# Patient Record
Sex: Female | Born: 1980 | Race: Black or African American | Hispanic: No | Smoking: Never smoker
Health system: Southern US, Community
[De-identification: ages and names within clinical notes are randomized; demographics above are authoritative.]

## PROBLEM LIST (undated history)

## (undated) ENCOUNTER — Inpatient Hospital Stay (HOSPITAL_COMMUNITY): Payer: Self-pay

## (undated) DIAGNOSIS — Z6841 Body Mass Index (BMI) 40.0 and over, adult: Secondary | ICD-10-CM

## (undated) DIAGNOSIS — M545 Low back pain, unspecified: Secondary | ICD-10-CM

## (undated) DIAGNOSIS — M1990 Unspecified osteoarthritis, unspecified site: Secondary | ICD-10-CM

## (undated) DIAGNOSIS — Z3A12 12 weeks gestation of pregnancy: Secondary | ICD-10-CM

## (undated) DIAGNOSIS — R7303 Prediabetes: Secondary | ICD-10-CM

## (undated) DIAGNOSIS — G8929 Other chronic pain: Secondary | ICD-10-CM

## (undated) DIAGNOSIS — L03115 Cellulitis of right lower limb: Secondary | ICD-10-CM

## (undated) HISTORY — PX: DILATION AND CURETTAGE OF UTERUS: SHX78

## (undated) HISTORY — DX: Prediabetes: R73.03

## (undated) HISTORY — PX: TUBAL LIGATION: SHX77

## (undated) HISTORY — DX: 12 weeks gestation of pregnancy: Z3A.12

## (undated) HISTORY — DX: Cellulitis of right lower limb: L03.115

---
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
## 1999-05-18 ENCOUNTER — Emergency Department (HOSPITAL_COMMUNITY): Admission: EM | Admit: 1999-05-18 | Discharge: 1999-05-18 | Payer: Self-pay | Admitting: *Deleted

## 1999-05-18 ENCOUNTER — Encounter: Payer: Self-pay | Admitting: *Deleted

## 1999-07-22 ENCOUNTER — Emergency Department (HOSPITAL_COMMUNITY): Admission: EM | Admit: 1999-07-22 | Discharge: 1999-07-22 | Payer: Self-pay | Admitting: Emergency Medicine

## 1999-07-22 ENCOUNTER — Encounter: Payer: Self-pay | Admitting: Emergency Medicine

## 1999-09-23 ENCOUNTER — Emergency Department (HOSPITAL_COMMUNITY): Admission: EM | Admit: 1999-09-23 | Discharge: 1999-09-23 | Payer: Self-pay | Admitting: Emergency Medicine

## 2000-01-17 ENCOUNTER — Emergency Department (HOSPITAL_COMMUNITY): Admission: EM | Admit: 2000-01-17 | Discharge: 2000-01-18 | Payer: Self-pay | Admitting: Emergency Medicine

## 2000-06-18 ENCOUNTER — Emergency Department (HOSPITAL_COMMUNITY): Admission: EM | Admit: 2000-06-18 | Discharge: 2000-06-19 | Payer: Self-pay | Admitting: Emergency Medicine

## 2000-08-28 ENCOUNTER — Emergency Department (HOSPITAL_COMMUNITY): Admission: EM | Admit: 2000-08-28 | Discharge: 2000-08-29 | Payer: Self-pay | Admitting: Emergency Medicine

## 2002-02-24 ENCOUNTER — Emergency Department (HOSPITAL_COMMUNITY): Admission: EM | Admit: 2002-02-24 | Discharge: 2002-02-24 | Payer: Self-pay | Admitting: Emergency Medicine

## 2003-01-19 ENCOUNTER — Emergency Department (HOSPITAL_COMMUNITY): Admission: EM | Admit: 2003-01-19 | Discharge: 2003-01-20 | Payer: Self-pay | Admitting: Emergency Medicine

## 2006-07-20 ENCOUNTER — Emergency Department (HOSPITAL_COMMUNITY): Admission: EM | Admit: 2006-07-20 | Discharge: 2006-07-20 | Payer: Self-pay | Admitting: Emergency Medicine

## 2006-11-27 ENCOUNTER — Emergency Department (HOSPITAL_COMMUNITY): Admission: EM | Admit: 2006-11-27 | Discharge: 2006-11-27 | Payer: Self-pay | Admitting: Emergency Medicine

## 2007-11-03 ENCOUNTER — Emergency Department (HOSPITAL_COMMUNITY): Admission: EM | Admit: 2007-11-03 | Discharge: 2007-11-04 | Payer: Self-pay | Admitting: Emergency Medicine

## 2007-11-09 ENCOUNTER — Inpatient Hospital Stay (HOSPITAL_COMMUNITY): Admission: AD | Admit: 2007-11-09 | Discharge: 2007-11-09 | Payer: Self-pay | Admitting: Gynecology

## 2007-12-06 ENCOUNTER — Ambulatory Visit: Payer: Self-pay | Admitting: Obstetrics & Gynecology

## 2007-12-06 ENCOUNTER — Ambulatory Visit (HOSPITAL_COMMUNITY): Admission: AD | Admit: 2007-12-06 | Discharge: 2007-12-07 | Payer: Self-pay | Admitting: Obstetrics & Gynecology

## 2007-12-07 ENCOUNTER — Encounter: Payer: Self-pay | Admitting: Obstetrics & Gynecology

## 2007-12-10 ENCOUNTER — Encounter: Payer: Self-pay | Admitting: Obstetrics & Gynecology

## 2007-12-10 ENCOUNTER — Inpatient Hospital Stay (HOSPITAL_COMMUNITY): Admission: AD | Admit: 2007-12-10 | Discharge: 2007-12-10 | Payer: Self-pay | Admitting: Obstetrics & Gynecology

## 2008-06-19 ENCOUNTER — Inpatient Hospital Stay (HOSPITAL_COMMUNITY): Admission: AD | Admit: 2008-06-19 | Discharge: 2008-06-20 | Payer: Self-pay | Admitting: Obstetrics & Gynecology

## 2008-08-03 ENCOUNTER — Ambulatory Visit (HOSPITAL_COMMUNITY): Admission: RE | Admit: 2008-08-03 | Discharge: 2008-08-03 | Payer: Self-pay | Admitting: Obstetrics

## 2008-09-12 ENCOUNTER — Inpatient Hospital Stay (HOSPITAL_COMMUNITY): Admission: AD | Admit: 2008-09-12 | Discharge: 2008-09-12 | Payer: Self-pay | Admitting: Obstetrics

## 2008-10-14 ENCOUNTER — Ambulatory Visit (HOSPITAL_COMMUNITY): Admission: RE | Admit: 2008-10-14 | Discharge: 2008-10-14 | Payer: Self-pay | Admitting: Obstetrics

## 2008-11-04 ENCOUNTER — Ambulatory Visit (HOSPITAL_COMMUNITY): Admission: RE | Admit: 2008-11-04 | Discharge: 2008-11-04 | Payer: Self-pay | Admitting: Obstetrics

## 2008-11-18 ENCOUNTER — Ambulatory Visit: Payer: Self-pay | Admitting: Advanced Practice Midwife

## 2008-11-18 ENCOUNTER — Inpatient Hospital Stay (HOSPITAL_COMMUNITY): Admission: AD | Admit: 2008-11-18 | Discharge: 2008-11-18 | Payer: Self-pay | Admitting: Obstetrics

## 2008-12-15 ENCOUNTER — Ambulatory Visit (HOSPITAL_COMMUNITY): Admission: RE | Admit: 2008-12-15 | Discharge: 2008-12-15 | Payer: Self-pay | Admitting: Obstetrics

## 2008-12-24 ENCOUNTER — Encounter: Payer: Self-pay | Admitting: Obstetrics

## 2008-12-24 ENCOUNTER — Inpatient Hospital Stay (HOSPITAL_COMMUNITY): Admission: RE | Admit: 2008-12-24 | Discharge: 2008-12-27 | Payer: Self-pay | Admitting: Obstetrics

## 2009-01-01 ENCOUNTER — Inpatient Hospital Stay (HOSPITAL_COMMUNITY): Admission: AD | Admit: 2009-01-01 | Discharge: 2009-01-01 | Payer: Self-pay | Admitting: Obstetrics

## 2009-01-06 ENCOUNTER — Inpatient Hospital Stay (HOSPITAL_COMMUNITY): Admission: AD | Admit: 2009-01-06 | Discharge: 2009-01-14 | Payer: Self-pay | Admitting: Obstetrics

## 2009-06-11 ENCOUNTER — Other Ambulatory Visit: Payer: Self-pay | Admitting: Obstetrics & Gynecology

## 2009-06-11 ENCOUNTER — Emergency Department (HOSPITAL_COMMUNITY): Admission: EM | Admit: 2009-06-11 | Discharge: 2009-06-11 | Payer: Self-pay | Admitting: Emergency Medicine

## 2009-08-05 ENCOUNTER — Emergency Department (HOSPITAL_COMMUNITY): Admission: EM | Admit: 2009-08-05 | Discharge: 2009-08-05 | Payer: Self-pay | Admitting: Family Medicine

## 2009-11-13 ENCOUNTER — Emergency Department (HOSPITAL_COMMUNITY): Admission: EM | Admit: 2009-11-13 | Discharge: 2009-11-13 | Payer: Self-pay | Admitting: Emergency Medicine

## 2010-04-02 ENCOUNTER — Emergency Department (HOSPITAL_COMMUNITY)
Admission: EM | Admit: 2010-04-02 | Discharge: 2010-04-03 | Payer: Self-pay | Source: Home / Self Care | Admitting: Emergency Medicine

## 2010-06-16 LAB — COMPREHENSIVE METABOLIC PANEL
ALT: 13 U/L (ref 0–35)
AST: 19 U/L (ref 0–37)
Albumin: 3.7 g/dL (ref 3.5–5.2)
CO2: 27 mEq/L (ref 19–32)
Calcium: 9.2 mg/dL (ref 8.4–10.5)
Chloride: 109 mEq/L (ref 96–112)
GFR calc Af Amer: >60 mL/min (ref >60)
Glucose, Bld: 87 mg/dL (ref 70–99)
Sodium: 143 mEq/L (ref 135–145)
Total Protein: 7.4 g/dL (ref 6.0–8.3)

## 2010-06-16 LAB — URINALYSIS, ROUTINE W REFLEX MICROSCOPIC
Ketones, ur: NEGATIVE mg/dL
Leukocytes, UA: NEGATIVE
Nitrite: NEGATIVE
Protein, ur: 30 mg/dL — AB
Specific Gravity, Urine: 1.017 (ref 1.005–1.030)

## 2010-06-16 LAB — CBC
HCT: 41.6 % (ref 36.0–46.0)
Hemoglobin: 13.7 g/dL (ref 12.0–15.0)
MCH: 27.6 pg (ref 26.0–34.0)
MCHC: 32.9 g/dL (ref 30.0–36.0)
MCV: 83.9 fL (ref 78.0–100.0)
Platelets: 212 K/uL (ref 150–400)
RBC: 4.96 MIL/uL (ref 3.87–5.11)
RDW: 13.3 % (ref 11.5–15.5)
WBC: 6.4 10*3/uL (ref 4.0–10.5)

## 2010-06-16 LAB — COMPREHENSIVE METABOLIC PANEL WITH GFR
Alkaline Phosphatase: 47 U/L (ref 39–117)
BUN: 7 mg/dL (ref 6–23)
Creatinine, Ser: 1.19 mg/dL (ref 0.4–1.2)
GFR calc non Af Amer: 53 mL/min — ABNORMAL LOW (ref >60)
Potassium: 4.1 meq/L (ref 3.5–5.1)
Total Bilirubin: 0.6 mg/dL (ref 0.3–1.2)

## 2010-06-16 LAB — DIFFERENTIAL
Basophils Absolute: 0 K/uL (ref 0.0–0.1)
Basophils Relative: 0 % (ref 0–1)
Eosinophils Absolute: 0.1 K/uL (ref 0.0–0.7)
Eosinophils Relative: 1 % (ref 0–5)
Lymphocytes Relative: 29 % (ref 12–46)
Lymphs Abs: 1.8 K/uL (ref 0.7–4.0)
Monocytes Absolute: 0.4 10*3/uL (ref 0.1–1.0)
Monocytes Relative: 6 % (ref 3–12)
Neutro Abs: 4.1 K/uL (ref 1.7–7.7)
Neutrophils Relative %: 64 % (ref 43–77)

## 2010-06-16 LAB — LIPASE, BLOOD: Lipase: 25 U/L (ref 11–59)

## 2010-06-16 LAB — POCT PREGNANCY, URINE: Preg Test, Ur: NEGATIVE

## 2010-06-20 LAB — POCT URINALYSIS DIP (DEVICE)
Bilirubin Urine: NEGATIVE
Ketones, ur: NEGATIVE mg/dL
Protein, ur: 30 mg/dL — AB
Specific Gravity, Urine: 1.015 (ref 1.005–1.030)
pH: 7.5 (ref 5.0–8.0)

## 2010-06-20 LAB — POCT PREGNANCY, URINE: Preg Test, Ur: NEGATIVE

## 2010-06-23 LAB — CBC
HCT: 41.5 % (ref 36.0–46.0)
Hemoglobin: 13.1 g/dL (ref 12.0–15.0)
RBC: 4.88 MIL/uL (ref 3.87–5.11)
RDW: 13 % (ref 11.5–15.5)

## 2010-06-23 LAB — DIFFERENTIAL
Basophils Absolute: 0 10*3/uL (ref 0.0–0.1)
Eosinophils Relative: 1 % (ref 0–5)
Lymphocytes Relative: 14 % (ref 12–46)
Monocytes Absolute: 0.4 10*3/uL (ref 0.1–1.0)
Monocytes Relative: 4 % (ref 3–12)

## 2010-06-23 LAB — POCT I-STAT, CHEM 8
BUN: 7 mg/dL (ref 6–23)
Calcium, Ion: 1.16 mmol/L (ref 1.12–1.32)
Chloride: 107 mEq/L (ref 96–112)
Creatinine, Ser: 0.9 mg/dL (ref 0.4–1.2)
Glucose, Bld: 110 mg/dL — ABNORMAL HIGH (ref 70–99)
TCO2: 27 mmol/L (ref 0–100)

## 2010-06-25 LAB — POCT PREGNANCY, URINE: Preg Test, Ur: NEGATIVE

## 2010-07-06 LAB — CBC
HCT: 33.3 % — ABNORMAL LOW (ref 36.0–46.0)
HCT: 39.9 % (ref 36.0–46.0)
Hemoglobin: 11.1 g/dL — ABNORMAL LOW (ref 12.0–15.0)
Hemoglobin: 12.8 g/dL (ref 12.0–15.0)
MCHC: 32.9 g/dL (ref 30.0–36.0)
MCHC: 33.3 g/dL (ref 30.0–36.0)
MCV: 85.9 fL (ref 78.0–100.0)
Platelets: 245 10*3/uL (ref 150–400)
Platelets: 246 10*3/uL (ref 150–400)
Platelets: 297 10*3/uL (ref 150–400)
RDW: 13.6 % (ref 11.5–15.5)
RDW: 13.8 % (ref 11.5–15.5)
RDW: 13.8 % (ref 11.5–15.5)
WBC: 12.1 10*3/uL — ABNORMAL HIGH (ref 4.0–10.5)

## 2010-07-06 LAB — COMPREHENSIVE METABOLIC PANEL
ALT: 15 U/L (ref 0–35)
Albumin: 2.8 g/dL — ABNORMAL LOW (ref 3.5–5.2)
Albumin: 3.3 g/dL — ABNORMAL LOW (ref 3.5–5.2)
Alkaline Phosphatase: 67 U/L (ref 39–117)
Alkaline Phosphatase: 76 U/L (ref 39–117)
BUN: 5 mg/dL — ABNORMAL LOW (ref 6–23)
BUN: 8 mg/dL (ref 6–23)
Calcium: 8.4 mg/dL (ref 8.4–10.5)
Chloride: 109 mEq/L (ref 96–112)
Creatinine, Ser: 0.75 mg/dL (ref 0.4–1.2)
Glucose, Bld: 71 mg/dL (ref 70–99)
Glucose, Bld: 86 mg/dL (ref 70–99)
Potassium: 3.8 mEq/L (ref 3.5–5.1)
Sodium: 142 mEq/L (ref 135–145)
Total Bilirubin: 0.6 mg/dL (ref 0.3–1.2)
Total Protein: 5.6 g/dL — ABNORMAL LOW (ref 6.0–8.3)
Total Protein: 7.5 g/dL (ref 6.0–8.3)

## 2010-07-06 LAB — CULTURE, BLOOD (ROUTINE X 2): Culture: NO GROWTH

## 2010-07-06 LAB — BASIC METABOLIC PANEL
CO2: 26 mEq/L (ref 19–32)
Chloride: 109 mEq/L (ref 96–112)
GFR calc non Af Amer: >60 mL/min (ref >60)
Glucose, Bld: 87 mg/dL (ref 70–99)
Potassium: 4 mEq/L (ref 3.5–5.1)
Sodium: 141 mEq/L (ref 135–145)

## 2010-07-06 LAB — DIC (DISSEMINATED INTRAVASCULAR COAGULATION)PANEL
D-Dimer, Quant: 1.36 ug/mL-FEU — ABNORMAL HIGH (ref 0.00–0.48)
Prothrombin Time: 14.1 seconds (ref 11.6–15.2)
aPTT: 38 seconds — ABNORMAL HIGH (ref 24–37)

## 2010-07-06 LAB — URIC ACID: Uric Acid, Serum: 6.3 mg/dL (ref 2.4–7.0)

## 2010-07-06 LAB — LACTATE DEHYDROGENASE: LDH: 183 U/L (ref 94–250)

## 2010-07-07 LAB — CBC
HCT: 36.3 % (ref 36.0–46.0)
Hemoglobin: 12.2 g/dL (ref 12.0–15.0)
MCHC: 33.5 g/dL (ref 30.0–36.0)
MCHC: 33.6 g/dL (ref 30.0–36.0)
MCV: 84.9 fL (ref 78.0–100.0)
MCV: 85.6 fL (ref 78.0–100.0)
RBC: 3.89 MIL/uL (ref 3.87–5.11)
RDW: 13.8 % (ref 11.5–15.5)
RDW: 14 % (ref 11.5–15.5)

## 2010-07-08 LAB — CBC
MCV: 84.8 fL (ref 78.0–100.0)
Platelets: 180 10*3/uL (ref 150–400)
WBC: 9.4 10*3/uL (ref 4.0–10.5)

## 2010-07-08 LAB — COMPREHENSIVE METABOLIC PANEL
AST: 21 U/L (ref 0–37)
Albumin: 3.1 g/dL — ABNORMAL LOW (ref 3.5–5.2)
Calcium: 8.9 mg/dL (ref 8.4–10.5)
Chloride: 100 mEq/L (ref 96–112)
Creatinine, Ser: 0.6 mg/dL (ref 0.4–1.2)
GFR calc Af Amer: >60 mL/min (ref >60)

## 2010-07-10 LAB — WET PREP, GENITAL: Trich, Wet Prep: NONE SEEN

## 2010-07-10 LAB — URINALYSIS, ROUTINE W REFLEX MICROSCOPIC
Bilirubin Urine: NEGATIVE
Hgb urine dipstick: NEGATIVE
Specific Gravity, Urine: 1.025 (ref 1.005–1.030)
pH: 6 (ref 5.0–8.0)

## 2010-07-13 LAB — URINE CULTURE: Colony Count: NO GROWTH

## 2010-07-13 LAB — COMPREHENSIVE METABOLIC PANEL
AST: 15 U/L (ref 0–37)
Albumin: 3.2 g/dL — ABNORMAL LOW (ref 3.5–5.2)
Chloride: 107 mEq/L (ref 96–112)
Creatinine, Ser: 0.61 mg/dL (ref 0.4–1.2)
GFR calc Af Amer: >60 mL/min (ref >60)
Total Bilirubin: 0.4 mg/dL (ref 0.3–1.2)
Total Protein: 6.9 g/dL (ref 6.0–8.3)

## 2010-07-13 LAB — CBC
MCV: 83.9 fL (ref 78.0–100.0)
Platelets: 193 10*3/uL (ref 150–400)
RDW: 13.2 % (ref 11.5–15.5)
WBC: 8.8 10*3/uL (ref 4.0–10.5)

## 2010-07-13 LAB — URINALYSIS, ROUTINE W REFLEX MICROSCOPIC
Nitrite: NEGATIVE
Specific Gravity, Urine: 1.025 (ref 1.005–1.030)
Urobilinogen, UA: 1 mg/dL (ref 0.0–1.0)

## 2010-07-13 LAB — URINE MICROSCOPIC-ADD ON

## 2010-07-13 LAB — GC/CHLAMYDIA PROBE AMP, GENITAL: GC Probe Amp, Genital: NEGATIVE

## 2010-07-13 LAB — WET PREP, GENITAL: Clue Cells Wet Prep HPF POC: NONE SEEN

## 2010-08-15 NOTE — Op Note (Signed)
[name], [name] NO.:  [id number]   MEDICAL RECORD NO.:  [id number]          PATIENT TYPE:  AMB   LOCATION:  SDC                           FACILITY:  [facility]   PHYSICIAN:  Norton Blizzard, MD    DATE OF BIRTH:  1981/02/06   DATE OF PROCEDURE:  12/07/2007  DATE OF DISCHARGE:                               OPERATIVE REPORT   PREOPERATIVE DIAGNOSES:  1. Incomplete miscarriage on November 27, 2007.  2. Retained products of conception on ultrasound  3. Fever   POSTOPERATIVE DIAGNOSES:  1. Incomplete miscarriage on November 27, 2007.  2. Retained products of conception on ultrasound  3. Fever   PROCEDURE:  Dilation and evacuation.   SURGEON:  Norton Blizzard, MD   ANESTHESIA:  Spinal and paracervical block using 0.25% Marcaine with  epinephrine.   IV FLUIDS:  500 mL.   ESTIMATED BLOOD LOSS:  Minimal.   FINDINGS:  1. Ten-week size anteverted uterus.  2. Small amount of products of conception.   SPECIMENS:  Products of conception sent to pathology.   COMPLICATIONS:  None.   PROCEDURE DETAILS:  The patient is a 30 year old G29, P2-0-2-2 status  post recent miscarriage on November 27, 2007.  She was seen in [facility] today  for a chief complaint of vaginal bleeding and abdominal pain.  On  evaluation, the patient was noted to have temperature of 100.5, other  vital signs were stable and she had moderate abdominal pain. A pelvic  ultrasound showed 3.9 cm thickness of echogenic material within the  endometrium with anterior flow suspicious for retained products of  conception, no evidence of gestational sac or fetal pole.  Given the  patient's fever and ultrasound findings, she was counseled regarding  dilation and evacuation.  The risk of surgery including bleeding,  infection, injury to surrounding organs, need for additional procedures  and risk for retained products were explained to the patient and written  informed consent was obtained.   The patient was taken  to the operating room where spinal anesthesia was  administered and found to be adequate.  She was placed in dorsal  lithotomy position, and prepped and draped in sterile manner.  A vaginal  speculum was placed and her cervix was grasped with a tenaculum.  A  paracervical block was administered and her cervix was dilated to  accommodate an 8-mm curved curette.  The curette was gently advanced  into the uterine fundus and a suction device was activated.  The curette  was slowly rotated to clear the uterus of all contents.  A small amount  of POCs were noted.  Sharp curettage was done, which confirmed that no  further products could be obtained.  The 8-mm curette was reintroduced,  which yielded a small amount of clots and tissue.  Small amount of  bleeding was noted at the end of the case.  All instruments were then  removed from the patient's pelvis.  The patient tolerated the procedure  well.  Sponge and instrument counts were correct x 3.  She was taken to  the recovery room  in stable condition.  Given that only a scant amount  of products of conception were obtained, the patient will be sent home  with a prescription for Methergine 0.2 mg p.o. t.i.d. for the next 3  days to see this would help if there are any further retained products.  The patient will also be sent home with Doxycycline 100 b.i.d. for 7  days given her fever.  The patient will be given the number for the GYN  Clinic to make an appointment for followup in the next  two to three  weeks.      Norton Blizzard, MD  Electronically Signed     UAD/MEDQ  D:  12/07/2007  T:  12/07/2007  Job:  [id number]

## 2010-12-29 LAB — URINALYSIS, ROUTINE W REFLEX MICROSCOPIC
Bilirubin Urine: NEGATIVE
Glucose, UA: NEGATIVE
Hgb urine dipstick: NEGATIVE
Specific Gravity, Urine: 1.01
Urobilinogen, UA: 0.2
pH: 7

## 2010-12-29 LAB — POCT PREGNANCY, URINE: Preg Test, Ur: POSITIVE

## 2010-12-29 LAB — GC/CHLAMYDIA PROBE AMP, GENITAL
Chlamydia, DNA Probe: NEGATIVE
GC Probe Amp, Genital: NEGATIVE

## 2010-12-29 LAB — CBC
MCHC: 33
MCV: 83.8
Platelets: 206
RDW: 13.5

## 2010-12-29 LAB — HCG, QUANTITATIVE, PREGNANCY: hCG, Beta Chain, Quant, S: 57045 — ABNORMAL HIGH

## 2010-12-29 LAB — WET PREP, GENITAL
Clue Cells Wet Prep HPF POC: NONE SEEN
Trich, Wet Prep: NONE SEEN
Yeast Wet Prep HPF POC: NONE SEEN

## 2011-01-03 LAB — CBC
HCT: 37.2
Hemoglobin: 12.1
Hemoglobin: 13
MCHC: 32.6
MCV: 84.2
Platelets: 204
RBC: 4.42
RDW: 13.5
WBC: 7

## 2011-01-03 LAB — DIFFERENTIAL
Basophils Relative: 0
Eosinophils Relative: 1
Monocytes Absolute: 0.3
Monocytes Relative: 5
Neutro Abs: 6.2

## 2011-01-03 LAB — GC/CHLAMYDIA PROBE AMP, GENITAL: Chlamydia, DNA Probe: NEGATIVE

## 2011-01-03 LAB — WET PREP, GENITAL: Clue Cells Wet Prep HPF POC: NONE SEEN

## 2011-01-12 LAB — POCT RAPID STREP A: Streptococcus, Group A Screen (Direct): POSITIVE — AB

## 2011-03-11 ENCOUNTER — Encounter: Payer: Self-pay | Admitting: *Deleted

## 2011-03-11 ENCOUNTER — Emergency Department (HOSPITAL_COMMUNITY)
Admission: EM | Admit: 2011-03-11 | Discharge: 2011-03-11 | Disposition: A | Discharge status: Home / Self Care | Payer: Medicaid Other | Source: Home / Self Care

## 2011-03-11 DIAGNOSIS — J111 Influenza due to unidentified influenza virus with other respiratory manifestations: Secondary | ICD-10-CM

## 2011-03-11 MED ORDER — GUAIFENESIN-CODEINE 100-10 MG/5ML PO SYRP: 5.0000 mL | ORAL_SOLUTION | Freq: Three times a day (TID) | ORAL | Status: AC | PRN

## 2011-03-11 NOTE — ED Notes (Signed)
Pt with symptoms of cough/congestion/fever/aching all over/headache/chills onset Friday

## 2011-03-11 NOTE — ED Provider Notes (Signed)
History     CSN: [id number] Arrival date & time: 03/11/2011  4:00 PM   None     Chief Complaint  Patient presents with  . Cough  . Nasal Congestion  . Fever  . Chills  . Headache  . 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 Fever, sore throat, nasal congestion, cough, body aches.   Patient is a 30 y.o. female presenting with cough, fever, and headaches. The history is provided by the patient.  Cough The current episode started 2 days ago. The problem occurs every few minutes. The problem has not changed since onset.The cough is non-productive. The maximum temperature recorded prior to her arrival was 102 to 102.9 F. Associated symptoms include chills, headaches, rhinorrhea, sore throat and myalgias. Pertinent negatives include no ear pain, no shortness of breath and no wheezing. Treatments tried: otc cold medicines. The treatment provided no relief.  Fever Primary symptoms of the febrile illness include fever, headaches, cough and myalgias. Primary symptoms do not include wheezing or shortness of breath.  Headache The primary symptoms include headaches and fever.    History reviewed. No pertinent past medical history.  History reviewed. No pertinent past surgical history.  History reviewed. No pertinent family history.  History  Substance Use Topics  . Smoking status: Never Smoker   . Smokeless tobacco: Not on file  . Alcohol Use: No    OB History    Grav Para Term Preterm Abortions TAB SAB Ect Mult Living                  Review of Systems  Constitutional: Positive for fever and chills.  HENT: Positive for congestion, sore throat, rhinorrhea and postnasal drip. Negative for ear pain.   Respiratory: Positive for cough. Negative for shortness of breath and wheezing.   Musculoskeletal:  Positive for myalgias.  Neurological: Positive for headaches.    Allergies  Penicillins  Home Medications   Current Outpatient Rx  Name Route Sig Dispense Refill  . GUAIFENESIN ER 600 MG PO TB12 Oral Take 1,200 mg by mouth 2 (two) times daily.      . IBUPROFEN 200 MG PO TABS Oral Take 200 mg by mouth every 6 (six) hours as needed.      Juanita Laster COLD & COUGH PO Oral Take by mouth.      . GUAIFENESIN-CODEINE 100-10 MG/5ML PO SYRP Oral Take 5 mLs by mouth 3 (three) times daily as needed for cough. 120 mL 0    BP 124/80  Pulse 91  Temp(Src) 99 F (37.2 C) (Oral)  Resp 20  SpO2 98%  Physical Exam  Constitutional: She appears well-developed and well-nourished. She appears ill.       Morbidly obese  HENT:  Right Ear: Tympanic membrane, external ear and ear canal normal.  Left Ear: Tympanic membrane, external ear and ear canal normal.  Nose: Mucosal edema and rhinorrhea present.  Mouth/Throat: Posterior oropharyngeal erythema present. No oropharyngeal exudate or posterior oropharyngeal edema.       Mild erythema pharynx  Cardiovascular: Normal rate and regular rhythm.   Pulmonary/Chest: Effort normal and breath sounds normal.  Lymphadenopathy:       Head (right side): No submandibular adenopathy present.       Head (left side): No submandibular adenopathy present.    She has no cervical adenopathy.  Skin: Skin is warm and  dry.    ED Course  Procedures (including critical care time)  Labs Reviewed - No data to display No results found.   1. Influenza     Pt sx c/w flu.   MDM          Cathlyn Parsons, NP 03/11/11 1714

## 2011-03-11 NOTE — ED Provider Notes (Signed)
Medical screening examination/treatment/procedure(s) were performed by non-physician practitioner and as supervising physician I was immediately available for consultation/collaboration.  Lavern Maslow   Teaghan Formica, MD 03/11/11 1845 

## 2012-02-06 ENCOUNTER — Other Ambulatory Visit: Payer: Self-pay | Admitting: Orthopedic Surgery

## 2012-02-06 DIAGNOSIS — M549 Dorsalgia, unspecified: Secondary | ICD-10-CM

## 2012-02-11 ENCOUNTER — Other Ambulatory Visit: Payer: Self-pay

## 2012-02-12 ENCOUNTER — Ambulatory Visit
Admission: RE | Admit: 2012-02-12 | Discharge: 2012-02-12 | Disposition: A | Discharge status: Home / Self Care | Payer: Medicaid Other | Source: Ambulatory Visit | Attending: Orthopedic Surgery | Admitting: Orthopedic Surgery

## 2012-02-12 DIAGNOSIS — M549 Dorsalgia, unspecified: Secondary | ICD-10-CM

## 2012-02-13 ENCOUNTER — Ambulatory Visit: Payer: Medicaid Other | Attending: Orthopedic Surgery | Admitting: Rehabilitative and Restorative Service Providers"

## 2012-05-06 ENCOUNTER — Other Ambulatory Visit: Payer: Self-pay | Admitting: Obstetrics

## 2012-05-16 ENCOUNTER — Other Ambulatory Visit: Payer: Medicaid Other

## 2012-05-19 ENCOUNTER — Ambulatory Visit
Admission: RE | Admit: 2012-05-19 | Discharge: 2012-05-19 | Disposition: A | Discharge status: Home / Self Care | Payer: Medicaid Other | Source: Ambulatory Visit | Attending: Obstetrics | Admitting: Obstetrics

## 2012-08-18 ENCOUNTER — Encounter (HOSPITAL_COMMUNITY): Payer: Self-pay | Admitting: Emergency Medicine

## 2012-08-18 ENCOUNTER — Emergency Department (HOSPITAL_COMMUNITY)
Admission: EM | Admit: 2012-08-18 | Discharge: 2012-08-18 | Disposition: A | Discharge status: Home / Self Care | Payer: Medicaid Other | Source: Home / Self Care | Attending: Emergency Medicine | Admitting: Emergency Medicine

## 2012-08-18 DIAGNOSIS — L509 Urticaria, unspecified: Secondary | ICD-10-CM

## 2012-08-18 MED ORDER — FEXOFENADINE HCL 180 MG PO TABS: 180.0000 mg | ORAL_TABLET | Freq: Every day | ORAL | Status: DC

## 2012-08-18 MED ORDER — METHYLPREDNISOLONE ACETATE 80 MG/ML IJ SUSP
INTRAMUSCULAR | Status: AC
  Filled 2012-08-18: qty 1

## 2012-08-18 MED ORDER — DIPHENHYDRAMINE HCL 50 MG/ML IJ SOLN
25.0000 mg | Freq: Once | INTRAMUSCULAR | Status: AC
  Administered 2012-08-18: 25 mg via INTRAMUSCULAR

## 2012-08-18 MED ORDER — DIPHENHYDRAMINE HCL 50 MG/ML IJ SOLN
INTRAMUSCULAR | Status: AC
  Filled 2012-08-18: qty 1

## 2012-08-18 MED ORDER — METHYLPREDNISOLONE ACETATE 80 MG/ML IJ SUSP
80.0000 mg | Freq: Once | INTRAMUSCULAR | Status: AC
  Administered 2012-08-18: 80 mg via INTRAMUSCULAR

## 2012-08-18 MED ORDER — PREDNISONE 10 MG PO TABS: ORAL_TABLET | ORAL | Status: DC

## 2012-08-18 MED ORDER — CIMETIDINE 400 MG PO TABS: 400.0000 mg | ORAL_TABLET | Freq: Two times a day (BID) | ORAL | Status: DC

## 2012-08-18 NOTE — ED Notes (Signed)
Pt c/o rash onset this am; did not have any rash last night and when she awoke, noticed the rash Rash on arms, legs, upper back, face; rash spreading Denies cold sx, f/v/n/d  She is alert and oriented w/no signs of acute distress.

## 2012-08-18 NOTE — ED Provider Notes (Signed)
Chief Complaint:   Chief Complaint  Patient presents with  . Rash    History of Present Illness:   [name] is a 32 year old female who has had a history since this morning of a very itchy rash at first broke out on her arms then spread to her neck, her face, her back, and her posterior thighs. She cannot think of any allergens that she's come in contact with such as changes in soaps, detergents, washing powders, dryer sheets, or fabric softeners. No exposure to chemicals at work, chemicals at home, cosmetics, plants, or animals. No one else around her has had a similar rash. She denies any new foods or new medications. She's felt well in every way and denies any fever, chills, headache, stiff neck, URI symptoms, or GI symptoms. She's had no difficulty breathing, wheezing, chest tightness, shortness of breath, or swelling her lips, tongue, or throat. No insect bites or stings.  Review of Systems:  Other than noted above, the patient denies any of the following symptoms: Systemic:  No fever, chills, sweats, weight loss, or fatigue. ENT:  No nasal congestion, rhinorrhea, sore throat, swelling of lips, tongue or throat. Resp:  No cough, wheezing, or shortness of breath. Skin:  No rash, itching, nodules, or suspicious lesions.  PMFSH:  Past medical history, family history, social history, meds, and allergies were reviewed. She is allergic to penicillin. She has an Implanad.  Physical Exam:   Vital signs:  BP 163/73  Pulse 78  Temp(Src) 98.9 F (37.2 C) (Oral)  Resp 22  SpO2 100% Gen:  Alert, oriented, in no distress. ENT:  Pharynx clear, no intraoral lesions, moist mucous membranes. Lungs:  Clear to auscultation. Skin:  She has scattered, raised, erythematous papules on her arms, but not on the palms of the hands or soles of the feet, her face, neck, upper back, and posterior thighs. These range in size from a couple of millimeters in size to several centimeters, especially on the left  posterior thigh. None of these were vesicular.  Course in Urgent Care Center:   Given Benadryl 25 mg IM and Depo-Medrol 80 mg IM.  Assessment:  The encounter diagnosis was Urticaria.  The causative allergen remains unknown.  Plan:   1.  The following meds were prescribed:   Discharge Medication List as of 08/18/2012  6:39 PM    START taking these medications   Details  cimetidine (TAGAMET) 400 MG tablet Take 1 tablet (400 mg total) by mouth 2 (two) times daily., Starting 08/18/2012, Until Discontinued, Normal    fexofenadine (ALLEGRA) 180 MG tablet Take 1 tablet (180 mg total) by mouth daily., Starting 08/18/2012, Until Discontinued, Normal    predniSONE (DELTASONE) 10 MG tablet Take 4 tabs daily for 4 days, 3 tabs daily for 4 days, 2 tabs daily for 4 days, then 1 tab daily for 4 days., Normal       2.  The patient was instructed in symptomatic care and handouts were given. 3.  The patient was told to return if becoming worse in any way, if no better in 3 or 4 days, and given some red flag symptoms such as difficulty breathing or swelling of her lips, tongue, or throat that would indicate earlier return. 4.  Follow up with Dr. Para Skeans if no better in one week.     Reuben Likes, MD 08/18/12 [phone]

## 2012-08-28 ENCOUNTER — Encounter: Payer: Self-pay | Admitting: Obstetrics

## 2012-08-28 ENCOUNTER — Ambulatory Visit (INDEPENDENT_AMBULATORY_CARE_PROVIDER_SITE_OTHER): Payer: Medicaid Other | Admitting: Obstetrics

## 2012-08-28 VITALS — BP 129/84 | HR 85 | Temp 98.7°F | Ht 63.0 in | Wt 347.0 lb

## 2012-08-28 DIAGNOSIS — B373 Candidiasis of vulva and vagina: Secondary | ICD-10-CM

## 2012-08-28 DIAGNOSIS — K219 Gastro-esophageal reflux disease without esophagitis: Secondary | ICD-10-CM | POA: Insufficient documentation

## 2012-08-28 MED ORDER — BUTOCONAZOLE NITRATE (1 DOSE) 2 % VA CREA: 1.0000 | TOPICAL_CREAM | Freq: Every evening | VAGINAL | Status: DC

## 2012-08-28 MED ORDER — ESOMEPRAZOLE MAGNESIUM 10 MG PO PACK: 40.0000 mg | PACK | Freq: Every day | ORAL | Status: DC

## 2012-08-28 NOTE — Progress Notes (Signed)
Subjective:     [name] is a 32 y.o. female here for a routine exam.  Current complaints: vaginal discharge. Pt states she has been having vaginal discharge with itching and burning for about 4 days. Pt denies any odor.   Personal health questionnaire reviewed: yes.   Gynecologic History No LMP recorded. Patient has had an implant. Contraception: Nexplanon Last Pap: 2013. Results were: normal Last mammogram: 2014. Results were: Pt states she has to go back for a 6 month follow up because of possible fibrositic cyst.  Obstetric History OB History   Grav Para Term Preterm Abortions TAB SAB Ect Mult Living                   The following portions of the patient's history were reviewed and updated as appropriate: allergies, current medications, past family history, past medical history, past social history, past surgical history and problem list.  Review of Systems Pertinent items are noted in HPI.    Objective:    General appearance: alert and no distress Abdomen: normal findings: soft, non-tender Pelvic: cervix normal in appearance, external genitalia normal, no adnexal masses or tenderness, no cervical motion tenderness, uterus normal size, shape, and consistency and vagina with whitish discharge.    Assessment:    Healthy female exam.   Yeast vaginitis.   Plan:    Education reviewed: self breast exams and yeast vaginitis.Krystal Clark 1 Rx.    Annual exam next visit.

## 2012-08-29 LAB — WET PREP BY MOLECULAR PROBE
Gardnerella vaginalis: NEGATIVE
Trichomonas vaginosis: NEGATIVE

## 2012-10-10 ENCOUNTER — Other Ambulatory Visit: Payer: Self-pay | Admitting: Obstetrics

## 2012-10-14 ENCOUNTER — Other Ambulatory Visit: Payer: Self-pay | Admitting: Obstetrics

## 2012-10-14 DIAGNOSIS — N6320 Unspecified lump in the left breast, unspecified quadrant: Secondary | ICD-10-CM

## 2012-10-17 ENCOUNTER — Other Ambulatory Visit: Payer: Self-pay | Admitting: Obstetrics

## 2012-10-23 ENCOUNTER — Ambulatory Visit
Admission: RE | Admit: 2012-10-23 | Discharge: 2012-10-23 | Disposition: A | Discharge status: Home / Self Care | Payer: Medicaid Other | Source: Ambulatory Visit | Attending: Obstetrics | Admitting: Obstetrics

## 2012-10-23 ENCOUNTER — Other Ambulatory Visit: Payer: Self-pay | Admitting: Obstetrics

## 2012-10-23 DIAGNOSIS — N6320 Unspecified lump in the left breast, unspecified quadrant: Secondary | ICD-10-CM

## 2012-11-04 ENCOUNTER — Other Ambulatory Visit (HOSPITAL_COMMUNITY): Payer: Self-pay | Admitting: Obstetrics

## 2013-02-24 ENCOUNTER — Other Ambulatory Visit: Payer: Self-pay | Admitting: Obstetrics

## 2013-06-22 ENCOUNTER — Other Ambulatory Visit: Payer: Self-pay | Admitting: Obstetrics

## 2013-06-22 DIAGNOSIS — N6310 Unspecified lump in the right breast, unspecified quadrant: Secondary | ICD-10-CM

## 2013-06-26 ENCOUNTER — Ambulatory Visit
Admission: RE | Admit: 2013-06-26 | Discharge: 2013-06-26 | Disposition: A | Discharge status: Home / Self Care | Payer: Medicaid Other | Source: Ambulatory Visit | Attending: Obstetrics | Admitting: Obstetrics

## 2013-06-26 ENCOUNTER — Other Ambulatory Visit: Payer: Self-pay | Admitting: Obstetrics

## 2013-06-26 DIAGNOSIS — N6310 Unspecified lump in the right breast, unspecified quadrant: Secondary | ICD-10-CM

## 2013-06-26 DIAGNOSIS — N6320 Unspecified lump in the left breast, unspecified quadrant: Secondary | ICD-10-CM

## 2013-07-02 ENCOUNTER — Other Ambulatory Visit: Payer: Self-pay

## 2013-07-02 ENCOUNTER — Other Ambulatory Visit: Payer: Self-pay | Admitting: Obstetrics

## 2013-07-02 DIAGNOSIS — N6320 Unspecified lump in the left breast, unspecified quadrant: Secondary | ICD-10-CM

## 2013-07-14 ENCOUNTER — Ambulatory Visit: Payer: Medicaid Other | Admitting: Obstetrics

## 2013-07-20 ENCOUNTER — Other Ambulatory Visit: Payer: Medicaid Other

## 2014-07-02 DIAGNOSIS — L03115 Cellulitis of right lower limb: Secondary | ICD-10-CM

## 2014-07-02 HISTORY — DX: Cellulitis of right lower limb: L03.115

## 2014-07-20 ENCOUNTER — Emergency Department (HOSPITAL_COMMUNITY)
Admission: EM | Admit: 2014-07-20 | Discharge: 2014-07-21 | Disposition: A | Discharge status: Home / Self Care | Payer: Medicaid Other | Attending: Emergency Medicine | Admitting: Emergency Medicine

## 2014-07-20 ENCOUNTER — Encounter (HOSPITAL_COMMUNITY): Payer: Self-pay

## 2014-07-20 DIAGNOSIS — Z79899 Other long term (current) drug therapy: Secondary | ICD-10-CM | POA: Insufficient documentation

## 2014-07-20 DIAGNOSIS — E669 Obesity, unspecified: Secondary | ICD-10-CM | POA: Insufficient documentation

## 2014-07-20 DIAGNOSIS — B349 Viral infection, unspecified: Secondary | ICD-10-CM

## 2014-07-20 DIAGNOSIS — Z880 Allergy status to penicillin: Secondary | ICD-10-CM | POA: Diagnosis not present

## 2014-07-20 DIAGNOSIS — R112 Nausea with vomiting, unspecified: Secondary | ICD-10-CM | POA: Insufficient documentation

## 2014-07-20 DIAGNOSIS — Z7952 Long term (current) use of systemic steroids: Secondary | ICD-10-CM | POA: Insufficient documentation

## 2014-07-20 LAB — COMPREHENSIVE METABOLIC PANEL
ALK PHOS: 43 U/L (ref 39–117)
ALT: 15 U/L (ref 0–35)
ANION GAP: 13 (ref 5–15)
AST: 17 U/L (ref 0–37)
Albumin: 3.8 g/dL (ref 3.5–5.2)
BILIRUBIN TOTAL: 0.9 mg/dL (ref 0.3–1.2)
BUN: 9 mg/dL (ref 6–23)
CO2: 19 mmol/L (ref 19–32)
CREATININE: 0.94 mg/dL (ref 0.50–1.10)
Calcium: 8.8 mg/dL (ref 8.4–10.5)
Chloride: 104 mmol/L (ref 96–112)
GFR calc non Af Amer: 78 mL/min — ABNORMAL LOW (ref >90)
GLUCOSE: 119 mg/dL — AB (ref 70–99)
Potassium: 3.9 mmol/L (ref 3.5–5.1)
Sodium: 136 mmol/L (ref 135–145)
Total Protein: 7.4 g/dL (ref 6.0–8.3)

## 2014-07-20 LAB — CBC WITH DIFFERENTIAL/PLATELET
Basophils Absolute: 0 10*3/uL (ref 0.0–0.1)
Basophils Relative: 0 % (ref 0–1)
EOS ABS: 0 10*3/uL (ref 0.0–0.7)
EOS PCT: 0 % (ref 0–5)
HEMATOCRIT: 43.5 % (ref 36.0–46.0)
HEMOGLOBIN: 14.1 g/dL (ref 12.0–15.0)
LYMPHS ABS: 0.8 10*3/uL (ref 0.7–4.0)
Lymphocytes Relative: 5 % — ABNORMAL LOW (ref 12–46)
MCH: 27 pg (ref 26.0–34.0)
MCHC: 32.4 g/dL (ref 30.0–36.0)
MCV: 83.2 fL (ref 78.0–100.0)
MONOS PCT: 3 % (ref 3–12)
Monocytes Absolute: 0.5 10*3/uL (ref 0.1–1.0)
NEUTROS PCT: 92 % — AB (ref 43–77)
Neutro Abs: 13.6 10*3/uL — ABNORMAL HIGH (ref 1.7–7.7)
Platelets: 173 10*3/uL (ref 150–400)
RBC: 5.23 MIL/uL — ABNORMAL HIGH (ref 3.87–5.11)
RDW: 13.5 % (ref 11.5–15.5)
WBC: 14.9 10*3/uL — ABNORMAL HIGH (ref 4.0–10.5)

## 2014-07-20 LAB — URINALYSIS, ROUTINE W REFLEX MICROSCOPIC
Bilirubin Urine: NEGATIVE
GLUCOSE, UA: NEGATIVE mg/dL
Hgb urine dipstick: NEGATIVE
Ketones, ur: NEGATIVE mg/dL
LEUKOCYTES UA: NEGATIVE
NITRITE: NEGATIVE
PH: 7 (ref 5.0–8.0)
PROTEIN: NEGATIVE mg/dL
Specific Gravity, Urine: 1.02 (ref 1.005–1.030)
Urobilinogen, UA: 2 mg/dL — ABNORMAL HIGH (ref 0.0–1.0)

## 2014-07-20 LAB — I-STAT CG4 LACTIC ACID, ED: LACTIC ACID, VENOUS: 1.12 mmol/L (ref 0.5–2.0)

## 2014-07-20 MED ORDER — ACETAMINOPHEN 325 MG PO TABS
650.0000 mg | ORAL_TABLET | Freq: Once | ORAL | Status: AC
  Administered 2014-07-20: 650 mg via ORAL

## 2014-07-20 MED ORDER — IBUPROFEN 400 MG PO TABS
400.0000 mg | ORAL_TABLET | Freq: Once | ORAL | Status: AC
  Administered 2014-07-20: 400 mg via ORAL
  Filled 2014-07-20: qty 1

## 2014-07-20 MED ORDER — ONDANSETRON HCL 4 MG PO TABS: 4.0000 mg | ORAL_TABLET | Freq: Three times a day (TID) | ORAL | Status: DC | PRN

## 2014-07-20 MED ORDER — ACETAMINOPHEN 325 MG PO TABS
ORAL_TABLET | ORAL | Status: AC
  Filled 2014-07-20: qty 1

## 2014-07-20 MED ORDER — HYDROCODONE-ACETAMINOPHEN 5-325 MG PO TABS
1.0000 | ORAL_TABLET | Freq: Once | ORAL | Status: AC
Start: 2014-07-20 — End: 2014-07-20
  Administered 2014-07-20: 1 via ORAL
  Filled 2014-07-20: qty 1

## 2014-07-20 MED ORDER — ONDANSETRON HCL 4 MG/2ML IJ SOLN
4.0000 mg | Freq: Once | INTRAMUSCULAR | Status: AC
Start: 2014-07-20 — End: 2014-07-20
  Administered 2014-07-20: 4 mg via INTRAVENOUS
  Filled 2014-07-20: qty 2

## 2014-07-20 NOTE — ED Provider Notes (Signed)
CSN: [id number]     Arrival date & time 07/20/14  2022 History   First MD Initiated Contact with Patient 07/20/14 2208     Chief Complaint  Patient presents with  . Emesis  . Generalized Body Aches  . Fatigue   (Consider location/radiation/quality/duration/timing/severity/associated sxs/prior Treatment) Patient is a 34 y.o. female presenting with vomiting. The history is provided by the patient. No language interpreter was used.  Emesis Severity:  Mild Timing:  Constant Number of daily episodes:  5 Quality:  Stomach contents Able to tolerate:  Liquids Progression:  Unchanged Chronicity:  New Recent urination:  Normal Relieved by:  Nothing Worsened by:  Nothing tried Ineffective treatments:  None tried Associated symptoms: abdominal pain, arthralgias, chills, diarrhea, fever, headaches and myalgias   Associated symptoms: no cough   Risk factors: no diabetes, no sick contacts, no suspect food intake and no travel to endemic areas     History reviewed. No pertinent past medical history. History reviewed. No pertinent past surgical history. History reviewed. No pertinent family history. History  Substance Use Topics  . Smoking status: Never Smoker   . Smokeless tobacco: Not on file  . Alcohol Use: No   OB History    No data available     Review of Systems  Constitutional: Positive for fever, chills and fatigue. Negative for diaphoresis.  Respiratory: Negative for cough, chest tightness, shortness of breath and wheezing.   Cardiovascular: Negative for chest pain.  Gastrointestinal: Positive for vomiting, abdominal pain and diarrhea. Negative for nausea and constipation.  Musculoskeletal: Positive for myalgias and arthralgias.  Neurological: Positive for headaches. Negative for syncope, weakness and light-headedness.  Psychiatric/Behavioral: Negative for confusion. The patient is not nervous/anxious.   All other systems reviewed and are negative.     Allergies   Penicillins  Home Medications   Prior to Admission medications   Medication Sig Start Date End Date Taking? Authorizing Provider  ibuprofen (ADVIL,MOTRIN) 200 MG tablet Take 200 mg by mouth every 6 (six) hours as needed.     Yes Historical Provider, MD  Butoconazole Nitrate, 1 Dose, 2 % CREA Place 1 applicator vaginally Nightly. Patient not taking: Reported on 07/20/2014 08/28/12   Brock Bad, MD  cimetidine (TAGAMET) 400 MG tablet Take 1 tablet (400 mg total) by mouth 2 (two) times daily. Patient not taking: Reported on 07/20/2014 08/18/12   Reuben Likes, MD  esomeprazole (NEXIUM) 10 MG packet Take 40 mg by mouth daily before breakfast. Patient not taking: Reported on 07/20/2014 08/28/12   Brock Bad, MD  fexofenadine (ALLEGRA) 180 MG tablet Take 1 tablet (180 mg total) by mouth daily. Patient not taking: Reported on 07/20/2014 08/18/12   Reuben Likes, MD  ibuprofen (ADVIL,MOTRIN) 800 MG tablet TAKE 1 TABLET EVERY 8 HOURS AS NEEDED. Patient not taking: Reported on 07/20/2014 02/24/13   Brock Bad, MD  ondansetron (ZOFRAN) 4 MG tablet Take 1 tablet (4 mg total) by mouth every 8 (eight) hours as needed for nausea or vomiting. 07/20/14   Lenell Antu, MD  predniSONE (DELTASONE) 10 MG tablet Take 4 tabs daily for 4 days, 3 tabs daily for 4 days, 2 tabs daily for 4 days, then 1 tab daily for 4 days. Patient not taking: Reported on 07/20/2014 08/18/12   Reuben Likes, MD   BP 106/51 mmHg  Pulse 96  Temp(Src) 99.8 F (37.7 C) (Oral)  Resp 20  Ht  (1.6 m)  Wt 320 lb (145.151 kg)  BMI  56.70 kg/m2  SpO2 98%   Physical Exam  Constitutional: She is oriented to person, place, and time. She appears well-developed and well-nourished. She is active. She does not appear ill. No distress.  HENT:  Head: Normocephalic and atraumatic.  Nose: Nose normal.  Mouth/Throat: Oropharynx is clear and moist. No oropharyngeal exudate.  Eyes: EOM are normal. Pupils are equal, round, and  reactive to light.  Neck: Normal range of motion. Neck supple.  Cardiovascular: Normal rate, regular rhythm, normal heart sounds and intact distal pulses.   No murmur heard. Pulmonary/Chest: Effort normal and breath sounds normal. No respiratory distress. She has no wheezes. She exhibits no tenderness.  Abdominal: Soft. There is no tenderness. There is no rebound and no guarding.  Obese. Soft.  Mildly diffuse tenderness  Musculoskeletal: Normal range of motion. She exhibits tenderness.  Mildly diffuse tenderness on lateral back palpation of muscles.  No pinpoint tenderness  Lymphadenopathy:    She has no cervical adenopathy.  Neurological: She is alert and oriented to person, place, and time. No cranial nerve deficit. Coordination normal.  Skin: Skin is warm and dry. She is not diaphoretic.  Psychiatric: She has a normal mood and affect. Her behavior is normal. Judgment and thought content normal.  Nursing note and vitals reviewed.   ED Course  Procedures (including critical care time) Labs Review Labs Reviewed  CBC WITH DIFFERENTIAL/PLATELET - Abnormal; Notable for the following:    WBC 14.9 (*)    RBC 5.23 (*)    Neutrophils Relative % 92 (*)    Neutro Abs 13.6 (*)    Lymphocytes Relative 5 (*)    All other components within normal limits  COMPREHENSIVE METABOLIC PANEL - Abnormal; Notable for the following:    Glucose, Bld 119 (*)    GFR calc non Af Amer 78 (*)    All other components within normal limits  URINALYSIS, ROUTINE W REFLEX MICROSCOPIC - Abnormal; Notable for the following:    Color, Urine AMBER (*)    APPearance CLOUDY (*)    Urobilinogen, UA 2.0 (*)    All other components within normal limits  CULTURE, BLOOD (SINGLE)  I-STAT CG4 LACTIC ACID, ED    Imaging Review No results found.   EKG Interpretation None      MDM   Final diagnoses:  Non-intractable vomiting with nausea, vomiting of unspecified type  Viral illness   Pt is a 34 yo F with hx of  obesity who presents with viral illness x 1 day.  Complains of fever, chills, N/V, abd pain, diffuse back pain, headache, and leg cramping today.  Able to tolerate PO intake this AM but nothing since.  She took some motrin this AM but since has not tried anything.  She denies known sick contacts.  No recent medication changes or travel.   Patient was found to be febrile to 101 on arrival so was given tylenol and later motrin for continued myalgias.  She had soft BP initially, but improved with BP cuff manipulation and BP readings were 105-120 systolic after.   She was given a NS bolus, zofran, and norco for back pain/fever.   Patients labs returned with a leukocytosis (14.9) but otherwise benign.  Normal lactate.  Good kidney function.  No urine infection.   She improved after the above symptomatic treatment.  Likely viral illness.  She was given instructions on treatment for viruses including tylenol, motrin, and good fluid intake.  Will be discharged home with Rx for zofran.  Advised follow up with PCP as needed.    Patient was seen with ED Attending, Dr. Sharl MaGlick  Nehemie Casserly, MD    Lenell AntuJamie Clarisse Rodriges, MD 07/21/14 [id number]  Dione Boozeavid Glick, MD 07/25/14 [phone]

## 2014-07-20 NOTE — ED Notes (Signed)
MD at bedside. 

## 2014-07-20 NOTE — ED Notes (Signed)
Pt here for generalized weakness, onset this am after waking at approx 11 am, pt sts was ok initially when waking up, pt ate toast and at 11 am onset of chills, pain, weak, vomiting, back and head pain progressing through the day.

## 2014-07-22 ENCOUNTER — Inpatient Hospital Stay (HOSPITAL_COMMUNITY): Payer: Medicaid Other

## 2014-07-22 ENCOUNTER — Inpatient Hospital Stay (HOSPITAL_COMMUNITY)
Admission: EM | Admit: 2014-07-22 | Discharge: 2014-07-25 | DRG: 603 | Disposition: A | Discharge status: Home / Self Care | Payer: Medicaid Other | Attending: Oncology | Admitting: Oncology

## 2014-07-22 ENCOUNTER — Encounter (HOSPITAL_COMMUNITY): Payer: Self-pay | Admitting: Cardiology

## 2014-07-22 ENCOUNTER — Telehealth (HOSPITAL_BASED_OUTPATIENT_CLINIC_OR_DEPARTMENT_OTHER): Payer: Self-pay | Admitting: Emergency Medicine

## 2014-07-22 ENCOUNTER — Telehealth: Payer: Self-pay | Admitting: *Deleted

## 2014-07-22 ENCOUNTER — Other Ambulatory Visit (HOSPITAL_COMMUNITY): Payer: Self-pay

## 2014-07-22 DIAGNOSIS — M79604 Pain in right leg: Secondary | ICD-10-CM | POA: Diagnosis not present

## 2014-07-22 DIAGNOSIS — N809 Endometriosis, unspecified: Secondary | ICD-10-CM | POA: Diagnosis present

## 2014-07-22 DIAGNOSIS — K5900 Constipation, unspecified: Secondary | ICD-10-CM | POA: Diagnosis present

## 2014-07-22 DIAGNOSIS — M7989 Other specified soft tissue disorders: Secondary | ICD-10-CM

## 2014-07-22 DIAGNOSIS — G8929 Other chronic pain: Secondary | ICD-10-CM | POA: Diagnosis present

## 2014-07-22 DIAGNOSIS — R7309 Other abnormal glucose: Secondary | ICD-10-CM | POA: Diagnosis present

## 2014-07-22 DIAGNOSIS — Z6841 Body Mass Index (BMI) 40.0 and over, adult: Secondary | ICD-10-CM

## 2014-07-22 DIAGNOSIS — D649 Anemia, unspecified: Secondary | ICD-10-CM | POA: Diagnosis present

## 2014-07-22 DIAGNOSIS — R7881 Bacteremia: Secondary | ICD-10-CM | POA: Diagnosis present

## 2014-07-22 DIAGNOSIS — R509 Fever, unspecified: Secondary | ICD-10-CM

## 2014-07-22 DIAGNOSIS — D696 Thrombocytopenia, unspecified: Secondary | ICD-10-CM | POA: Diagnosis present

## 2014-07-22 DIAGNOSIS — D6959 Other secondary thrombocytopenia: Secondary | ICD-10-CM | POA: Diagnosis present

## 2014-07-22 DIAGNOSIS — R7989 Other specified abnormal findings of blood chemistry: Secondary | ICD-10-CM

## 2014-07-22 DIAGNOSIS — M79609 Pain in unspecified limb: Secondary | ICD-10-CM | POA: Diagnosis not present

## 2014-07-22 DIAGNOSIS — Z880 Allergy status to penicillin: Secondary | ICD-10-CM

## 2014-07-22 DIAGNOSIS — R234 Changes in skin texture: Secondary | ICD-10-CM

## 2014-07-22 DIAGNOSIS — R7303 Prediabetes: Secondary | ICD-10-CM | POA: Diagnosis present

## 2014-07-22 DIAGNOSIS — L03115 Cellulitis of right lower limb: Secondary | ICD-10-CM | POA: Diagnosis present

## 2014-07-22 DIAGNOSIS — B9689 Other specified bacterial agents as the cause of diseases classified elsewhere: Secondary | ICD-10-CM

## 2014-07-22 DIAGNOSIS — B349 Viral infection, unspecified: Secondary | ICD-10-CM

## 2014-07-22 HISTORY — DX: Low back pain, unspecified: M54.50

## 2014-07-22 HISTORY — DX: Unspecified osteoarthritis, unspecified site: M19.90

## 2014-07-22 HISTORY — DX: Low back pain: M54.5

## 2014-07-22 HISTORY — DX: Other chronic pain: G89.29

## 2014-07-22 LAB — CBC WITH DIFFERENTIAL/PLATELET
Basophils Absolute: 0 10*3/uL (ref 0.0–0.1)
Basophils Relative: 0 % (ref 0–1)
EOS PCT: 0 % (ref 0–5)
Eosinophils Absolute: 0 10*3/uL (ref 0.0–0.7)
HCT: 37.5 % (ref 36.0–46.0)
Hemoglobin: 11.9 g/dL — ABNORMAL LOW (ref 12.0–15.0)
LYMPHS ABS: 1.2 10*3/uL (ref 0.7–4.0)
LYMPHS PCT: 24 % (ref 12–46)
MCH: 26.7 pg (ref 26.0–34.0)
MCHC: 31.7 g/dL (ref 30.0–36.0)
MCV: 84.3 fL (ref 78.0–100.0)
MONO ABS: 0.3 10*3/uL (ref 0.1–1.0)
Monocytes Relative: 5 % (ref 3–12)
Neutro Abs: 3.6 10*3/uL (ref 1.7–7.7)
Neutrophils Relative %: 71 % (ref 43–77)
Platelets: 128 10*3/uL — ABNORMAL LOW (ref 150–400)
RBC: 4.45 MIL/uL (ref 3.87–5.11)
RDW: 13.8 % (ref 11.5–15.5)
WBC: 5.1 10*3/uL (ref 4.0–10.5)

## 2014-07-22 LAB — COMPREHENSIVE METABOLIC PANEL
ALT: 17 U/L (ref 0–35)
AST: 22 U/L (ref 0–37)
Albumin: 3.1 g/dL — ABNORMAL LOW (ref 3.5–5.2)
Alkaline Phosphatase: 40 U/L (ref 39–117)
Anion gap: 7 (ref 5–15)
BILIRUBIN TOTAL: 0.5 mg/dL (ref 0.3–1.2)
BUN: 8 mg/dL (ref 6–23)
CALCIUM: 8.2 mg/dL — AB (ref 8.4–10.5)
CO2: 23 mmol/L (ref 19–32)
CREATININE: 0.82 mg/dL (ref 0.50–1.10)
Chloride: 107 mmol/L (ref 96–112)
GFR calc Af Amer: >90 mL/min (ref >90)
GFR calc non Af Amer: >90 mL/min (ref >90)
Glucose, Bld: 109 mg/dL — ABNORMAL HIGH (ref 70–99)
Potassium: 3.7 mmol/L (ref 3.5–5.1)
SODIUM: 137 mmol/L (ref 135–145)
Total Protein: 6.3 g/dL (ref 6.0–8.3)

## 2014-07-22 LAB — MAGNESIUM: MAGNESIUM: 1.9 mg/dL (ref 1.5–2.5)

## 2014-07-22 LAB — LIPID PANEL
Cholesterol: 132 mg/dL (ref 0–200)
HDL: 33 mg/dL — AB (ref >39)
LDL Cholesterol: 82 mg/dL (ref 0–99)
TRIGLYCERIDES: 84 mg/dL (ref <150)
Total CHOL/HDL Ratio: 4 RATIO
VLDL: 17 mg/dL (ref 0–40)

## 2014-07-22 LAB — PHOSPHORUS: Phosphorus: 2.9 mg/dL (ref 2.3–4.6)

## 2014-07-22 LAB — I-STAT CG4 LACTIC ACID, ED: LACTIC ACID, VENOUS: 0.59 mmol/L (ref 0.5–2.0)

## 2014-07-22 LAB — MRSA PCR SCREENING: MRSA BY PCR: NEGATIVE

## 2014-07-22 LAB — TECHNOLOGIST SMEAR REVIEW

## 2014-07-22 MED ORDER — IBUPROFEN 200 MG PO TABS
600.0000 mg | ORAL_TABLET | Freq: Four times a day (QID) | ORAL | Status: DC | PRN
  Administered 2014-07-24: 600 mg via ORAL
  Filled 2014-07-22: qty 3

## 2014-07-22 MED ORDER — ENOXAPARIN SODIUM 40 MG/0.4ML ~~LOC~~ SOLN
40.0000 mg | SUBCUTANEOUS | Status: DC
  Administered 2014-07-22 – 2014-07-24 (×3): 40 mg via SUBCUTANEOUS
  Filled 2014-07-22 (×4): qty 0.4

## 2014-07-22 MED ORDER — SODIUM CHLORIDE 0.9 % IV BOLUS (SEPSIS)
30.0000 mL/kg | Freq: Once | INTRAVENOUS | Status: AC
  Administered 2014-07-22: 1000 mL via INTRAVENOUS

## 2014-07-22 MED ORDER — HYDROMORPHONE HCL 1 MG/ML IJ SOLN: 1.0000 mg | INTRAMUSCULAR | Status: DC | PRN

## 2014-07-22 MED ORDER — NIVEA EX CREA
TOPICAL_CREAM | CUTANEOUS | Status: DC | PRN
  Filled 2014-07-22: qty 120

## 2014-07-22 MED ORDER — ACETAMINOPHEN 325 MG PO TABS: 650.0000 mg | ORAL_TABLET | ORAL | Status: DC | PRN

## 2014-07-22 MED ORDER — ONDANSETRON HCL 4 MG/2ML IJ SOLN
4.0000 mg | Freq: Once | INTRAMUSCULAR | Status: AC
  Administered 2014-07-22: 4 mg via INTRAVENOUS
  Filled 2014-07-22: qty 2

## 2014-07-22 MED ORDER — HYDROCODONE-ACETAMINOPHEN 5-325 MG PO TABS
1.0000 | ORAL_TABLET | Freq: Four times a day (QID) | ORAL | Status: DC | PRN
  Administered 2014-07-22: 1 via ORAL
  Administered 2014-07-23 (×3): 2 via ORAL
  Filled 2014-07-22 (×4): qty 2

## 2014-07-22 MED ORDER — VANCOMYCIN HCL 10 G IV SOLR
1500.0000 mg | Freq: Three times a day (TID) | INTRAVENOUS | Status: DC
  Administered 2014-07-23: 1500 mg via INTRAVENOUS
  Filled 2014-07-22 (×2): qty 1500

## 2014-07-22 MED ORDER — SODIUM CHLORIDE 0.9 % IV SOLN
INTRAVENOUS | Status: AC
  Administered 2014-07-22: 21:00:00 via INTRAVENOUS

## 2014-07-22 MED ORDER — VANCOMYCIN HCL IN DEXTROSE 1-5 GM/200ML-% IV SOLN
1000.0000 mg | Freq: Once | INTRAVENOUS | Status: AC
  Administered 2014-07-22: 1000 mg via INTRAVENOUS
  Filled 2014-07-22: qty 200

## 2014-07-22 MED ORDER — HYDROCODONE-ACETAMINOPHEN 5-325 MG PO TABS: 1.0000 | ORAL_TABLET | ORAL | Status: DC | PRN

## 2014-07-22 MED ORDER — HYDROMORPHONE HCL 1 MG/ML IJ SOLN
1.0000 mg | Freq: Once | INTRAMUSCULAR | Status: AC
  Administered 2014-07-22: 1 mg via INTRAVENOUS
  Filled 2014-07-22: qty 1

## 2014-07-22 MED ORDER — DEXTROSE 5 % IV SOLN
1.0000 g | INTRAVENOUS | Status: DC
  Administered 2014-07-22 – 2014-07-23 (×2): 1 g via INTRAVENOUS
  Filled 2014-07-22 (×3): qty 10

## 2014-07-22 MED ORDER — HYDROCERIN EX CREA
TOPICAL_CREAM | Freq: Two times a day (BID) | CUTANEOUS | Status: DC
  Administered 2014-07-22 – 2014-07-24 (×5): via TOPICAL
  Filled 2014-07-22: qty 113

## 2014-07-22 MED ORDER — ONDANSETRON HCL 4 MG/2ML IJ SOLN
4.0000 mg | Freq: Four times a day (QID) | INTRAMUSCULAR | Status: DC | PRN
  Administered 2014-07-23 – 2014-07-24 (×3): 4 mg via INTRAVENOUS
  Filled 2014-07-22 (×3): qty 2

## 2014-07-22 MED ORDER — CEFTRIAXONE SODIUM 1 G IJ SOLR: 1.0000 g | INTRAMUSCULAR | Status: DC

## 2014-07-22 NOTE — ED Notes (Signed)
Pt reports that she was seen here a couple of days ago and dx with a viral illness. States that she was called and told to come back because of a positive blood culture. Reports pain all over but also pain to the right groin.

## 2014-07-22 NOTE — Progress Notes (Signed)
Right lower extremity venous duplex completed:  No obvious evidence of DVT, superficial thrombosis, or Baker's cyst.  Technically difficult study due to the patient's body habitus and guarding secondary to the pain.

## 2014-07-22 NOTE — ED Notes (Signed)
(+)   [facility]

## 2014-07-22 NOTE — ED Notes (Signed)
Pt ambulated to bathroom with assistance.  Pt tolerated well with no complaints.

## 2014-07-22 NOTE — H&P (Signed)
Date: 07/22/2014               Patient Name:  [name] MRN: [id number]  DOB: [birth year] Age / Sex: 34 y.o., female   PCP: No Pcp Per Patient         Medical Service: Internal Medicine Teaching Service         Attending Physician: Dr. Levert Feinstein, MD    First Contact: Dr. Tasia Catchings Pager: [phone]  Second Contact: Dr. Johna Roles Pager: [phone]       After Hours (After 5p/  First Contact Pager: [phone]  weekends / holidays): Second Contact Pager: [phone]   Chief Complaint: right leg pain, 1 positive BCX, cellulitis,   History of Present Illness:   34 yo female with no past medical hx. Here with right leg pain and 1 positive BCX GPCocci in clusters.   Recently came in with fever/chills/n/v/ 07/20/2014. Thought to be 2/2 to viral at that time.Had leukocytosis 14.9. Had fever 101.6 and some ?hypotension at 68/28 at that time. It recovered on manual BP check, and also got some IVF in the ED. Was told to take zofran, tylenol, motrin, and PO fluid. Bcx was checked only 1x. That culture is growing . She was told to come back due to the positive blood culture (only one bcx was drawn) growing gram positive cocci in clusters.   Patient continues to have body ache, generalized. Denies any cough, fever, chills, n/v/diarrhea, dysuria. Denies any sick contacts. No flu shot. No travels.  Has chronic lower back pain but now also having pain on right leg, with redness and warmth. Pain in 5/10 after receiving some dilaudid. Negative for DVT. No injuries or bug bites, scratching. Does have cracked feet bilaterally. No hx of diabetes but has not been tested/does not have a PCP. Denies any IV drug use.  Had 2010 1 out of 2 bcx + for staph aureus, thought to be contaminant. Was here at that time with endometriosis.    Meds: Current Facility-Administered Medications  Medication Dose Route Frequency Provider Last Rate Last Dose  . acetaminophen (TYLENOL) tablet 650 mg  650 mg Oral Q4H PRN Marjan  Rabbani, MD      . cefTRIAXone (ROCEPHIN) 1 g in dextrose 5 % 50 mL IVPB  1 g Intravenous Q24H Levert Feinstein, MD 100 mL/hr at 07/22/14 1824 1 g at 07/22/14 1824  . enoxaparin (LOVENOX) injection 40 mg  40 mg Subcutaneous Q24H Marjan Rabbani, MD      . HYDROcodone-acetaminophen (NORCO/VICODIN) 5-325 MG per tablet 1-2 tablet  1-2 tablet Oral Q6H PRN Otis Brace, MD      . ibuprofen (ADVIL,MOTRIN) tablet 600 mg  600 mg Oral Q6H PRN Marjan Rabbani, MD      . ondansetron (ZOFRAN) injection 4 mg  4 mg Intravenous Q6H PRN Otis Brace, MD      . Melene Muller ON 07/23/2014] vancomycin (VANCOCIN) 1,500 mg in sodium chloride 0.9 % 500 mL IVPB  1,500 mg Intravenous Q8H Titus Mould, RPH      . vancomycin (VANCOCIN) IVPB 1000 mg/200 mL premix  1,000 mg Intravenous Once Titus Mould, [facility]       Current Outpatient Prescriptions  Medication Sig Dispense Refill  . acetaminophen (TYLENOL) 500 MG tablet Take 1,000 mg by mouth every 6 (six) hours as needed for mild pain or moderate pain.    Marland Kitchen ibuprofen (ADVIL,MOTRIN) 200 MG tablet Take 200 mg by mouth every 6 (six) hours as needed.      Marland Kitchen  ondansetron (ZOFRAN) 4 MG tablet Take 1 tablet (4 mg total) by mouth every 8 (eight) hours as needed for nausea or vomiting. 12 tablet 0  . Butoconazole Nitrate, 1 Dose, 2 % CREA Place 1 applicator vaginally Nightly. (Patient not taking: Reported on 07/20/2014) 5.8 g 2  . cimetidine (TAGAMET) 400 MG tablet Take 1 tablet (400 mg total) by mouth 2 (two) times daily. (Patient not taking: Reported on 07/20/2014) 30 tablet 0  . esomeprazole (NEXIUM) 10 MG packet Take 40 mg by mouth daily before breakfast. (Patient not taking: Reported on 07/20/2014) 30 each 12  . fexofenadine (ALLEGRA) 180 MG tablet Take 1 tablet (180 mg total) by mouth daily. (Patient not taking: Reported on 07/20/2014) 15 tablet 0  . ibuprofen (ADVIL,MOTRIN) 800 MG tablet TAKE 1 TABLET EVERY 8 HOURS AS NEEDED. (Patient not taking: Reported on 07/20/2014) 30 tablet 7     Allergies: Allergies as of 07/22/2014 - Review Complete 07/22/2014  Allergen Reaction Noted  . Penicillins  03/11/2011   History reviewed. No pertinent past medical history. History reviewed. No pertinent past surgical history. History reviewed. No pertinent family history. History   Social History  . Marital Status: Married    Spouse Name: N/A  . Number of Children: N/A  . Years of Education: N/A   Occupational History  . Not on file.   Social History Main Topics  . Smoking status: Never Smoker   . Smokeless tobacco: Not on file  . Alcohol Use: No  . Drug Use: No  . Sexual Activity: Yes    Birth Control/ Protection: Implant   Other Topics Concern  . Not on file   Social History Narrative    Review of Systems: Review of Systems  Constitutional: Negative for fever, chills and weight loss.  HENT: Negative for congestion, ear discharge and sore throat.   Eyes: Negative for blurred vision and double vision.  Respiratory: Negative for cough, sputum production, shortness of breath and wheezing.   Cardiovascular: Negative for chest pain, palpitations, orthopnea and claudication.  Gastrointestinal: Positive for nausea. Negative for heartburn, vomiting, abdominal pain, diarrhea and blood in stool.  Genitourinary: Negative for dysuria, urgency, hematuria and flank pain.  Musculoskeletal: Positive for myalgias and back pain. Negative for joint pain and falls.  Skin: Negative for itching and rash.  Neurological: Negative for dizziness, seizures, loss of consciousness, weakness and headaches.  Endo/Heme/Allergies: Negative.   Psychiatric/Behavioral: Negative.      Physical Exam: Blood pressure 131/91, pulse 79, temperature 98.7 F (37.1 C), temperature source Oral, resp. rate 18, weight 320 lb (145.151 kg), last menstrual period 07/08/2014, SpO2 100 %. Physical Exam  Constitutional: She is oriented to person, place, and time. She appears well-developed and  well-nourished. No distress.  Obese female. NAD. Lying in bed. Cooperative, pleasant.  HENT:  Head: Normocephalic and atraumatic.  Right Ear: External ear normal.  Left Ear: External ear normal.  Mouth/Throat: Oropharynx is clear and moist. No oropharyngeal exudate.  Eyes: Conjunctivae are normal. Pupils are equal, round, and reactive to light. Right eye exhibits no discharge. Left eye exhibits no discharge.  Neck: Normal range of motion. No JVD present.  Cardiovascular: Normal rate and regular rhythm.  Exam reveals no gallop and no friction rub.   No murmur heard. Respiratory: Effort normal and breath sounds normal. No respiratory distress. She has no wheezes. She has no rales. She exhibits no tenderness.  GI: Soft. Bowel sounds are normal. She exhibits no distension and no mass. There is no  tenderness. There is no rebound and no guarding.  Musculoskeletal: Normal range of motion.  Right shin is erythematous, warmth to touch, tender to palpation. No purulence. No open lesion in this area.  Does have fissure of both feet.  No pitting edema.  Lymphadenopathy:    She has no cervical adenopathy.  Neurological: She is alert and oriented to person, place, and time.  Skin: She is not diaphoretic.     Lab results: Basic Metabolic Panel:  Recent Labs  62/13/08 2045 07/22/14 1303  NA 136 137  K 3.9 3.7  CL 104 107  CO2 19 23  GLUCOSE 119* 109*  BUN 9 8  CREATININE 0.94 0.82  CALCIUM 8.8 8.2*   Liver Function Tests:  Recent Labs  07/20/14 2045 07/22/14 1303  AST 17 22  ALT 15 17  ALKPHOS 43 40  BILITOT 0.9 0.5  PROT 7.4 6.3  ALBUMIN 3.8 3.1*     Recent Labs  07/20/14 2045 07/22/14 1303  WBC 14.9* 5.1  NEUTROABS 13.6* 3.6  HGB 14.1 11.9*  HCT 43.5 37.5  MCV 83.2 84.3  PLT 173 128*  Urinalysis:  Recent Labs  07/20/14 2300  COLORURINE AMBER*  LABSPEC 1.020  PHURINE 7.0  GLUCOSEU NEGATIVE  HGBUR NEGATIVE  BILIRUBINUR NEGATIVE  KETONESUR NEGATIVE   PROTEINUR NEGATIVE  UROBILINOGEN 2.0*  NITRITE NEGATIVE  LEUKOCYTESUR NEGATIVE   Misc. Labs:  Imaging results:  No results found.  Other results:  Assessment & Plan by Problem: Principal Problem:   Bacteremia Active Problems:   Morbid obesity   Cellulitis of right lower extremity   Elevated glucose   Fissure in skin of foot   Thrombocytopenia  34 yo female with no pmh here with right leg cellulitis and possible bacteremia.   Right leg cellulitis - mild. Unclear source but has cracked/fissure of both feet.  - afebrile currently, no white count, right leg is erythematous, warm to touch, no purulence, no abscess that can be drained. Negative for DVT. - appears euvolemic.  ED gave fluid, will stop. Will check orthostats.  - Will treat with vanc since bcx from 07/20/14 growing GPC in clusters.  - also will do ceftriaxone for strep coverage for cellulitis.  - ibuprofen + tylenol +norco for pain and fever.  - will check for diabetes given her obesity. Check hgba1c.  - check HIV.  - treat dry fissure of feet with eucerin.   Bacteremia? - bcx only 1 was drawn by ED provider on 07/20/14. Growing GPC in clusters - this could be contaminant but since only one cx was drawn, it's hard to tell. But since patient continues to have subjective fever, and also having R leg cellulitis, will treat with vanc.  - await speciation from previous culture. F/up repeat BCx. - may have to treat for 2 weeks with vanc.  - obtain CXR for baseline and to rule out pneumonia as the source. No sings of resp problems on exam or history.   Viral infection - with body ache.  - will check for influenza. If +, will treat with tamiflu. Does not appear to be sick enough to treat preemptively.  - supportive care.   Mild hemoglobin and platelet drop - likely 2/2 to hemo dilution. Normal MCV.  - repeat CBC.  May workup anemia and thrombocytopenia if persistent.   Dispo: Disposition is deferred at this time,  awaiting improvement of current medical problems. Anticipated discharge in approximately 1-2 day(s).   The patient does have a current PCP (  No Pcp Per Patient) and does need an [facility] hospital follow-up appointment after discharge.  The patient does have transportation limitations that hinder transportation to clinic appointments.  Signed: Hyacinth Meeker, MD 07/22/2014, 6:44 PM

## 2014-07-22 NOTE — ED Provider Notes (Signed)
CSN: [id number]     Arrival date & time 07/22/14  1142 History   First MD Initiated Contact with Patient 07/22/14 1209     Chief Complaint  Patient presents with  . Fatigue  . Leg Pain     (Consider location/radiation/quality/duration/timing/severity/associated sxs/prior Treatment) HPI   [name] is a 34 y.o. female who is here for evaluation of leg pain with redness, and a positive blood culture. She was evaluated in the ED 2 days ago for myalgia, chills, fever, and vomiting. A single blood culture was sent, which returned showing organism consistent with Staphylococcus. Patient continues to have fever and chills at home. She has not taken her temperature but is consistently taking Tylenol and Motrin for fever. She has nausea without vomiting. She denies injury to her right leg. There are no other known modifying factors.   History reviewed. No pertinent past medical history. History reviewed. No pertinent past surgical history. History reviewed. No pertinent family history. History  Substance Use Topics  . Smoking status: Never Smoker   . Smokeless tobacco: Not on file  . Alcohol Use: No   OB History    No data available     Review of Systems  All other systems reviewed and are negative.     Allergies  Penicillins  Home Medications   Prior to Admission medications   Medication Sig Start Date End Date Taking? Authorizing Provider  acetaminophen (TYLENOL) 500 MG tablet Take 1,000 mg by mouth every 6 (six) hours as needed for mild pain or moderate pain.   Yes Historical Provider, MD  ibuprofen (ADVIL,MOTRIN) 200 MG tablet Take 200 mg by mouth every 6 (six) hours as needed.     Yes Historical Provider, MD  ondansetron (ZOFRAN) 4 MG tablet Take 1 tablet (4 mg total) by mouth every 8 (eight) hours as needed for nausea or vomiting. 07/20/14  Yes Lenell Antu, MD  Butoconazole Nitrate, 1 Dose, 2 % CREA Place 1 applicator vaginally Nightly. Patient not taking: Reported on  07/20/2014 08/28/12   Brock Bad, MD  cimetidine (TAGAMET) 400 MG tablet Take 1 tablet (400 mg total) by mouth 2 (two) times daily. Patient not taking: Reported on 07/20/2014 08/18/12   Reuben Likes, MD  esomeprazole (NEXIUM) 10 MG packet Take 40 mg by mouth daily before breakfast. Patient not taking: Reported on 07/20/2014 08/28/12   Brock Bad, MD  fexofenadine (ALLEGRA) 180 MG tablet Take 1 tablet (180 mg total) by mouth daily. Patient not taking: Reported on 07/20/2014 08/18/12   Reuben Likes, MD  ibuprofen (ADVIL,MOTRIN) 800 MG tablet TAKE 1 TABLET EVERY 8 HOURS AS NEEDED. Patient not taking: Reported on 07/20/2014 02/24/13   Brock Bad, MD  predniSONE (DELTASONE) 10 MG tablet Take 4 tabs daily for 4 days, 3 tabs daily for 4 days, 2 tabs daily for 4 days, then 1 tab daily for 4 days. Patient not taking: Reported on 07/20/2014 08/18/12   Reuben Likes, MD   BP 118/87 mmHg  Pulse 81  Temp(Src) 98.7 F (37.1 C) (Oral)  Resp 18  Wt 320 lb (145.151 kg)  SpO2 99% Physical Exam  Constitutional: She is oriented to person, place, and time. She appears well-developed.  Morbidly obese  HENT:  Head: Normocephalic and atraumatic.  Right Ear: External ear normal.  Left Ear: External ear normal.  Eyes: Conjunctivae and EOM are normal. Pupils are equal, round, and reactive to light.  Neck: Normal range of motion and phonation  normal. Neck supple.  Cardiovascular: Normal rate, regular rhythm and normal heart sounds.   Pulmonary/Chest: Effort normal and breath sounds normal. She exhibits no bony tenderness.  Abdominal: Soft. There is no tenderness.  Musculoskeletal: Normal range of motion.  Mild swelling. Right lower leg with erythema, from the knee to the proximal foot. No associated induration, abscess or skin break to cause soft tissue infection. Toes of the right foot appear normal. Normal peripheral circulation right leg.  Neurological: She is alert and oriented to person, place,  and time. No cranial nerve deficit or sensory deficit. She exhibits normal muscle tone. Coordination normal.  Skin: Skin is warm, dry and intact.  Psychiatric: She has a normal mood and affect. Her behavior is normal. Judgment and thought content normal.  Nursing note and vitals reviewed.   ED Course  Procedures (including critical care time)  Medications  sodium chloride 0.9 % bolus 4,356 mL (1,000 mLs Intravenous New Bag/Given 07/22/14 1313)  vancomycin (VANCOCIN) IVPB 1000 mg/200 mL premix (0 mg Intravenous Stopped 07/22/14 1455)  HYDROmorphone (DILAUDID) injection 1 mg (1 mg Intravenous Given 07/22/14 1612)  ondansetron (ZOFRAN) injection 4 mg (4 mg Intravenous Given 07/22/14 1612)    Patient Vitals for the past 24 hrs:  BP Temp Temp src Pulse Resp SpO2 Weight  07/22/14 1630 118/87 mmHg - - 81 18 99 % -  07/22/14 1600 111/67 mmHg - - 78 18 100 % -  07/22/14 1512 124/71 mmHg 98.7 F (37.1 C) Oral 76 17 100 % -  07/22/14 1430 127/65 mmHg - - 82 - 100 % -  07/22/14 1415 113/70 mmHg - - 80 - 99 % -  07/22/14 1345 (!) 118/54 mmHg - - 78 - 99 % -  07/22/14 1315 120/57 mmHg - - 79 - 98 % -  07/22/14 1230 (!) 120/47 mmHg - - 83 - 99 % -  07/22/14 1215 106/59 mmHg - - 85 - 97 % -  07/22/14 1210 - - - - - 98 % -  07/22/14 1149 128/65 mmHg 98.6 F (37 C) - 98 18 98 % (!) 320 lb (145.151 kg)   Venous Doppler, right leg. Negative for DVT.  5:27 PM Reevaluation with update and discussion. After initial assessment and treatment, an updated evaluation reveals no additional complaints. She continues to have right leg pain which has required narcotic analgesia. Aide Wojnar L    Labs Review Labs Reviewed  COMPREHENSIVE METABOLIC PANEL - Abnormal; Notable for the following:    Glucose, Bld 109 (*)    Calcium 8.2 (*)    Albumin 3.1 (*)    All other components within normal limits  CBC WITH DIFFERENTIAL/PLATELET - Abnormal; Notable for the following:    Hemoglobin 11.9 (*)    Platelets 128  (*)    All other components within normal limits  CULTURE, BLOOD (ROUTINE X 2)  CULTURE, BLOOD (ROUTINE X 2)  I-STAT CG4 LACTIC ACID, ED    Imaging Review No results found.   EKG Interpretation None      MDM   Final diagnoses:  Bacteremia  Cellulitis of right leg    Right leg pain and swelling with evident cellulitis, and recent bacteremia, diagnosed as an outpatient. Lactate today is normal. Blood cultures have been repeated. There is no clear source for cellulitis of the right leg. Patient will require nursing for monitoring and stabilization.   Nursing Notes Reviewed/ Care Coordinated, and agree without changes. Applicable Imaging Reviewed.  Interpretation of Laboratory Data incorporated into  ED treatment  Plan: Admit   Mancel Bale, MD 07/26/14 [phone]

## 2014-07-22 NOTE — Progress Notes (Signed)
ANTIBIOTIC CONSULT NOTE - INITIAL  Pharmacy Consult for vancomycin Indication: bacteremia/cellulitus  Allergies  Allergen Reactions  . Penicillins     hives    Patient Measurements: Weight: (!) 320 lb (145.151 kg)  Vital Signs: Temp: 98.7 F (37.1 C) (04/21 1512) Temp Source: Oral (04/21 1512) BP: 118/87 mmHg (04/21 1630) Pulse Rate: 81 (04/21 1630) Intake/Output from previous day:   Intake/Output from this shift:    Labs:  Recent Labs  07/20/14 2045 07/22/14 1303  WBC 14.9* 5.1  HGB 14.1 11.9*  PLT 173 128*  CREATININE 0.94 0.82   Estimated Creatinine Clearance: 135.3 mL/min (by C-G formula based on Cr of 0.82). No results for input(s): VANCOTROUGH, VANCOPEAK, VANCORANDOM, GENTTROUGH, GENTPEAK, GENTRANDOM, TOBRATROUGH, TOBRAPEAK, TOBRARND, AMIKACINPEAK, AMIKACINTROU, AMIKACIN in the last 72 hours.   Microbiology: Recent Results (from the past 720 hour(s))  Culture, blood (single)     Status: None (Preliminary result)   Collection Time: 07/20/14  8:45 PM  Result Value Ref Range Status   Specimen Description BLOOD LEFT HAND  Final   Special Requests BOTTLES DRAWN AEROBIC ONLY 5CC  Final   Culture   Final    GRAM POSITIVE COCCI IN CLUSTERS Note: Gram Stain Report Called to,Read Back By and Verified With:  TO KIM ROBERTSON @ 1258AM ON 07/22/14 ROHAL Performed at [facility]    Report Status PENDING  Incomplete    Medical History: History reviewed. No pertinent past medical history.  Medications:  Anti-infectives    Start     Dose/Rate Route Frequency Ordered Stop   07/22/14 1830  cefTRIAXone (ROCEPHIN) 1 g in dextrose 5 % 50 mL IVPB     1 g 100 mL/hr over 30 Minutes Intravenous Every 24 hours 07/22/14 1807     07/22/14 1815  vancomycin (VANCOCIN) IVPB 1000 mg/200 mL premix     1,000 mg 200 mL/hr over 60 Minutes Intravenous  Once 07/22/14 1808     07/22/14 1800  cefTRIAXone (ROCEPHIN) injection 1 g  Status:  Discontinued     1 g Intramuscular  Every 24 hours 07/22/14 1755 07/22/14 1807   07/22/14 1300  vancomycin (VANCOCIN) IVPB 1000 mg/200 mL premix     1,000 mg 200 mL/hr over 60 Minutes Intravenous  Once 07/22/14 1254 07/22/14 1455     Assessment: 34 yoF presents to the ED with blood culture positive for GPC in clusters and complaining of leg pain and redness. Patient was evaluated in the ED 2 days prior for presumed viral illness. Pharmacy consulted to dose vancomycin for bacteremia/cellulitus. Afebrile, WBC 5.1, CrCl >120 ml/min.  4/19 BCx1 >> GPC-clusters 4/21 BCx2 >>  CTX 4/21 >> Vanc 4/21 >>  Goal of Therapy:  Vancomycin trough level 15-20 mcg/ml  Plan:  Vancomycin 1 g IV load given in ED Additional vancomycin 1 g IV load, then 1500 mg IV q8h Ceftriaxone 1 g IV q24h VTss if clinically indicated Monitor C&S, clinical progress  Derwood KaplanShah, Ritter Helsley D 07/22/2014,6:07 PM

## 2014-07-22 NOTE — ED Notes (Signed)
Pt c/o right leg pain/swelling/redness. Dr. Effie ShyWentz notified

## 2014-07-23 DIAGNOSIS — R7309 Other abnormal glucose: Secondary | ICD-10-CM

## 2014-07-23 DIAGNOSIS — B958 Unspecified staphylococcus as the cause of diseases classified elsewhere: Secondary | ICD-10-CM

## 2014-07-23 DIAGNOSIS — D696 Thrombocytopenia, unspecified: Secondary | ICD-10-CM

## 2014-07-23 LAB — CBC
HEMATOCRIT: 36.6 % (ref 36.0–46.0)
Hemoglobin: 11.7 g/dL — ABNORMAL LOW (ref 12.0–15.0)
MCH: 26.7 pg (ref 26.0–34.0)
MCHC: 32 g/dL (ref 30.0–36.0)
MCV: 83.4 fL (ref 78.0–100.0)
Platelets: 144 10*3/uL — ABNORMAL LOW (ref 150–400)
RBC: 4.39 MIL/uL (ref 3.87–5.11)
RDW: 13.8 % (ref 11.5–15.5)
WBC: 5.5 10*3/uL (ref 4.0–10.5)

## 2014-07-23 LAB — BASIC METABOLIC PANEL
ANION GAP: 9 (ref 5–15)
BUN: 6 mg/dL (ref 6–23)
CALCIUM: 8.2 mg/dL — AB (ref 8.4–10.5)
CO2: 21 mmol/L (ref 19–32)
CREATININE: 0.7 mg/dL (ref 0.50–1.10)
Chloride: 108 mmol/L (ref 96–112)
Glucose, Bld: 92 mg/dL (ref 70–99)
Potassium: 3.9 mmol/L (ref 3.5–5.1)
Sodium: 138 mmol/L (ref 135–145)

## 2014-07-23 LAB — RAPID URINE DRUG SCREEN, HOSP PERFORMED
AMPHETAMINES: NOT DETECTED
Barbiturates: NOT DETECTED
Benzodiazepines: NOT DETECTED
Cocaine: NOT DETECTED
Opiates: POSITIVE — AB
Tetrahydrocannabinol: NOT DETECTED

## 2014-07-23 LAB — HEMOGLOBIN A1C
Hgb A1c MFr Bld: 5.9 % — ABNORMAL HIGH (ref 4.8–5.6)
Mean Plasma Glucose: 123 mg/dL

## 2014-07-23 LAB — IRON AND TIBC
Iron: 16 ug/dL — ABNORMAL LOW (ref 42–145)
Saturation Ratios: 7 % — ABNORMAL LOW (ref 20–55)
TIBC: 224 ug/dL — ABNORMAL LOW (ref 250–470)
UIBC: 208 ug/dL (ref 125–400)

## 2014-07-23 LAB — HIV ANTIBODY (ROUTINE TESTING W REFLEX): HIV SCREEN 4TH GENERATION: NONREACTIVE

## 2014-07-23 LAB — INFLUENZA PANEL BY PCR (TYPE A & B)
H1N1 flu by pcr: NOT DETECTED
Influenza A By PCR: NEGATIVE
Influenza B By PCR: NEGATIVE

## 2014-07-23 LAB — RETICULOCYTES
RBC.: 4.73 MIL/uL (ref 3.87–5.11)
Retic Count, Absolute: 56.8 10*3/uL (ref 19.0–186.0)
Retic Ct Pct: 1.2 % (ref 0.4–3.1)

## 2014-07-23 LAB — CULTURE, BLOOD (SINGLE)

## 2014-07-23 MED ORDER — HYDROCERIN EX CREA
TOPICAL_CREAM | Freq: Two times a day (BID) | CUTANEOUS | Status: DC
  Filled 2014-07-23: qty 113

## 2014-07-23 NOTE — Care Management Note (Unsigned)
    Page 1 of 1   07/23/2014     4:48:13 PM CARE MANAGEMENT NOTE 07/23/2014  Patient:  [name],[name]   Account Number:  [id number]  Date Initiated:  07/23/2014  Documentation initiated by:  Lawerance SabalSWIST,DEBBIE  Subjective/Objective Assessment:   pt with SOB and breathing complaints     Action/Plan:   will follow for discharge needs   Anticipated DC Date:  07/26/2014   Anticipated DC Plan:  HOME/SELF CARE      DC Planning Services  CM consult  [facility]      Choice offered to / List presented to:             Status of service:  In process, will continue to follow Medicare Important Message given?   (If response is "NO", the following Medicare IM given date fields will be blank) Date Medicare IM given:   Medicare IM given by:   Date Additional Medicare IM given:   Additional Medicare IM given by:    Discharge Disposition:    Per UR Regulation:    If discussed at Long Length of Stay Meetings, dates discussed:    Comments:  07-23-14 Gave pt information on [facility]. Pt to review information and decide if she wants to follow up there. Pt knows if discharged over the weekend how to call and make follow up appointment. Otherwise if pt here Monday, CM can can call to schedule appointment. Lawerance Sabalebbie Swist RN BSN CM

## 2014-07-23 NOTE — Progress Notes (Addendum)
Subjective:  Remains afebrile overnight. Still continues to feel malaise, body ache and leg pain. Pain is on the right leg, with some radiation to her groin. No fever/chills/cough/sob/cp/n/v/diarrhea.  Objective: Vital signs in last 24 hours: Filed Vitals:   07/22/14 2033 07/23/14 0526 07/23/14 0527 07/23/14 0648  BP: 122/50  125/68   Pulse: 88  95   Temp: 98.7 F (37.1 C)  98.9 F (37.2 C)   TempSrc: Oral  Oral   Resp: 19  20   Height:   (1.6 m)    Weight:    325 lb 2.9 oz (147.5 kg)  SpO2: 100%  100%    Weight change:   Intake/Output Summary (Last 24 hours) at 07/23/14 0454 Last data filed at 07/22/14 2043  Gross per 24 hour  Intake    360 ml  Output      0 ml  Net    360 ml   Vitals reviewed. General: resting in bed, NAD, obese female. HEENT: PERRL, EOMI, no scleral icterus Cardiac: RRR, no rubs, murmurs or gallops Pulm: clear to auscultation bilaterally, no wheezes, rales, or rhonchi Abd: soft, nontender, nondistended, BS present Ext: right sheen has large area of erythema, warmth, and tenderness. No purulence. Has skin fissure on both feet on the heel area.  Neuro: alert and oriented X3, cranial nerves II-XII grossly intact, strength and sensation to light touch equal in bilateral upper and lower extremities  Lab Results: Basic Metabolic Panel:  Recent Labs Lab 07/20/14 2045 07/22/14 1303 07/22/14 1806  NA 136 137  --   K 3.9 3.7  --   CL 104 107  --   CO2 19 23  --   GLUCOSE 119* 109*  --   BUN 9 8  --   CREATININE 0.94 0.82  --   CALCIUM 8.8 8.2*  --   MG  --   --  1.9  PHOS  --   --  2.9   Liver Function Tests:  Recent Labs Lab 07/20/14 2045 07/22/14 1303  AST 17 22  ALT 15 17  ALKPHOS 43 40  BILITOT 0.9 0.5  PROT 7.4 6.3  ALBUMIN 3.8 3.1*   No results for input(s): LIPASE, AMYLASE in the last 168 hours. No results for input(s): AMMONIA in the last 168 hours. CBC:  Recent Labs Lab 07/20/14 2045 07/22/14 1303  WBC 14.9* 5.1    NEUTROABS 13.6* 3.6  HGB 14.1 11.9*  HCT 43.5 37.5  MCV 83.2 84.3  PLT 173 128*   Cardiac Enzymes: No results for input(s): CKTOTAL, CKMB, CKMBINDEX, TROPONINI in the last 168 hours. BNP: No results for input(s): PROBNP in the last 168 hours. D-Dimer: No results for input(s): DDIMER in the last 168 hours. CBG: No results for input(s): GLUCAP in the last 168 hours. Hemoglobin A1C:  Recent Labs Lab 07/22/14 1810  HGBA1C 5.9*   Fasting Lipid Panel:  Recent Labs Lab 07/22/14 1827  CHOL 132  HDL 33*  LDLCALC 82  TRIG 84  CHOLHDL 4.0   No results for input(s): ETH in the last 168 hours. Urinalysis:  Recent Labs Lab 07/20/14 2300  COLORURINE AMBER*  LABSPEC 1.020  PHURINE 7.0  GLUCOSEU NEGATIVE  HGBUR NEGATIVE  BILIRUBINUR NEGATIVE  KETONESUR NEGATIVE  PROTEINUR NEGATIVE  UROBILINOGEN 2.0*  NITRITE NEGATIVE  LEUKOCYTESUR NEGATIVE    Micro Results: Recent Results (from the past 240 hour(s))  Culture, blood (single)     Status: None (Preliminary result)   Collection Time: 07/20/14  8:45 PM  Result Value Ref Range Status   Specimen Description BLOOD LEFT HAND  Final   Special Requests BOTTLES DRAWN AEROBIC ONLY 5CC  Final   Culture   Final    GRAM POSITIVE COCCI IN CLUSTERS Note: Gram Stain Report Called to,Read Back By and Verified With:  TO KIM ROBERTSON @ 1258AM ON 07/22/14 ROHAL Performed at [facility]    Report Status PENDING  Incomplete  MRSA PCR Screening     Status: None   Collection Time: 07/22/14  8:15 PM  Result Value Ref Range Status   MRSA by PCR NEGATIVE NEGATIVE Final    Comment:        The GeneXpert MRSA Assay (FDA approved for NASAL specimens only), is one component of a comprehensive MRSA colonization surveillance program. It is not intended to diagnose MRSA infection nor to guide or monitor treatment for MRSA infections.    Studies/Results: Dg Chest 2 View  07/22/2014   CLINICAL DATA:  Fever 3 days.  Redness of  right tibia.  EXAM: CHEST  2 VIEW  COMPARISON:  02/12/2009  FINDINGS: The heart size and mediastinal contours are within normal limits. Both lungs are clear. The visualized skeletal structures are unremarkable.  IMPRESSION: No active cardiopulmonary disease.   Electronically Signed   By: Elberta Fortis M.D.   On: 07/22/2014 18:50   Medications: I have reviewed the patient's current medications. Scheduled Meds: . sodium chloride   Intravenous STAT  . cefTRIAXone (ROCEPHIN)  IV  1 g Intravenous Q24H  . enoxaparin (LOVENOX) injection  40 mg Subcutaneous Q24H  . hydrocerin   Topical BID  . vancomycin  1,500 mg Intravenous Q8H   Continuous Infusions:  PRN Meds:.acetaminophen, HYDROcodone-acetaminophen, ibuprofen, nivea, ondansetron (ZOFRAN) IV Assessment/Plan: Principal Problem:   Bacteremia Active Problems:   Morbid obesity   Cellulitis of right lower extremity   Elevated glucose   Fissure in skin of foot   Thrombocytopenia  34 hyo female with no pmh here with right leg cellulitis.  Right leg cellulitis - with possible sepsis - had WBC and fever few days ago. Has generalized malaise, body ache, was hypotension in the past. Currently afebrile, no whtie count, BP normal. Has right less is significantly erythematous with warmth. No purulence. No abscess. Negative for DVT. - intiially was on vanc. Will d/c vanc since bcx from 07/20/14 is growing coag neg staph - continue ceftriaxone for right leg cellulitis. - ibuprofen+tylenol+norco for pain/fever - eucerin for dry fissure of feet - f/up bcx from 07/22/14.  Thrombocytopenia - likely 2/2 to sepsis or could be just normal variation from lab draw - will repeat lab.   Mild hemoglobin drop - likely 2/2 to hemo dilution? - repeat CBC pending  False positive bcx x1 from 07/20/14 - called micro lab and confirmed it's coag neg staph - likely contaminant. - d/ced vanc.  Pre-diabetes - hgba1c 5.9.  -rec life style modification. Recheck another  later outpatient when she is not sick.   Code: full Diet: heart healthy  Dispo: Disposition is deferred at this time, awaiting improvement of current medical problems.  Anticipated discharge in approximately 1-2 day(s).   The patient does have a current PCP (No Pcp Per Patient) and does need an [facility] hospital follow-up appointment after discharge.  The patient does have transportation limitations that hinder transportation to clinic appointments.  .Services Needed at time of discharge: Y = Yes, Blank = No PT:   OT:   RN:   Equipment:  Other:     LOS: 1 day   Hyacinth Meekerasrif Axten Pascucci, MD 07/23/2014, 6:52 AM

## 2014-07-23 NOTE — Progress Notes (Signed)
Utilization review completed.  

## 2014-07-24 DIAGNOSIS — D649 Anemia, unspecified: Secondary | ICD-10-CM

## 2014-07-24 LAB — CBC
HCT: 37.5 % (ref 36.0–46.0)
HEMOGLOBIN: 11.9 g/dL — AB (ref 12.0–15.0)
MCH: 26.7 pg (ref 26.0–34.0)
MCHC: 31.7 g/dL (ref 30.0–36.0)
MCV: 84.1 fL (ref 78.0–100.0)
Platelets: 165 10*3/uL (ref 150–400)
RBC: 4.46 MIL/uL (ref 3.87–5.11)
RDW: 13.9 % (ref 11.5–15.5)
WBC: 6.1 10*3/uL (ref 4.0–10.5)

## 2014-07-24 LAB — VITAMIN B12: VITAMIN B 12: 646 pg/mL (ref 211–911)

## 2014-07-24 LAB — FERRITIN: Ferritin: 161 ng/mL (ref 10–291)

## 2014-07-24 LAB — FOLATE: FOLATE: 8.8 ng/mL

## 2014-07-24 MED ORDER — CEPHALEXIN 500 MG PO CAPS
500.0000 mg | ORAL_CAPSULE | Freq: Four times a day (QID) | ORAL | Status: DC
  Filled 2014-07-24 (×2): qty 1

## 2014-07-24 MED ORDER — CEPHALEXIN 500 MG PO CAPS
500.0000 mg | ORAL_CAPSULE | Freq: Four times a day (QID) | ORAL | Status: DC
  Administered 2014-07-24 – 2014-07-25 (×2): 500 mg via ORAL
  Filled 2014-07-24 (×6): qty 1

## 2014-07-24 MED ORDER — CEPHALEXIN 500 MG PO CAPS
500.0000 mg | ORAL_CAPSULE | Freq: Four times a day (QID) | ORAL | Status: DC
  Filled 2014-07-24 (×4): qty 1

## 2014-07-24 MED ORDER — CEFTRIAXONE SODIUM IN DEXTROSE 20 MG/ML IV SOLN
1.0000 g | Freq: Once | INTRAVENOUS | Status: AC
  Administered 2014-07-24: 1 g via INTRAVENOUS
  Filled 2014-07-24: qty 50

## 2014-07-24 MED ORDER — CEFTRIAXONE SODIUM IN DEXTROSE 20 MG/ML IV SOLN
1.0000 g | Freq: Once | INTRAVENOUS | Status: DC
  Filled 2014-07-24: qty 50

## 2014-07-24 NOTE — Progress Notes (Signed)
Subjective:   Pt seen and examined in AM. No acute events overnight. She reports improved right LE pain, erythema, swelling, and warmth. She denies fever, chills, fatigue, or body aches. She has mild headache and constipation but denies abdominal pain, nausea, vomiting, or urinary symptoms. She is able to ambulate without difficulty.    Objective: Vital signs in last 24 hours: Filed Vitals:   07/23/14 0527 07/23/14 0648 07/23/14 1340 07/23/14 2139  BP: 125/68  124/59 128/65  Pulse: 95     Temp: 98.9 F (37.2 C)  98 F (36.7 C) 98.5 F (36.9 C)  TempSrc: Oral  Oral Oral  Resp: Height:      Weight:  325 lb 2.9 oz (147.5 kg)    SpO2: 100%  100% 100%   Weight change:   Intake/Output Summary (Last 24 hours) at 07/24/14 2130 Last data filed at 07/23/14 2229  Gross per 24 hour  Intake    480 ml  Output      0 ml  Net    480 ml   PHYSICAL EXAMINATION: General: NAD, morbidly obese Heart: Distant heart sounds. Normal rate and rhythm.   Lungs:Clear to auscultation bilaterally with no wheezing, ronchi, or rales Abdomen: Soft, non-tender, non-distended, with normal BS Extremities: Right LE erythema, warmth, swelling, and tenderness to palpation with no purulent discharge Neuro: A & O x3   Lab Results: Basic Metabolic Panel:  Recent Labs Lab 07/22/14 1303 07/22/14 1806 07/23/14 0735  NA 137  --  138  K 3.7  --  3.9  CL 107  --  108  CO2 23  --  21  GLUCOSE 109*  --  92  BUN 8  --  6  CREATININE 0.82  --  0.70  CALCIUM 8.2*  --  8.2*  MG  --  1.9  --   PHOS  --  2.9  --    Liver Function Tests:  Recent Labs Lab 07/20/14 2045 07/22/14 1303  AST 17 22  ALT 15 17  ALKPHOS 43 40  BILITOT 0.9 0.5  PROT 7.4 6.3  ALBUMIN 3.8 3.1*   CBC:  Recent Labs Lab 07/20/14 2045 07/22/14 1303 07/23/14 0735 07/24/14 0528  WBC 14.9* 5.1 5.5 6.1  NEUTROABS 13.6* 3.6  --   --   HGB 14.1 11.9* 11.7* 11.9*  HCT 43.5 37.5 36.6 37.5  MCV 83.2 84.3 83.4 84.1    PLT 173 128* 144* 165   Hemoglobin A1C:  Recent Labs Lab 07/22/14 1810  HGBA1C 5.9*   Fasting Lipid Panel:  Recent Labs Lab 07/22/14 1827  CHOL 132  HDL 33*  LDLCALC 82  TRIG 84  CHOLHDL 4.0   Anemia Panel:  Recent Labs Lab 07/23/14 1050  VITAMINB12 646  FOLATE 8.8  FERRITIN 161  TIBC 224*  IRON 16*  RETICCTPCT 1.2   Urine Drug Screen: Drugs of Abuse     Component Value Date/Time   LABOPIA POSITIVE* 07/23/2014 1030   COCAINSCRNUR NONE DETECTED 07/23/2014 1030   LABBENZ NONE DETECTED 07/23/2014 1030   AMPHETMU NONE DETECTED 07/23/2014 1030   THCU NONE DETECTED 07/23/2014 1030   LABBARB NONE DETECTED 07/23/2014 1030    Urinalysis:  Recent Labs Lab 07/20/14 2300  COLORURINE AMBER*  LABSPEC 1.020  PHURINE 7.0  GLUCOSEU NEGATIVE  HGBUR NEGATIVE  BILIRUBINUR NEGATIVE  KETONESUR NEGATIVE  PROTEINUR NEGATIVE  UROBILINOGEN 2.0*  NITRITE NEGATIVE  LEUKOCYTESUR NEGATIVE     Micro Results: Recent Results (  from the past 240 hour(s))  Culture, blood (single)     Status: None   Collection Time: 07/20/14  8:45 PM  Result Value Ref Range Status   Specimen Description BLOOD LEFT HAND  Final   Special Requests BOTTLES DRAWN AEROBIC ONLY 5CC  Final   Culture   Final    STAPHYLOCOCCUS SPECIES (COAGULASE NEGATIVE) Note: THE SIGNIFICANCE OF ISOLATING THIS ORGANISM FROM A SINGLE SET OF BLOOD CULTURES WHEN MULTIPLE SETS ARE DRAWN IS UNCERTAIN. PLEASE NOTIFY THE MICROBIOLOGY DEPARTMENT WITHIN ONE WEEK IF SPECIATION AND SENSITIVITIES ARE REQUIRED. Note: Gram Stain Report Called to,Read Back By and Verified With:  TO KIM ROBERTSON @ 1258AM ON 07/22/14 ROHAL Performed at [facility]    Report Status 07/23/2014 FINAL  Final  Culture, blood (routine x 2)     Status: None (Preliminary result)   Collection Time: 07/22/14  1:03 PM  Result Value Ref Range Status   Specimen Description BLOOD RIGHT ANTECUBITAL  Final   Special Requests BOTTLES DRAWN AEROBIC AND  ANAEROBIC 5MLS  Final   Culture   Final           BLOOD CULTURE RECEIVED NO GROWTH TO DATE CULTURE WILL BE HELD FOR 5 DAYS BEFORE ISSUING A FINAL NEGATIVE REPORT Performed at [facility]    Report Status PENDING  Incomplete  Culture, blood (routine x 2)     Status: None (Preliminary result)   Collection Time: 07/22/14  1:10 PM  Result Value Ref Range Status   Specimen Description BLOOD LEFT FOREARM  Final   Special Requests BOTTLES DRAWN AEROBIC AND ANAEROBIC 5CC  Final   Culture   Final           BLOOD CULTURE RECEIVED NO GROWTH TO DATE CULTURE WILL BE HELD FOR 5 DAYS BEFORE ISSUING A FINAL NEGATIVE REPORT Performed at [facility]    Report Status PENDING  Incomplete  MRSA PCR Screening     Status: None   Collection Time: 07/22/14  8:15 PM  Result Value Ref Range Status   MRSA by PCR NEGATIVE NEGATIVE Final    Comment:        The GeneXpert MRSA Assay (FDA approved for NASAL specimens only), is one component of a comprehensive MRSA colonization surveillance program. It is not intended to diagnose MRSA infection nor to guide or monitor treatment for MRSA infections.    Studies/Results: Dg Chest 2 View  07/22/2014   CLINICAL DATA:  Fever 3 days.  Redness of right tibia.  EXAM: CHEST  2 VIEW  COMPARISON:  02/12/2009  FINDINGS: The heart size and mediastinal contours are within normal limits. Both lungs are clear. The visualized skeletal structures are unremarkable.  IMPRESSION: No active cardiopulmonary disease.   Electronically Signed   By: Elberta Fortisaniel  Boyle M.D.   On: 07/22/2014 18:50   Medications: I have reviewed the patient's current medications. Scheduled Meds: . cefTRIAXone (ROCEPHIN)  IV  1 g Intravenous Q24H  . enoxaparin (LOVENOX) injection  40 mg Subcutaneous Q24H  . hydrocerin   Topical BID  . hydrocerin   Topical BID   Continuous Infusions:  PRN Meds:.acetaminophen, HYDROcodone-acetaminophen, ibuprofen, nivea, ondansetron (ZOFRAN)  IV Assessment/Plan:  Non-purulent Right LE Cellulitis - Improving with IV ceftriaxone. Pt is afebrile with no leukocytosis. Source thought to be from right foot fissure in setting of dry skin and morbid obesity.   -Continue Day 3 IV ceftriaxone 1 g daily and transition tonight to PO keflex 500 mg Q 6 hr for  4 days -Follow-up blood cultures x2 from 4/21 -Continue eucerin or nivea cream on feet for dry skin -Continue tylenol 650 mg Q 4 hr PRN pain -Continue ibuprofen 600 mg Q 6 hr PRN pain -Continue norco/vicodin 5-325 mg Q 6 hr PRN pain  Mild Normocytic Anemia - Hg mildly low at 11.9 with no prior history of anemia. Pt with no active bleeding or hemodynamic instability. Anemia panel on 07/23/14 with normal ferritin (161), low TIBC, low iron, and low Tstat.  -Continue to monitor CBC -Monitor for bleeding  Pre-diabetes -A1c 5.9 -Consult dietician  -Encourage lifestyle changes  Morbid Obesity - Pt is 325 lb. -Encourage lifestyle changes    Diet: Regular DVT Ppx: Lovenox Code: Full    Dispo: Disposition is deferred at this time, awaiting improvement of current medical problems.  Anticipated discharge in approximately 1 day(s).   The patient does not have a current PCP (No Pcp Per Patient) and does need an [facility] hospital follow-up appointment after discharge.  The patient does not have transportation limitations that hinder transportation to clinic appointments.  .Services Needed at time of discharge: Y = Yes, Blank = No PT:   OT:   RN:   Equipment:   Other:     LOS: 2 days   Otis Brace, MD 07/24/2014, 7:14 AM

## 2014-07-24 NOTE — Plan of Care (Signed)
Problem: Food- and Nutrition-Related Knowledge Deficit (NB-1.1) Goal: Nutrition education Formal process to instruct or train a patient/client in a skill or to impart knowledge to help patients/clients voluntarily manage or modify food choices and eating behavior to maintain or improve health. Outcome: Completed/Met Date Met:  07/24/14  RD consulted for nutrition education regarding diabetes.     Lab Results  Component Value Date    HGBA1C 5.9* 07/22/2014    RD provided "Plate Method Menu Idea" handout from the Academy of [employer].   Went through brief dietary recall. Pt typically skips breakfast or has tea and honey w/ biscuit. For Lunch she will usually have a sandwich and for dinner she will have a starch/protein/vegetable plate. She drinks water, honey in tea, "a lot of juice" and lemonade.  Discussed importance of controlled and consistent carbohydrate intake throughout the day. Told her to make sure that she does not skip meals d/t likelihood of overeating at next meal. Encouraged her to limit the amount of juice she drinks as this is undoubtedly an obstacle for better glucose control. Told her to eat a piece of fruit instead of a class of juice. Pt has a lot of misconceptions regarding juice, such as it is healthy. She also thought honey was good for her health.   Also recommend trying 0 calorie beverages and switching to whole grains.Provided examples of ways to balance meals/snacks: Have protein and a carb for breakdast instead of only a biscuit or a surgary cereal. Encouraged intake of high-fiber, whole grain whole grains and vegetables.   Expect poor-fair compliance.-Pt said she could not do sugar free drinks because artifical sweeteners give her headaches. Said she doesn't like whole grains. She said that her MD told her it was OK to put honey in coffee and she wanted to keep doing it.   Body mass index is 57.62 kg/(m^2). Pt meets criteria for Morbidly Obese based on  current BMI.  Current diet order is Regular, patient is consuming approximately 100% of meals at this time. Labs and medications reviewed. No further nutrition interventions warranted at this time. RD contact information provided. If additional nutrition issues arise, please re-consult RD.  Burtis Junes RD, LDN Nutrition Pager: [phone] 07/24/2014 1:38 PM

## 2014-07-25 MED ORDER — CEPHALEXIN 500 MG PO CAPS: 500.0000 mg | ORAL_CAPSULE | Freq: Four times a day (QID) | ORAL | Status: DC

## 2014-07-25 MED ORDER — HYDROCERIN EX CREA: 1.0000 "application " | TOPICAL_CREAM | Freq: Two times a day (BID) | CUTANEOUS | Status: DC | PRN

## 2014-07-25 NOTE — Progress Notes (Signed)
Subjective:   Pt seen and examined in AM. No acute events overnight. She reports significantly improved right LE pain, erythema, swelling, and warmth. She denies fever, chills, fatigue, or body aches. She has mild headache, nausea, and constipation but denies abdominal pain, vomiting, or urinary symptoms. She is able to ambulate without difficulty. She is ready to go home.    Objective: Vital signs in last 24 hours: Filed Vitals:   07/23/14 2139 07/24/14 1406 07/24/14 2100 07/25/14 0542  BP: 128/65 125/64 122/70 142/70  Pulse:  79 81 78  Temp: 98.5 F (36.9 C) 97.4 F (36.3 C) 98.4 F (36.9 C) 98.4 F (36.9 C)  TempSrc: Oral Oral  Oral  Resp: 17 20 17 16   Height:      Weight:      SpO2: 100% 100% 100% 100%   Weight change:   Intake/Output Summary (Last 24 hours) at 07/25/14 [id number] Last data filed at 07/24/14 1853  Gross per 24 hour  Intake    960 ml  Output      0 ml  Net    960 ml   PHYSICAL EXAMINATION: General: NAD, morbidly obese Heart: Distant heart sounds. Normal rate and rhythm.   Lungs:Clear to auscultation bilaterally with no wheezing, ronchi, or rales Abdomen: Soft, non-tender, non-distended, with normal BS Extremities: Right LE erythema, warmth, swelling, and tenderness to palpation with no purulent discharge Neuro: A & O x3      Lab Results: Basic Metabolic Panel:  Recent Labs Lab 07/22/14 1303 07/22/14 1806 07/23/14 0735  NA 137  --  138  K 3.7  --  3.9  CL 107  --  108  CO2 23  --  21  GLUCOSE 109*  --  92  BUN 8  --  6  CREATININE 0.82  --  0.70  CALCIUM 8.2*  --  8.2*  MG  --  1.9  --   PHOS  --  2.9  --    Liver Function Tests:  Recent Labs Lab 07/20/14 2045 07/22/14 1303  AST 17 22  ALT 15 17  ALKPHOS 43 40  BILITOT 0.9 0.5  PROT 7.4 6.3  ALBUMIN 3.8 3.1*   CBC:  Recent Labs Lab 07/20/14 2045 07/22/14 1303 07/23/14 0735 07/24/14 0528  WBC 14.9* 5.1 5.5 6.1  NEUTROABS 13.6* 3.6  --   --   HGB 14.1 11.9* 11.7*  11.9*  HCT 43.5 37.5 36.6 37.5  MCV 83.2 84.3 83.4 84.1  PLT 173 128* 144* 165   Hemoglobin A1C:  Recent Labs Lab 07/22/14 1810  HGBA1C 5.9*   Fasting Lipid Panel:  Recent Labs Lab 07/22/14 1827  CHOL 132  HDL 33*  LDLCALC 82  TRIG 84  CHOLHDL 4.0   Anemia Panel:  Recent Labs Lab 07/23/14 1050  VITAMINB12 646  FOLATE 8.8  FERRITIN 161  TIBC 224*  IRON 16*  RETICCTPCT 1.2   Urine Drug Screen: Drugs of Abuse     Component Value Date/Time   LABOPIA POSITIVE* 07/23/2014 1030   COCAINSCRNUR NONE DETECTED 07/23/2014 1030   LABBENZ NONE DETECTED 07/23/2014 1030   AMPHETMU NONE DETECTED 07/23/2014 1030   THCU NONE DETECTED 07/23/2014 1030   LABBARB NONE DETECTED 07/23/2014 1030    Urinalysis:  Recent Labs Lab 07/20/14 2300  COLORURINE AMBER*  LABSPEC 1.020  PHURINE 7.0  GLUCOSEU NEGATIVE  HGBUR NEGATIVE  BILIRUBINUR NEGATIVE  KETONESUR NEGATIVE  PROTEINUR NEGATIVE  UROBILINOGEN 2.0*  NITRITE NEGATIVE  LEUKOCYTESUR NEGATIVE  Micro Results: Recent Results (from the past 240 hour(s))  Culture, blood (single)     Status: None   Collection Time: 07/20/14  8:45 PM  Result Value Ref Range Status   Specimen Description BLOOD LEFT HAND  Final   Special Requests BOTTLES DRAWN AEROBIC ONLY 5CC  Final   Culture   Final    STAPHYLOCOCCUS SPECIES (COAGULASE NEGATIVE) Note: THE SIGNIFICANCE OF ISOLATING THIS ORGANISM FROM A SINGLE SET OF BLOOD CULTURES WHEN MULTIPLE SETS ARE DRAWN IS UNCERTAIN. PLEASE NOTIFY THE MICROBIOLOGY DEPARTMENT WITHIN ONE WEEK IF SPECIATION AND SENSITIVITIES ARE REQUIRED. Note: Gram Stain Report Called to,Read Back By and Verified With:  TO KIM ROBERTSON @ 1258AM ON 07/22/14 ROHAL Performed at [facility]    Report Status 07/23/2014 FINAL  Final  Culture, blood (routine x 2)     Status: None (Preliminary result)   Collection Time: 07/22/14  1:03 PM  Result Value Ref Range Status   Specimen Description BLOOD RIGHT  ANTECUBITAL  Final   Special Requests BOTTLES DRAWN AEROBIC AND ANAEROBIC  Final   Culture   Final           BLOOD CULTURE RECEIVED NO GROWTH TO DATE CULTURE WILL BE HELD FOR 5 DAYS BEFORE ISSUING A FINAL NEGATIVE REPORT Performed at [facility]    Report Status PENDING  Incomplete  Culture, blood (routine x 2)     Status: None (Preliminary result)   Collection Time: 07/22/14  1:10 PM  Result Value Ref Range Status   Specimen Description BLOOD LEFT FOREARM  Final   Special Requests BOTTLES DRAWN AEROBIC AND ANAEROBIC 5CC  Final   Culture   Final           BLOOD CULTURE RECEIVED NO GROWTH TO DATE CULTURE WILL BE HELD FOR 5 DAYS BEFORE ISSUING A FINAL NEGATIVE REPORT Performed at [facility]    Report Status PENDING  Incomplete  MRSA PCR Screening     Status: None   Collection Time: 07/22/14  8:15 PM  Result Value Ref Range Status   MRSA by PCR NEGATIVE NEGATIVE Final    Comment:        The GeneXpert MRSA Assay (FDA approved for NASAL specimens only), is one component of a comprehensive MRSA colonization surveillance program. It is not intended to diagnose MRSA infection nor to guide or monitor treatment for MRSA infections.    Studies/Results: No results found. Medications: I have reviewed the patient's current medications. Scheduled Meds: . cephALEXin  500 mg Oral 4 times per day  . enoxaparin (LOVENOX) injection  40 mg Subcutaneous Q24H  . hydrocerin   Topical BID   Continuous Infusions:  PRN Meds:.acetaminophen, HYDROcodone-acetaminophen, ibuprofen, nivea, ondansetron (ZOFRAN) IV Assessment/Plan:  Non-purulent Acute Right LE Cellulitis - Resolving. Significantly improved from day of admission. Source of bacterial entry  thought to be from right foot fissure in setting of dry skin and morbid obesity.   -Pt is s/p 3 days of IV ceftriaxone 1 g, currently on Day 4 PO keflex 500 mg Q 6 hr to continue for 3 more days (14 doses) -Follow-up blood  cultures x2 from 4/21 with NGTD -Continue eucerin or nivea cream on feet for dry skin -Continue tylenol 650 mg Q 4 hr PRN pain and ibuprofen 600 mg Q 6 hr PRN pain -Pt to follow-up with [facility]   Mild Normocytic Anemia - Hg on 4/23 mildly low at 11.9 with no prior history of anemia.  Pt with no active bleeding or hemodynamic instability. Anemia panel on 07/23/14 with normal ferritin (161), low TIBC, low iron, and low Tstat.  -Continue to monitor CBC -Monitor for bleeding  Pre-diabetes -A1c 5.9 -Appreciate dietician recommendations   -Encourage lifestyle changes  Morbid Obesity - Pt is 325 lb. -Encourage lifestyle changes    Diet: Regular DVT Ppx: Lovenox Code: Full    Dispo: Today    The patient does not have a current PCP (No Pcp Per Patient) and does need an [facility] hospital follow-up appointment after discharge.  The patient does not have transportation limitations that hinder transportation to clinic appointments.  .Services Needed at time of discharge: Y = Yes, Blank = No PT:  No  OT:  No  RN:  No  Equipment:  No  Other:  None    LOS: 3 days   Otis Brace, MD 07/25/2014, 9:37 AM

## 2014-07-25 NOTE — Discharge Instructions (Signed)
-  Start taking keflex every 6 hr starting today at noon and continue for the next 3 days until the pills are gone for your cellulitis (skin infection). [facility] -Take over the counter ibuprofen or tylenol as needed for pain -Apply eucerin cream to your feet twice a day as needed for dry skin -Please follow-up with [facility] in 1 week  -Glad you are doing better--very nice meeting you!

## 2014-07-25 NOTE — Discharge Summary (Signed)
Name: [name] MRN: [id number] DOB: 06-25-1980 34 y.o. PCP: No Pcp Per Patient  Date of Admission: 07/22/2014 11:51 AM Date of Discharge: 07/25/2014 Attending Physician: Genene Churn. Granfortuna MD  Discharge Diagnosis:  Non-purulent Acute Right LE Cellulitis Mild Normocytic Anemia  Pre-diabetes Morbid Obesity  HIV Screening DVT Prophylaxis     Discharge Medications:   Medication List    STOP taking these medications        Butoconazole Nitrate (1 Dose) 2 % Crea     cimetidine 400 MG tablet  Commonly known as:  TAGAMET     esomeprazole 10 MG packet  Commonly known as:  NEXIUM     fexofenadine 180 MG tablet  Commonly known as:  ALLEGRA     ondansetron 4 MG tablet  Commonly known as:  ZOFRAN      TAKE these medications        acetaminophen 500 MG tablet  Commonly known as:  TYLENOL  Take 1,000 mg by mouth every 6 (six) hours as needed for mild pain or moderate pain.     cephALEXin 500 MG capsule  Commonly known as:  KEFLEX  Take 1 capsule (500 mg total) by mouth every 6 (six) hours.     hydrocerin Crea  Apply 1 application topically 2 (two) times daily as needed.     ibuprofen 200 MG tablet  Commonly known as:  ADVIL,MOTRIN  Take 200 mg by mouth every 6 (six) hours as needed.        Disposition and follow-up:   Ms.[name] was discharged from [facility] in Good condition.  At the hospital follow up visit please address:  1.  Right LE Cellulitis - ensure compliance with keflex (completion date 07/28/14) and resolution. Fissures on feet thought to be source of entry, ensure applying skin moisturizer       Follow-up blood cultures - pending at time of discharge      Mild normocytic anemia - ensure resolution        Morbid Obesity and prediabetes (5.9) - encourage lifestyle changes         2.  Labs / imaging needed at time of follow-up: CBC (Hg)  3.  Pending labs/ test needing follow-up: Blood cultures x 2   Follow-up  Appointments:     Follow-up Information    Schedule an appointment as soon as possible for a visit with [facility]    .   Why:  Please schedule follow-up in 1 week   Contact information:   [address] [place] [address] [phone]      Discharge Instructions:   -Start taking keflex every 6 hr starting today at noon and continue for the next 3 days until the pills are gone for your cellulitis (skin infection). It should cost $10 at [facility]. -Take over the counter ibuprofen or tylenol as needed for pain -Apply eucerin cream to your feet twice a day as needed for dry skin -Please follow-up with [facility] in 1 week  -Glad you are doing better--very nice meeting you!   Consultations: None  Procedures Performed:  Dg Chest 2 View  07/22/2014   CLINICAL DATA:  Fever 3 days.  Redness of right tibia.  EXAM: CHEST  2 VIEW  COMPARISON:  02/12/2009  FINDINGS: The heart size and mediastinal contours are within normal limits. Both lungs are clear. The visualized skeletal structures are unremarkable.  IMPRESSION: No active  cardiopulmonary disease.   Electronically Signed   By: Elberta Fortis M.D.   On: 07/22/2014 18:50     Admission HPI: Original Author Tasrif Ahmed MD  34 yo female with no past medical hx. Here with right leg pain and 1 positive BCX GPCocci in clusters.   Recently came in with fever/chills/n/v/ 07/20/2014. Thought to be 2/2 to viral at that time.Had leukocytosis 14.9. Had fever 101.6 and some ?hypotension at 68/28 at that time. It recovered on manual BP check, and also got some IVF in the ED. Was told to take zofran, tylenol, motrin, and PO fluid. Bcx was checked only 1x. That culture is growing . She was told to come back due to the positive blood culture (only one bcx was drawn) growing gram positive cocci in clusters.   Patient continues to have body ache, generalized. Denies any cough, fever, chills,  n/v/diarrhea, dysuria. Denies any sick contacts. No flu shot. No travels.  Has chronic lower back pain but now also having pain on right leg, with redness and warmth. Pain in 5/10 after receiving some dilaudid. Negative for DVT. No injuries or bug bites, scratching. Does have cracked feet bilaterally. No hx of diabetes but has not been tested/does not have a PCP. Denies any IV drug use.  Had 2010 1 out of 2 bcx + for staph aureus, thought to be contaminant. Was here at that time with endometriosis.    Hospital Course by problem list:   Non-purulent Acute Right LE Cellulitis - Pt presented with 1-day history of right LE pain, erythema, swelling, and warmth found to have non-purulent acute right LE cellulitis. She was previously seen in ED on 07/20/14 for fevers and generalized body aches for which 1 blood culture was taken that subsequently resulted during current hospitalization on 07/23/14 as coagulase negative staphylococcus species thought to be due to skin contaminant. Doppler US of right LE revealed no evidence of DVT. She received IV vancomycin initially that was then transitioned to IV ceftriaxone 1 g daily which she received for 3 days for non-purulent cellulitis. Her symptoms significant improved and was transitioned to oral keflex 500 mg Q 6 hr for 3 additional days for total 7 days of antibiotics. On day of discharge her cellulitis was significantly improved from admission, picture from day of discharge is posted below. Etiology of cellulitis thought to be due to bacterial entryfrom right foot fissure in setting of dry skin and morbid obesity. She received skin moisturizer during hospitalization and instructed to continue skin hydration on discharge. Blood cultures x2 from 4/21 were pending at time of discharge (NGTD). Her pain was well-controlled during hospitalization and was instructed to take OTC tylenol or ibuprofen as needed for pain. She was able to ambulate without difficulty on day of  discharge. She is to follow-up with [facility] to assess resolution of cellulitis in 1 week.       Mild Normocytic Anemia - Pt with Hg range of 11.7 -14.1 with baseline 14 during hospitalization with no prior history of anemia. Pt with no active bleeding or hemodynamic instability during hospitalization. Anemia panel on 07/23/14 with normal ferritin (161), low TIBC (224), low iron (16), and low Tstat (7%).Folate and B12 levels were normal. Pt to have outpatient follow-up to recheck CBC to ensure resolution of mild anemia.    Pre-diabetes - Pt with A1c of 5.9 consistent with pre-diabetes. Pt with serum glucose range of 92-119 during hospitalization. Pt with normal lipid panel on 07/22/14 with  LDL 82 at goal <100. Pt was seen by registered dietician during hospitalization and encouraged on lifestyle changes.   Morbid Obesity - Pt with BMI of 57.62 consistent with morbid obesity. Pt was counseled on lifestyle changes during hospitalization.    HIV Screening - Pt tested negative for HIV during hospitalization.     DVT Prophylaxis - Doppler US of right LE with no evidence of DVT. Pt received lovenox during hospitalization with no evidence of DVT during hospitalization.    Discharge Vitals:   BP 142/70 mmHg  Pulse 78  Temp(Src) 98.4 F (36.9 C) (Oral)  Resp 16  Ht 5\' 3"  (1.6 m)  Wt 325 lb 2.9 oz (147.5 kg)  BMI 57.62 kg/m2  SpO2 100%  LMP 07/08/2014  Discharge Labs:  No results found for this or any previous visit (from the past 24 hour(s)).  Signed: Otis BraceMarjan Mancil Pfenning, MD 07/25/2014, 10:29 AM    Services Ordered on Discharge: None Equipment Ordered on Discharge: None

## 2014-07-28 LAB — CULTURE, BLOOD (ROUTINE X 2)
CULTURE: NO GROWTH
Culture: NO GROWTH

## 2014-07-30 ENCOUNTER — Encounter: Payer: Self-pay | Admitting: Family Medicine

## 2014-07-30 ENCOUNTER — Ambulatory Visit: Payer: Medicaid Other | Attending: Family Medicine | Admitting: Family Medicine

## 2014-07-30 VITALS — BP 123/81 | HR 86 | Temp 98.6°F | Resp 18 | Ht 63.0 in | Wt 339.0 lb

## 2014-07-30 DIAGNOSIS — K5909 Other constipation: Secondary | ICD-10-CM

## 2014-07-30 DIAGNOSIS — L03115 Cellulitis of right lower limb: Secondary | ICD-10-CM | POA: Insufficient documentation

## 2014-07-30 DIAGNOSIS — K5900 Constipation, unspecified: Secondary | ICD-10-CM | POA: Diagnosis not present

## 2014-07-30 MED ORDER — LACTULOSE 10 GM/15ML PO SOLN: 10.0000 g | Freq: Three times a day (TID) | ORAL | Status: DC

## 2014-07-30 NOTE — Progress Notes (Signed)
Subjective:    Patient ID: [name], female    DOB: 02-28-1981, 34 y.o.   MRN: [id number]  HPI  Admit date:07/22/14 Discharge date:07/25/14  [name] is a 34 year old female with no past medical history who [facility].  On initial presentation she had a fever of 101.6 and leukocytosis of 14.9 and this was treated as a viral infection and symptomatic therapy advised but blood culture which had been drawn at the time came back positive for Staph and she was called to present for admission.  On second presentation she was then noticed to have pain in the right leg with warmth and redness. Lower extremity Doppler was negative for DVT. She was placed on IV vancomycin which was changed to IV ceftriaxone and she was transitioned to Keflex when her symptoms improved. White blood cells was down to 6.1 at the time of discharge and subsequent cultures done after that were negative 2 and initial positive culture was thought to be secondary to contaminant.  Of note she was negative for MRSA.   Interval History: She reports the swelling in her right lower extremity gotten a lot better and the redness has reduced significantly. She does complain of being constipated and hasn't moved her bowels in 2 weeks; this is her usual pattern  Past Medical History  Diagnosis Date  . Complication of anesthesia     "makes me shake"  . Arthritis     "back; right foot; left ankle" (07/22/2014)  . Chronic lower back pain   . Cellulitis of leg, right 07/2014    Past Surgical History  Procedure Laterality Date  . Cesarean section  1994; 2010  . Dilation and curettage of uterus  ~ 2008    History   Social History  . Marital Status: Single    Spouse Name: N/A  . Number of Children: N/A  . Years of Education: N/A   Occupational History  . Not on file.   Social History Main Topics  . Smoking status: Never Smoker   . Smokeless tobacco: Never Used  . Alcohol  Use: No  . Drug Use: No  . Sexual Activity: No   Other Topics Concern  . Not on file   Social History Narrative    Allergies  Allergen Reactions  . Penicillins     hives    Current Outpatient Prescriptions on File Prior to Visit  Medication Sig Dispense Refill  . acetaminophen (TYLENOL) 500 MG tablet Take 1,000 mg by mouth every 6 (six) hours as needed for mild pain or moderate pain.    . hydrocerin (EUCERIN) CREA Apply 1 application topically 2 (two) times daily as needed.  0  . ibuprofen (ADVIL,MOTRIN) 200 MG tablet Take 200 mg by mouth every 6 (six) hours as needed.      . cephALEXin (KEFLEX) 500 MG capsule Take 1 capsule (500 mg total) by mouth every 6 (six) hours. (Patient not taking: Reported on 07/30/2014) 14 capsule 0   No current facility-administered medications on file prior to visit.     Review of Systems  Constitutional: Negative for activity change, appetite change and fatigue.  HENT: Negative for congestion, sinus pressure and sore throat.   Eyes: Negative for visual disturbance.  Respiratory: Negative for cough, chest tightness, shortness of breath and wheezing.   Cardiovascular: Negative for chest pain and palpitations.  Gastrointestinal: Positive for constipation. Negative for abdominal pain and abdominal distention.  Endocrine: Negative  for polydipsia.  Genitourinary: Negative for dysuria and frequency.  Musculoskeletal: Negative for back pain and arthralgias.  Skin: Negative for rash.  Neurological: Negative for tremors, light-headedness and numbness.  Hematological: Does not bruise/bleed easily.  Psychiatric/Behavioral: Negative for behavioral problems and agitation.         Objective: Filed Vitals:   07/30/14 1423  BP: 123/81  Pulse: 86  Temp: 98.6 F (37 C)  Resp: 18      Physical Exam  Constitutional: She is oriented to person, place, and time. She appears well-developed and well-nourished. No distress.  Obese  HENT:  Head:  Normocephalic.  Right Ear: External ear normal.  Left Ear: External ear normal.  Nose: Nose normal.  Mouth/Throat: Oropharynx is clear and moist.  Eyes: Conjunctivae and EOM are normal. Pupils are equal, round, and reactive to light.  Neck: Normal range of motion. No JVD present.  Cardiovascular: Normal rate, regular rhythm, normal heart sounds and intact distal pulses.  Exam reveals no gallop.   No murmur heard. Pulmonary/Chest: Effort normal and breath sounds normal. No respiratory distress. She has no wheezes. She has no rales. She exhibits no tenderness.  Abdominal: Soft. Bowel sounds are normal. She exhibits no distension and no mass. There is no tenderness.  Musculoskeletal: Normal range of motion. She exhibits no edema or tenderness.  Neurological: She is alert and oriented to person, place, and time. She has normal reflexes.  Skin: Skin is warm and dry. She is not diaphoretic.  Psychiatric: She has a normal mood and affect.            Assessment & Plan:   34 year old female patient with right lower extremity cellulitis who has completed a course of Keflex now reports improvement in symptoms.  Right lower extremity cellulitis: Resolved.  Constipation: Dietary modification advised to increase fiber intake, fluids, cut back on starches.  Morbid obesity: Advised to cut back on portion sizes and increase physical activity. Based on lactulose; can also use prune juice.

## 2014-07-30 NOTE — Progress Notes (Signed)
Patient in ED on 4/19. Patient had fainted, had fever 104, diagnosed with bacterial infection in right leg. Completed antibiotics yesterday. Patient denies pain at this time. Patient has not had a bowel movement in over 2 weeks.

## 2014-07-30 NOTE — Patient Instructions (Addendum)
Obesity Obesity is defined as having too much total body fat and a body mass index (BMI) of 30 or more. BMI is an estimate of body fat and is calculated from your height and weight. Obesity happens when you consume more calories than you can burn by exercising or performing daily physical tasks. Prolonged obesity can cause major illnesses or emergencies, such as:   Stroke.  Heart disease.  Diabetes.  Cancer.  Arthritis.  High blood pressure (hypertension).  High cholesterol.  Sleep apnea.  Erectile dysfunction.  Infertility problems. CAUSES   Regularly eating unhealthy foods.  Physical inactivity.  Certain disorders, such as an underactive thyroid (hypothyroidism), Cushing's syndrome, and polycystic ovarian syndrome.  Certain medicines, such as steroids, some depression medicines, and antipsychotics.  Genetics.  Lack of sleep. DIAGNOSIS  A health care provider can diagnose obesity after calculating your BMI. Obesity will be diagnosed if your BMI is 30 or higher.  There are other methods of measuring obesity levels. Some other methods include measuring your skinfold thickness, your waist circumference, and comparing your hip circumference to your waist circumference. TREATMENT  A healthy treatment program includes some or all of the following:  Long-term dietary changes.  Exercise and physical activity.  Behavioral and lifestyle changes.  Medicine only under the supervision of your health care provider. Medicines may help, but only if they are used with diet and exercise programs. An unhealthy treatment program includes:  Fasting.  Fad diets.  Supplements and drugs. These choices do not succeed in long-term weight control.  HOME CARE INSTRUCTIONS   Exercise and perform physical activity as directed by your health care provider. To increase physical activity, try the following:  Use stairs instead of elevators.  Park farther away from store  entrances.  Garden, bike, or walk instead of watching television or using the computer.  Eat healthy, low-calorie foods and drinks on a regular basis. Eat more fruits and vegetables. Use low-calorie cookbooks or take healthy cooking classes.  Limit fast food, sweets, and processed snack foods.  Eat smaller portions.  Keep a daily journal of everything you eat. There are many free websites to help you with this. It may be helpful to measure your foods so you can determine if you are eating the correct portion sizes.  Avoid drinking alcohol. Drink more water and drinks without calories.  Take vitamins and supplements only as recommended by your health care provider.  Weight-loss support groups, Government social research officerregistered dietitians, counselors, and stress reduction education can also be very helpful. SEEK IMMEDIATE MEDICAL CARE IF:  You have chest pain or tightness.  You have trouble breathing or feel short of breath.  You have weakness or leg numbness.  You feel confused or have trouble talking.  You have sudden changes in your vision. MAKE SURE YOU:  Understand these instructions.  Will watch your condition.  Will get help right away if you are not doing well or get worse. Document Released: 04/26/2004 Document Revised: 08/03/2013 Document Reviewed: 04/25/2011 [facility] Patient Information 2015 [place]. This information is not intended to replace advice given to you by your health care provider. Make sure you discuss any questions you have with your health care provider. Constipation Constipation is when a person has fewer than three bowel movements a week, has difficulty having a bowel movement, or has stools that are dry, hard, or larger than normal. As people grow older, constipation is more common. If you try to fix constipation with medicines that make you have a bowel  movement (laxatives), the problem may get worse. Long-term laxative use may cause the muscles of the colon to  become weak. A low-fiber diet, not taking in enough fluids, and taking certain medicines may make constipation worse.  CAUSES   Certain medicines, such as antidepressants, pain medicine, iron supplements, antacids, and water pills.   Certain diseases, such as diabetes, irritable bowel syndrome (IBS), thyroid disease, or depression.   Not drinking enough water.   Not eating enough fiber-rich foods.   Stress or travel.   Lack of physical activity or exercise.   Ignoring the urge to have a bowel movement.   Using laxatives too much.  SIGNS AND SYMPTOMS   Having fewer than three bowel movements a week.   Straining to have a bowel movement.   Having stools that are hard, dry, or larger than normal.   Feeling full or bloated.   Pain in the lower abdomen.   Not feeling relief after having a bowel movement.  DIAGNOSIS  Your health care provider will take a medical history and perform a physical exam. Further testing may be done for severe constipation. Some tests may include:  A barium enema X-ray to examine your rectum, colon, and, sometimes, your small intestine.   A sigmoidoscopy to examine your lower colon.   A colonoscopy to examine your entire colon. TREATMENT  Treatment will depend on the severity of your constipation and what is causing it. Some dietary treatments include drinking more fluids and eating more fiber-rich foods. Lifestyle treatments may include regular exercise. If these diet and lifestyle recommendations do not help, your health care provider may recommend taking over-the-counter laxative medicines to help you have bowel movements. Prescription medicines may be prescribed if over-the-counter medicines do not work.  HOME CARE INSTRUCTIONS   Eat foods that have a lot of fiber, such as fruits, vegetables, whole grains, and beans.  Limit foods high in fat and processed sugars, such as french fries, hamburgers, cookies, candies, and soda.   A  fiber supplement may be added to your diet if you cannot get enough fiber from foods.   Drink enough fluids to keep your urine clear or pale yellow.   Exercise regularly or as directed by your health care provider.   Go to the restroom when you have the urge to go. Do not hold it.   Only take over-the-counter or prescription medicines as directed by your health care provider. Do not take other medicines for constipation without talking to your health care provider first.  SEEK IMMEDIATE MEDICAL CARE IF:   You have bright red blood in your stool.   Your constipation lasts for more than 4 days or gets worse.   You have abdominal or rectal pain.   You have thin, pencil-like stools.   You have unexplained weight loss. MAKE SURE YOU:   Understand these instructions.  Will watch your condition.  Will get help right away if you are not doing well or get worse. Document Released: 12/16/2003 Document Revised: 03/24/2013 Document Reviewed: 12/29/2012 [facility] Patient Information 2015 [place], [place]. This information is not intended to replace advice given to you by your health care provider. Make sure you discuss any questions you have with your health care provider.

## 2014-08-23 ENCOUNTER — Emergency Department (INDEPENDENT_AMBULATORY_CARE_PROVIDER_SITE_OTHER)
Admission: EM | Admit: 2014-08-23 | Discharge: 2014-08-23 | Disposition: A | Discharge status: Home / Self Care | Payer: Self-pay | Source: Home / Self Care | Attending: Family Medicine | Admitting: Family Medicine

## 2014-08-23 ENCOUNTER — Encounter (HOSPITAL_COMMUNITY): Payer: Self-pay | Admitting: Emergency Medicine

## 2014-08-23 DIAGNOSIS — S61511A Laceration without foreign body of right wrist, initial encounter: Secondary | ICD-10-CM

## 2014-08-23 MED ORDER — BACITRACIN ZINC 500 UNIT/GM EX OINT
TOPICAL_OINTMENT | CUTANEOUS | Status: AC
  Filled 2014-08-23: qty 0.9

## 2014-08-23 MED ORDER — TETANUS-DIPHTH-ACELL PERTUSSIS 5-2.5-18.5 LF-MCG/0.5 IM SUSP
INTRAMUSCULAR | Status: AC
  Filled 2014-08-23: qty 0.5

## 2014-08-23 MED ORDER — TETANUS-DIPHTH-ACELL PERTUSSIS 5-2.5-18.5 LF-MCG/0.5 IM SUSP
0.5000 mL | Freq: Once | INTRAMUSCULAR | Status: AC
  Administered 2014-08-23: 0.5 mL via INTRAMUSCULAR

## 2014-08-23 NOTE — Discharge Instructions (Signed)
Thank you for coming in today. ° °Laceration Care, Adult °A laceration is a cut or lesion that goes through all layers of the skin and into the tissue just beneath the skin. °TREATMENT  °Some lacerations may not require closure. Some lacerations may not be able to be closed due to an increased risk of infection. It is important to see your caregiver as soon as possible after an injury to minimize the risk of infection and maximize the opportunity for successful closure. °If closure is appropriate, pain medicines may be given, if needed. The wound will be cleaned to help prevent infection. Your caregiver will use stitches (sutures), staples, wound glue (adhesive), or skin adhesive strips to repair the laceration. These tools bring the skin edges together to allow for faster healing and a better cosmetic outcome. However, all wounds will heal with a scar. Once the wound has healed, scarring can be minimized by covering the wound with sunscreen during the day for 1 full year. °HOME CARE INSTRUCTIONS  °For sutures or staples: °· Keep the wound clean and dry. °· If you were given a bandage (dressing), you should change it at least once a day. Also, change the dressing if it becomes wet or dirty, or as directed by your caregiver. °· Wash the wound with soap and water 2 times a day. Rinse the wound off with water to remove all soap. Pat the wound dry with a clean towel. °· After cleaning, apply a thin layer of the antibiotic ointment as recommended by your caregiver. This will help prevent infection and keep the dressing from sticking. °· You may shower as usual after the first 24 hours. Do not soak the wound in water until the sutures are removed. °· Only take over-the-counter or prescription medicines for pain, discomfort, or fever as directed by your caregiver. °· Get your sutures or staples removed as directed by your caregiver. °For skin adhesive strips: °· Keep the wound clean and dry. °· Do not get the skin adhesive  strips wet. You may bathe carefully, using caution to keep the wound dry. °· If the wound gets wet, pat it dry with a clean towel. °· Skin adhesive strips will fall off on their own. You may trim the strips as the wound heals. Do not remove skin adhesive strips that are still stuck to the wound. They will fall off in time. °For wound adhesive: °· You may briefly wet your wound in the shower or bath. Do not soak or scrub the wound. Do not swim. Avoid periods of heavy perspiration until the skin adhesive has fallen off on its own. After showering or bathing, gently pat the wound dry with a clean towel. °· Do not apply liquid medicine, cream medicine, or ointment medicine to your wound while the skin adhesive is in place. This may loosen the film before your wound is healed. °· If a dressing is placed over the wound, be careful not to apply tape directly over the skin adhesive. This may cause the adhesive to be pulled off before the wound is healed. °· Avoid prolonged exposure to sunlight or tanning lamps while the skin adhesive is in place. Exposure to ultraviolet light in the first year will darken the scar. °· The skin adhesive will usually remain in place for 5 to 10 days, then naturally fall off the skin. Do not pick at the adhesive film. °You may need a tetanus shot if: °· You cannot remember when you had your last tetanus shot. °·   You have never had a tetanus shot. °If you get a tetanus shot, your arm may swell, get red, and feel warm to the touch. This is common and not a problem. If you need a tetanus shot and you choose not to have one, there is a rare chance of getting tetanus. Sickness from tetanus can be serious. °SEEK MEDICAL CARE IF:  °· You have redness, swelling, or increasing pain in the wound. °· You see a red line that goes away from the wound. °· You have yellowish-white fluid (pus) coming from the wound. °· You have a fever. °· You notice a bad smell coming from the wound or dressing. °· Your  wound breaks open before or after sutures have been removed. °· You notice something coming out of the wound such as wood or glass. °· Your wound is on your hand or foot and you cannot move a finger or toe. °SEEK IMMEDIATE MEDICAL CARE IF:  °· Your pain is not controlled with prescribed medicine. °· You have severe swelling around the wound causing pain and numbness or a change in color in your arm, hand, leg, or foot. °· Your wound splits open and starts bleeding. °· You have worsening numbness, weakness, or loss of function of any joint around or beyond the wound. °· You develop painful lumps near the wound or on the skin anywhere on your body. °MAKE SURE YOU:  °· Understand these instructions. °· Will watch your condition. °· Will get help right away if you are not doing well or get worse. °Document Released: 03/19/2005 Document Revised: 06/11/2011 Document Reviewed: 09/12/2010 °[employer]® Patient Information ©2015 [employer]. This information is not intended to replace advice given to you by your health care provider. Make sure you discuss any questions you have with your health care provider. ° °

## 2014-08-23 NOTE — ED Provider Notes (Signed)
[name] is a 34 y.o. female who presents to Urgent Care today for laceration. Patient suffered a laceration to the right wrist this afternoon. She is tempting to carry piece of glass which fell and cut her wrist. She denies any significant bleeding radiating pain weakness or numbness. She thinks her last tetanus shot was over 5 years ago. No fevers or chills nausea vomiting or diarrhea.   Past Medical History  Diagnosis Date  . Complication of anesthesia     "makes me shake"  . Arthritis     "back; right foot; left ankle" (07/22/2014)  . Chronic lower back pain   . Cellulitis of leg, right 07/2014   Past Surgical History  Procedure Laterality Date  . Cesarean section  1994; 2010  . Dilation and curettage of uterus  ~ 2008   History  Substance Use Topics  . Smoking status: Never Smoker   . Smokeless tobacco: Never Used  . Alcohol Use: No   ROS as above Medications: Current Facility-Administered Medications  Medication Dose Route Frequency Provider Last Rate Last Dose  . Tdap (BOOSTRIX) injection 0.5 mL  0.5 mL Intramuscular Once Rodolph BongEvan S Chanta Bauers, MD       Current Outpatient Prescriptions  Medication Sig Dispense Refill  . acetaminophen (TYLENOL) 500 MG tablet Take 1,000 mg by mouth every 6 (six) hours as needed for mild pain or moderate pain.    . cephALEXin (KEFLEX) 500 MG capsule Take 1 capsule (500 mg total) by mouth every 6 (six) hours. (Patient not taking: Reported on 07/30/2014) 14 capsule 0  . hydrocerin (EUCERIN) CREA Apply 1 application topically 2 (two) times daily as needed.  0  . ibuprofen (ADVIL,MOTRIN) 200 MG tablet Take 200 mg by mouth every 6 (six) hours as needed.      . lactulose (CHRONULAC) 10 GM/15ML solution Take 15 mLs (10 g total) by mouth 3 (three) times daily. 946 mL 1   Allergies  Allergen Reactions  . Penicillins     hives     Exam:  BP 134/88 mmHg  Pulse 76  Temp(Src) 97.9 F (36.6 C) (Oral)  Resp 20  SpO2 100%  LMP 08/07/2014 (Exact  Date) Gen: Well NAD Skin: 2 cm shallow laceration of the right wrist. Laceration extends into the dermis but does not involve any deep structures. Hand motion capillary refill pulses and sensation are intact distally  Laceration repair to the right wrist. Consent obtained and timeout performed. 4 mL of lidocaine with epinephrine injected achieving good anesthesia. Skin cleaned with Betadine and draped in the usual sterile fashion. 3 simple and her good sutures using 3-0 Prolene were used to close the wound. Betadine removed. Dressing applied. Patient tolerated the procedure well.  No results found for this or any previous visit (from the past 24 hour(s)). No results found.  Assessment and Plan: 34 y.o. female with right wrist laceration. No self-harm suspected. Repaired with Sutures. Return to clinic in one week for suture removal. Tetanus vaccine provided prior to discharge.  Discussed warning signs or symptoms. Please see discharge instructions. Patient expresses understanding.     Rodolph BongEvan S Sarajean Dessert, MD 08/23/14 2004

## 2014-08-23 NOTE — ED Notes (Signed)
Pt sliced her right wrist with a broken piece of glass when trying to move a glass top desk.  The laceration is clean and dry.

## 2014-09-02 ENCOUNTER — Emergency Department (INDEPENDENT_AMBULATORY_CARE_PROVIDER_SITE_OTHER)
Admission: EM | Admit: 2014-09-02 | Discharge: 2014-09-02 | Disposition: A | Discharge status: Home / Self Care | Payer: Self-pay | Source: Home / Self Care | Attending: Family Medicine | Admitting: Family Medicine

## 2014-09-02 ENCOUNTER — Encounter (HOSPITAL_COMMUNITY): Payer: Self-pay | Admitting: Emergency Medicine

## 2014-09-02 DIAGNOSIS — Z4802 Encounter for removal of sutures: Secondary | ICD-10-CM

## 2014-09-02 DIAGNOSIS — R600 Localized edema: Secondary | ICD-10-CM

## 2014-09-02 NOTE — ED Provider Notes (Signed)
CSN: [id number]     Arrival date & time 09/02/14  1007 History   First MD Initiated Contact with Patient 09/02/14 1057     Chief Complaint  Patient presents with  . Suture / Staple Removal  . Leg Swelling    bilateral   (Consider location/radiation/quality/duration/timing/severity/associated sxs/prior Treatment) HPI  Sutures placed 9 days ago after sustaining a right wrist injury. Applied anabolic ointment intermittently. No tenderness or purulence from the wound. Denies fevers, nausea, vomiting, chest pain, shortness breath. No decreased function or sensation of the right wrist.  Lower extremity edema. Bilateral. Right worse than left. This is been present ever since patient sustained significant cellulitis infection requiring hospitalization and IV antibiotics. Patient is very obese the patient stands on her feet for long periods of time daily..  Past Medical History  Diagnosis Date  . Complication of anesthesia     "makes me shake"  . Arthritis     "back; right foot; left ankle" (07/22/2014)  . Chronic lower back pain   . Cellulitis of leg, right 07/2014   Past Surgical History  Procedure Laterality Date  . Cesarean section  1994; 2010  . Dilation and curettage of uterus  ~ 2008   Family History  Problem Relation Age of Onset  . Hypertension Mother   . Diabetes Maternal Grandfather    History  Substance Use Topics  . Smoking status: Never Smoker   . Smokeless tobacco: Never Used  . Alcohol Use: No   OB History    No data available     Review of Systems Per HPI with all other pertinent systems negative.   Allergies  Penicillins  Home Medications   Prior to Admission medications   Medication Sig Start Date End Date Taking? Authorizing Provider  acetaminophen (TYLENOL) 500 MG tablet Take 1,000 mg by mouth every 6 (six) hours as needed for mild pain or moderate pain.    Historical Provider, MD  cephALEXin (KEFLEX) 500 MG capsule Take 1 capsule (500 mg total) by  mouth every 6 (six) hours. Patient not taking: Reported on 07/30/2014 07/25/14   Otis BraceMarjan Rabbani, MD  hydrocerin (EUCERIN) CREA Apply 1 application topically 2 (two) times daily as needed. 07/25/14   Otis BraceMarjan Rabbani, MD  ibuprofen (ADVIL,MOTRIN) 200 MG tablet Take 200 mg by mouth every 6 (six) hours as needed.      Historical Provider, MD  lactulose (CHRONULAC) 10 GM/15ML solution Take 15 mLs (10 g total) by mouth 3 (three) times daily. 07/30/14   Jaclyn ShaggyEnobong Amao, MD   BP 118/78 mmHg  Pulse 63  Temp(Src) 98.3 F (36.8 C) (Oral)  Resp 16  SpO2 99%  LMP 08/28/2014 Physical Exam Physical Exam  Constitutional: oriented to person, place, and time. appears well-developed and well-nourished. No distress.  HENT:  Head: Normocephalic and atraumatic.  Eyes: EOMI. PERRL.  Neck: Normal range of motion.  Cardiovascular: RRR, no m/r/g, 2+ distal pulses,  Pulmonary/Chest: Effort normal and breath sounds normal. No respiratory distress.  Abdominal: Soft. Bowel sounds are normal. NonTTP, no distension.  Musculoskeletal: Patient is morbidly obese, trace lower extremity bilateral edema.  Neurological: alert and oriented to person, place, and time.  Skin: Right wrist laceration well-healing. 3 Prolene sutures removed without complication. No purulence noted. Nontender to palpation.Marland Kitchen.  Psychiatric: normal mood and affect. behavior is normal. Judgment and thought content normal.   ED Course  Procedures (including critical care time) Labs Review Labs Reviewed - No data to display  Imaging Review No results found.  MDM   1. Visit for suture removal   2. Bilateral leg edema    Sutures removed without difficulty. Well-healing scar. Applied antibiotic ointment and sterile bandage. Discussed detailed wound care instructions over the next several weeks and months and management of scar softening with vitamin E ointment. Lower extremity edema is fairly unremarkable. Suspect that this is likely due to patient's  obesity and venous insufficiency and increased heat from the summer. Patient to use compression stockings, exercise regularly, and elevate her legs. There may be some additional residual swelling from her recent cellulitis.    Ozella Rocks, MD 09/02/14 1140

## 2014-09-02 NOTE — Discharge Instructions (Signed)
Your wound is well-healing. This is [name] were removed today. Please keep the bandage on until tomorrow and then wash the area in the shower as you normally would. He can use a little bit of vitamin E ointment or lotion to help with softening of the scar over time. Please avoid any direct contact to that area over the next couple of weeks as the scar continues to strengthen. The swelling in your legs is likely a combination of something called venous stasis insufficiency, being overweight, recent infection in her leg, and the heat from the summer. Please consider using compression hose. Please elevate her legs at night. Please get out and get regular daily aerobic exercise to help with this.

## 2014-09-02 NOTE — ED Notes (Signed)
Pt here for suture removal from the right wrist.   Area appears to be well healed with no signs of infection.   Pt is also c/o bilateral swelling of feet x 3 weeks.

## 2014-11-13 ENCOUNTER — Emergency Department (HOSPITAL_COMMUNITY)
Admission: EM | Admit: 2014-11-13 | Discharge: 2014-11-13 | Disposition: A | Discharge status: Home / Self Care | Payer: Medicaid Other | Source: Home / Self Care | Attending: Family Medicine | Admitting: Family Medicine

## 2014-11-13 ENCOUNTER — Encounter (HOSPITAL_COMMUNITY): Payer: Self-pay | Admitting: Emergency Medicine

## 2014-11-13 DIAGNOSIS — W57XXXA Bitten or stung by nonvenomous insect and other nonvenomous arthropods, initial encounter: Secondary | ICD-10-CM | POA: Diagnosis not present

## 2014-11-13 DIAGNOSIS — T148 Other injury of unspecified body region: Secondary | ICD-10-CM

## 2014-11-13 MED ORDER — TRIAMCINOLONE ACETONIDE 0.1 % EX CREA: 1.0000 "application " | TOPICAL_CREAM | Freq: Two times a day (BID) | CUTANEOUS | Status: DC

## 2014-11-13 MED ORDER — PERMETHRIN 5 % EX CREA: TOPICAL_CREAM | CUTANEOUS | Status: DC

## 2014-11-13 NOTE — ED Provider Notes (Signed)
CSN: [id number]     Arrival date & time 11/13/14  1504 History   First MD Initiated Contact with Patient 11/13/14 1542     Chief Complaint  Patient presents with  . Rash   (Consider location/radiation/quality/duration/timing/severity/associated sxs/prior Treatment) HPI Comments: 34 year old severely obese female complaining of itchy bumps all over for 2 days. These are relatively small 1-3 mm raised irregular papular type lesions scattered about the extremities and torso. She has no ill symptoms. No systemic symptoms. No fever or chills. No pain. No known exposures. No animals in the house. Her family members do not have similar lesions.   Past Medical History  Diagnosis Date  . Complication of anesthesia     "makes me shake"  . Arthritis     "back; right foot; left ankle" (07/22/2014)  . Chronic lower back pain   . Cellulitis of leg, right 07/2014   Past Surgical History  Procedure Laterality Date  . Cesarean section  1994; 2010  . Dilation and curettage of uterus  ~ 2008   Family History  Problem Relation Age of Onset  . Hypertension Mother   . Diabetes Maternal Grandfather    Social History  Substance Use Topics  . Smoking status: Never Smoker   . Smokeless tobacco: Never Used  . Alcohol Use: No   OB History    No data available     Review of Systems  Constitutional: Negative.   HENT: Negative.   Respiratory: Negative.   Skin: Positive for rash.  Neurological: Negative.     Allergies  Penicillins  Home Medications   Prior to Admission medications   Medication Sig Start Date End Date Taking? Authorizing Provider  acetaminophen (TYLENOL) 500 MG tablet Take 1,000 mg by mouth every 6 (six) hours as needed for mild pain or moderate pain.    Historical Provider, MD  hydrocerin (EUCERIN) CREA Apply 1 application topically 2 (two) times daily as needed. 07/25/14   Otis Brace, MD  ibuprofen (ADVIL,MOTRIN) 200 MG tablet Take 200 mg by mouth every 6 (six) hours as  needed.      Historical Provider, MD  lactulose (CHRONULAC) 10 GM/15ML solution Take 15 mLs (10 g total) by mouth 3 (three) times daily. 07/30/14   Jaclyn Shaggy, MD  permethrin (ELIMITE) 5 % cream Apply to affected area , then rinse off in 8 hours 11/13/14   Hayden Rasmussen, NP  triamcinolone cream (KENALOG) 0.1 % Apply 1 application topically 2 (two) times daily. 11/13/14   Hayden Rasmussen, NP   BP 127/65 mmHg  Pulse 77  Temp(Src) 98.9 F (37.2 C) (Oral)  Resp 16  SpO2 100%  LMP 10/30/2014 Physical Exam  Constitutional: She is oriented to person, place, and time. She appears well-developed and well-nourished. No distress.  Neck: Normal range of motion. Neck supple.  Cardiovascular: Normal rate.   Pulmonary/Chest: Effort normal and breath sounds normal.  Musculoskeletal: Normal range of motion. She exhibits no edema.  Neurological: She is alert and oriented to person, place, and time.  Skin: Skin is warm and dry.  Rash has explained in history of present illness.   Psychiatric: She has a normal mood and affect.  Nursing note and vitals reviewed.   ED Course  Procedures (including critical care time) Labs Review Labs Reviewed - No data to display  Imaging Review No results found.   MDM   1. Multiple insect bites    OTC meds like clairitin or allegra Kenalog cream to affected areas elimite cream tx  x 1. No systemic sx's. No fevers. Does not feel ill. No new meds or foods known. Doubt this is an allergic reaction.    Hayden Rasmussen, NP 11/13/14 1622

## 2014-11-13 NOTE — Discharge Instructions (Signed)
Insect Bite °Mosquitoes, flies, fleas, bedbugs, and many other insects can bite. Insect bites are different from insect stings. A sting is when venom is injected into the skin. Some insect bites can transmit infectious diseases. °SYMPTOMS  °Insect bites usually turn red, swell, and itch for 2 to 4 days. They often go away on their own. °TREATMENT  °Your caregiver may prescribe antibiotic medicines if a bacterial infection develops in the bite. °HOME CARE INSTRUCTIONS °· Do not scratch the bite area. °· Keep the bite area clean and dry. Wash the bite area thoroughly with soap and water. °· Put ice or cool compresses on the bite area. °· Put ice in a plastic bag. °· Place a towel between your skin and the bag. °· Leave the ice on for 20 minutes, 4 times a day for the first 2 to 3 days, or as directed. °· You may apply a baking soda paste, cortisone cream, or calamine lotion to the bite area as directed by your caregiver. This can help reduce itching and swelling. °· Only take over-the-counter or prescription medicines as directed by your caregiver. °· If you are given antibiotics, take them as directed. Finish them even if you start to feel better. °You may need a tetanus shot if: °· You cannot remember when you had your last tetanus shot. °· You have never had a tetanus shot. °· The injury broke your skin. °If you get a tetanus shot, your arm may swell, get red, and feel warm to the touch. This is common and not a problem. If you need a tetanus shot and you choose not to have one, there is a rare chance of getting tetanus. Sickness from tetanus can be serious. °SEEK IMMEDIATE MEDICAL CARE IF:  °· You have increased pain, redness, or swelling in the bite area. °· You see a red line on the skin coming from the bite. °· You have a fever. °· You have joint pain. °· You have a headache or neck pain. °· You have unusual weakness. °· You have a rash. °· You have chest pain or shortness of breath. °· You have abdominal pain,  nausea, or vomiting. °· You feel unusually tired or sleepy. °MAKE SURE YOU:  °· Understand these instructions. °· Will watch your condition. °· Will get help right away if you are not doing well or get worse. °Document Released: 04/26/2004 Document Revised: 06/11/2011 Document Reviewed: 10/18/2010 °ExitCare® Patient Information ©2015 [employer]. This information is not intended to replace advice given to you by your health care provider. Make sure you discuss any questions you have with your health care provider. ° °Bedbugs °Bedbugs are tiny bugs that live in and around beds. During the day, they hide in mattresses and other places near beds. They come out at night and bite people lying in bed. They need blood to live and grow. Bedbugs can be found in beds anywhere. Usually, they are found in places where many people come and go (hotels, shelters, hospitals). It does not matter whether the place is dirty or clean. °Getting bitten by bedbugs rarely causes a medical problem. The biggest problem can be getting rid of them.  This often takes the work of a pest control expert. °CAUSES °· Less use of pesticides. Bedbugs were common before the 1950s. Then, strong pesticides such as DDT nearly wiped them out. Today, these pesticides are not used because they harm the environment and can cause health problems. °· More travel. Besides mattresses, bedbugs can also live   in clothing and luggage. They can come along as people travel from place to place. Bedbugs are more common in certain parts of the world. When people travel to those areas, the bugs can come home with them. °· Presence of birds and bats. Bedbugs often infest birds and bats. If you have these animals in or near your home, bedbugs may infest your house, too. °SYMPTOMS °It does not hurt to be bitten by a bedbug. You will probably not wake up when you are bitten. Bedbugs usually bite areas of the skin that are not covered. Symptoms may show when you wake up, or  they may take a day or more to show up. Symptoms may include: °· Small red bumps on the skin. These might be lined up in a row or clustered in a group. °· A darker red dot in the middle of red bumps. °· Blisters on the skin. There may be swelling and very bad itching. These may be signs of an allergic reaction. This does not happen often. °DIAGNOSIS °Bedbug bites might look and feel like other types of insect bites. The bugs do not stay on the body like ticks or lice. They bite, drop off, and crawl away to hide. Your caregiver will probably: °· Ask about your symptoms. °· Ask about your recent activities and travel. °· Check your skin for bedbug bites. °· Ask you to check at home for signs of bedbugs. You should look for: °¨ Spots or stains on the bed or nearby. This could be from bedbugs that were crushed or from their eggs or waste. °¨ Bedbugs themselves. They are reddish-brown, oval, and flat. They do not fly. They are about the size of an apple seed. °· Places to look for bedbugs include: °¨ Beds. Check mattresses, headboards, box springs, and bed frames. °¨ On drapes and curtains near the bed. °¨ Under carpeting in the bedroom. °¨ Behind electrical outlets. °¨ Behind any wallpaper that is peeling. °¨ Inside luggage. °TREATMENT °Most bedbug bites do not need treatment. They usually go away on their own in a few days. The bites are not dangerous. However, treatment may be needed if you have scratched so much that your skin has become infected. You may also need treatment if you are allergic to bedbug bites. Treatment options include: °· A drug that stops swelling and itching (corticosteroid). Usually, a cream is rubbed on the skin. If you have a bad rash, you may be given a corticosteroid pill. °· Oral antihistamines. These are pills to help control itching. °· Antibiotic medicines. An antibiotic may be prescribed for infected skin. °HOME CARE INSTRUCTIONS  °· Take any medicine prescribed by your caregiver for  your bites. Follow the directions carefully. °· Consider wearing pajamas with long sleeves and pant legs. °· Your bedroom may need to be treated. A pest control expert should make sure the bedbugs are gone. You may need to throw away mattresses or luggage. Ask the pest control expert what you can do to keep the bedbugs from coming back. Common suggestions include: °¨ Putting a plastic cover over your mattress. °¨ Washing and drying your clothes and bedding in hot water and a hot dryer. The temperature should be hotter than 120° F (48.9° C). Bedbugs are killed by high temperatures. °¨ Vacuuming carefully all around your bed. Vacuum in all cracks and crevices where the bugs might hide. Do this often. °¨ Carefully checking all used furniture, bedding, or clothes that you bring into your house. °¨   Eliminating bird nests and bat roosts. °· If you get bedbug bites when traveling, check all your possessions carefully before bringing them into your house. If you find any bugs on clothes or in your luggage, consider throwing those items away. °SEEK MEDICAL CARE IF: °· You have red bug bites that keep coming back. °· You have red bug bites that itch badly. °· You have bug bites that cause a skin rash. °· You have scratch marks that are red and sore. °SEEK IMMEDIATE MEDICAL CARE IF: °You have a fever. °Document Released: 04/21/2010 Document Revised: 06/11/2011 Document Reviewed: 04/21/2010 °ExitCare® Patient Information ©2015 [employer]. This information is not intended to replace advice given to you by your health care provider. Make sure you discuss any questions you have with your health care provider. ° °

## 2014-11-13 NOTE — ED Notes (Signed)
Here with c/o itchy rash all over,denies pain  Started 2 dys ago Unknown reason Small bumps to chest, arms Taking OTC Benadryl

## 2014-11-29 ENCOUNTER — Encounter: Payer: Self-pay | Admitting: Obstetrics

## 2014-11-29 ENCOUNTER — Other Ambulatory Visit (INDEPENDENT_AMBULATORY_CARE_PROVIDER_SITE_OTHER): Payer: Medicaid Other | Admitting: *Deleted

## 2014-11-29 VITALS — BP 129/82 | HR 74 | Wt 342.0 lb

## 2014-11-29 DIAGNOSIS — Z3200 Encounter for pregnancy test, result unknown: Secondary | ICD-10-CM | POA: Diagnosis not present

## 2014-11-29 LAB — POCT URINE PREGNANCY: PREG TEST UR: POSITIVE — AB

## 2014-11-29 NOTE — Progress Notes (Signed)
Pt is in office today for UPT.  Pt states that she has had positive at home test.  Pt states that she has been feeling tired, little energy and nauseated.   Pt states that her LMP is 10-07-14.  With that date she is 7.[redacted] weeks gestation with an EDD of 07-14-15.    Pt made aware of scheduling NOB appt and will see Steward Drone at check out for appt.   Pt was given samples of B-Calm prenatal due to nausea.  Pt advised to try PNV and let office know if nausea gets worse or if she would like Rx sent to pharmacy.  Pt made aware that any urgent needs should be seen at [facility].  Pt made aware that if she has any concerns before NOB appt she is able to call office and speak with clinical staff. Pt states understanding.  BP 129/82 mmHg  Pulse 74  Wt 342 lb (155.13 kg)  LMP 10/07/2014

## 2014-12-09 ENCOUNTER — Telehealth: Payer: Self-pay | Admitting: *Deleted

## 2014-12-09 NOTE — Telephone Encounter (Signed)
Patient state she is suffering from nausea and vomiting and would like medication. 9/8 11:24 Call to patient-  Patient states she is [redacted] weeks pregnant and she is not keeping anything down. Patient states she eats and within 2-3 hours- she is vomiting. Patient has not tried anything for the Nausea and vomiting. Explained to patient that we really try to avoid Rx medications in the first trimester and would like her to try some different over the counter things first. Suggested Bonine or Unisom/B6 regimen along with grazing diet rather than eating meals. Patient voices understanding and will call back if this does not work.

## 2014-12-16 ENCOUNTER — Telehealth: Payer: Self-pay

## 2014-12-16 ENCOUNTER — Ambulatory Visit (INDEPENDENT_AMBULATORY_CARE_PROVIDER_SITE_OTHER): Payer: Medicaid Other

## 2014-12-16 ENCOUNTER — Other Ambulatory Visit: Payer: Self-pay | Admitting: Obstetrics

## 2014-12-16 ENCOUNTER — Ambulatory Visit (INDEPENDENT_AMBULATORY_CARE_PROVIDER_SITE_OTHER): Payer: Medicaid Other | Admitting: Obstetrics

## 2014-12-16 ENCOUNTER — Encounter: Payer: Self-pay | Admitting: Obstetrics

## 2014-12-16 VITALS — BP 135/82 | HR 83 | Temp 98.9°F | Wt 342.0 lb

## 2014-12-16 DIAGNOSIS — Z3687 Encounter for antenatal screening for uncertain dates: Secondary | ICD-10-CM

## 2014-12-16 DIAGNOSIS — Z36 Encounter for antenatal screening of mother: Secondary | ICD-10-CM

## 2014-12-16 DIAGNOSIS — O09521 Supervision of elderly multigravida, first trimester: Secondary | ICD-10-CM

## 2014-12-16 DIAGNOSIS — O219 Vomiting of pregnancy, unspecified: Secondary | ICD-10-CM

## 2014-12-16 DIAGNOSIS — Z3481 Encounter for supervision of other normal pregnancy, first trimester: Secondary | ICD-10-CM | POA: Diagnosis not present

## 2014-12-16 DIAGNOSIS — O99211 Obesity complicating pregnancy, first trimester: Secondary | ICD-10-CM

## 2014-12-16 LAB — POCT URINALYSIS DIPSTICK
BILIRUBIN UA: NEGATIVE
Glucose, UA: NEGATIVE
Ketones, UA: NEGATIVE
Leukocytes, UA: NEGATIVE
Nitrite, UA: NEGATIVE
PH UA: 5.5
PROTEIN UA: NEGATIVE
RBC UA: NEGATIVE
Spec Grav, UA: 1.02
Urobilinogen, UA: NEGATIVE

## 2014-12-16 MED ORDER — DOXYLAMINE-PYRIDOXINE 10-10 MG PO TBEC: DELAYED_RELEASE_TABLET | ORAL | Status: DC

## 2014-12-16 NOTE — Progress Notes (Signed)
Diclegis samples given at today's visit.  Prior authorization for Rx has been sent to insurance. Pt made aware of PA process.

## 2014-12-16 NOTE — Telephone Encounter (Signed)
Patient has MFM orders/referrals in - per Dr. Clearance Coots, patient does not know about them - did not want to sch until after you talked to her

## 2014-12-16 NOTE — Progress Notes (Signed)
Subjective:    [name] is being seen today for her first obstetrical visit.  This is not a planned pregnancy. She is at [redacted]w[redacted]d gestation. Her obstetrical history is significant for advanced maternal age, excessive weight gain and obesity. Relationship with FOB: significant other, living together. Patient does intend to breast feed. Pregnancy history fully reviewed.  The information documented in the HPI was reviewed and verified.  Menstrual History: OB History    Gravida Para Term Preterm AB TAB SAB Ectopic Multiple Living   Patient's last menstrual period was 10/07/2014.    Past Medical History  Diagnosis Date  . Complication of anesthesia     "makes me shake"  . Arthritis     "back; right foot; left ankle" (07/22/2014)  . Chronic lower back pain   . Cellulitis of leg, right 07/2014    Past Surgical History  Procedure Laterality Date  . Cesarean section  1994; 2010  . Dilation and curettage of uterus  ~ 2008     (Not in a hospital admission) Allergies  Allergen Reactions  . Penicillins     hives    Social History  Substance Use Topics  . Smoking status: Never Smoker   . Smokeless tobacco: Never Used  . Alcohol Use: No    Family History  Problem Relation Age of Onset  . Hypertension Mother   . Diabetes Maternal Grandfather      Review of Systems Constitutional: negative for weight loss Gastrointestinal: negative for vomiting Genitourinary:negative for genital lesions and vaginal discharge and dysuria Musculoskeletal:negative for back pain Behavioral/Psych: negative for abusive relationship, depression, illegal drug usage and tobacco use    Objective:    BP 135/82 mmHg  Pulse 83  Temp(Src) 98.9 F (37.2 C)  Wt 342 lb (155.13 kg)  LMP 10/07/2014 General Appearance:    Alert, cooperative, no distress, appears stated age  Head:    Normocephalic, without obvious abnormality, atraumatic  Eyes:    PERRL, conjunctiva/corneas clear,  EOM's intact, fundi    benign, both eyes  Ears:    Normal TM's and external ear canals, both ears  Nose:   Nares normal, septum midline, mucosa normal, no drainage    or sinus tenderness  Throat:   Lips, mucosa, and tongue normal; teeth and gums normal  Neck:   Supple, symmetrical, trachea midline, no adenopathy;    thyroid:  no enlargement/tenderness/nodules; no carotid   bruit or JVD  Back:     Symmetric, no curvature, ROM normal, no CVA tenderness  Lungs:     Clear to auscultation bilaterally, respirations unlabored  Chest Wall:    No tenderness or deformity   Heart:    Regular rate and rhythm, S1 and S2 normal, no murmur, rub   or gallop  Breast Exam:    No tenderness, masses, or nipple abnormality  Abdomen:     Soft, non-tender, bowel sounds active all four quadrants,    no masses, no organomegaly  Genitalia:    Normal female without lesion, discharge or tenderness  Extremities:   Extremities normal, atraumatic, no cyanosis or edema  Pulses:   2+ and symmetric all extremities  Skin:   Skin color, texture, turgor normal, no rashes or lesions  Lymph nodes:   Cervical, supraclavicular, and axillary nodes normal  Neurologic:   CNII-XII intact, normal strength, sensation and reflexes    throughout      Lab  Review Urine pregnancy test Labs reviewed yes Radiologic studies reviewed yes Ultrasound revealed 8 week viable IUP.  EDC 4-26 - 2017  Assessment:    Pregnancy at 8 weeks    Plan:      Prenatal vitamins.  Counseling provided regarding continued use of seat belts, cessation of alcohol consumption, smoking or use of illicit drugs; infection precautions i.e., influenza/TDAP immunizations, toxoplasmosis,CMV, parvovirus, listeria and varicella; workplace safety, exercise during pregnancy; routine dental care, safe medications, sexual activity, hot tubs, saunas, pools, travel, caffeine use, fish and methlymercury, potential toxins, hair treatments, varicose veins Weight gain  recommendations per IOM guidelines reviewed: underweight/BMI< 18.5--> gain 28 - 40 lbs; normal weight/BMI 18.5 - 24.9--> gain 25 - 35 lbs; overweight/BMI 25 - 29.9--> gain 15 - 25 lbs; obese/BMI >30->gain  11 - 20 lbs Problem list reviewed and updated. FIRST/CF mutation testing/NIPT/QUAD SCREEN/fragile X/Ashkenazi Jewish population testing/Spinal muscular atrophy discussed: requested. Role of ultrasound in pregnancy discussed; fetal survey: requested. Amniocentesis discussed: undecided. VBAC calculator score: VBAC consent form provided Meds ordered this encounter  Medications  . prenatal vitamin w/FE, FA (PRENATAL 1 + 1) 27-1 MG TABS tablet    Sig: Take 1 tablet by mouth daily at 12 noon.  . Doxylamine-Pyridoxine (DICLEGIS) 10-10 MG TBEC    Sig: 1 tab in AM, 1 tab mid afternoon 2 tabs at bedtime. Max dose 4 tabs daily.    Dispense:  100 tablet    Refill:  5   Orders Placed This Encounter  Procedures  . Culture, OB Urine  . SureSwab, Vaginosis/Vaginitis Plus  . Obstetric panel  . HIV antibody  . Hemoglobinopathy evaluation  . Varicella zoster antibody, IgG  . Vit D  25 hydroxy (rtn osteoporosis monitoring)  . TSH  . POCT urinalysis dipstick    Follow up in 4 weeks.

## 2014-12-17 ENCOUNTER — Encounter: Payer: Self-pay | Admitting: Obstetrics

## 2014-12-17 LAB — OBSTETRIC PANEL
Antibody Screen: NEGATIVE
BASOS PCT: 0 % (ref 0–1)
Basophils Absolute: 0 10*3/uL (ref 0.0–0.1)
Eosinophils Absolute: 0.2 10*3/uL (ref 0.0–0.7)
Eosinophils Relative: 2 % (ref 0–5)
HEMATOCRIT: 40.2 % (ref 36.0–46.0)
HEP B S AG: NEGATIVE
Hemoglobin: 13.6 g/dL (ref 12.0–15.0)
LYMPHS ABS: 1.4 10*3/uL (ref 0.7–4.0)
Lymphocytes Relative: 16 % (ref 12–46)
MCH: 27.6 pg (ref 26.0–34.0)
MCHC: 33.8 g/dL (ref 30.0–36.0)
MCV: 81.7 fL (ref 78.0–100.0)
MONO ABS: 0.4 10*3/uL (ref 0.1–1.0)
MONOS PCT: 5 % (ref 3–12)
MPV: 11.9 fL (ref 8.6–12.4)
NEUTROS ABS: 6.5 10*3/uL (ref 1.7–7.7)
Neutrophils Relative %: 77 % (ref 43–77)
PLATELETS: 193 10*3/uL (ref 150–400)
RBC: 4.92 MIL/uL (ref 3.87–5.11)
RDW: 14.5 % (ref 11.5–15.5)
RH TYPE: POSITIVE
RUBELLA: 2.14 {index} — AB (ref <0.90)
WBC: 8.5 10*3/uL (ref 4.0–10.5)

## 2014-12-17 LAB — CULTURE, OB URINE: Colony Count: 5000

## 2014-12-17 LAB — VARICELLA ZOSTER ANTIBODY, IGG: Varicella IgG: 916.5 Index — ABNORMAL HIGH (ref <135.00)

## 2014-12-17 LAB — VITAMIN D 25 HYDROXY (VIT D DEFICIENCY, FRACTURES): Vit D, 25-Hydroxy: 21 ng/mL — ABNORMAL LOW (ref 30–100)

## 2014-12-17 LAB — TSH: TSH: 1.548 u[IU]/mL (ref 0.350–4.500)

## 2014-12-17 LAB — HIV ANTIBODY (ROUTINE TESTING W REFLEX): HIV: NONREACTIVE

## 2014-12-17 NOTE — Telephone Encounter (Signed)
Patient notified

## 2014-12-20 ENCOUNTER — Other Ambulatory Visit: Payer: Self-pay | Admitting: Obstetrics

## 2014-12-20 ENCOUNTER — Telehealth: Payer: Self-pay | Admitting: *Deleted

## 2014-12-20 DIAGNOSIS — O219 Vomiting of pregnancy, unspecified: Secondary | ICD-10-CM

## 2014-12-20 LAB — HEMOGLOBINOPATHY EVALUATION
HGB A2 QUANT: 2.5 % (ref 2.2–3.2)
HGB A: 97.5 % (ref 96.8–97.8)
Hemoglobin Other: 0 %
Hgb F Quant: 0 % (ref 0.0–2.0)
Hgb S Quant: 0 %

## 2014-12-20 MED ORDER — METOCLOPRAMIDE HCL 10 MG PO TABS: 10.0000 mg | ORAL_TABLET | Freq: Three times a day (TID) | ORAL | Status: DC

## 2014-12-20 NOTE — Telephone Encounter (Signed)
Patient reports she was in the office and given samples for nausea and vomiting. The medication is not working and Dr Clearance Coots told patient to call back if it did not work. 12:22 Spoke with patient- she states she is taking top dosing on the Dicleges and not getting any relief. She is not able to keep anything down and would like something different. Told patient I would check with Dr Clearance Coots.

## 2014-12-20 NOTE — Telephone Encounter (Signed)
Patient notified- Rx sent to pharmacy. 

## 2014-12-20 NOTE — Telephone Encounter (Signed)
Reglan Rx 

## 2014-12-21 ENCOUNTER — Other Ambulatory Visit: Payer: Self-pay | Admitting: Obstetrics

## 2014-12-21 DIAGNOSIS — N760 Acute vaginitis: Principal | ICD-10-CM

## 2014-12-21 DIAGNOSIS — B9689 Other specified bacterial agents as the cause of diseases classified elsewhere: Secondary | ICD-10-CM

## 2014-12-21 LAB — PAP, TP IMAGING W/ HPV RNA, RFLX HPV TYPE 16,18/45: HPV mRNA, High Risk: NOT DETECTED

## 2014-12-21 LAB — SURESWAB, VAGINOSIS/VAGINITIS PLUS
ATOPOBIUM VAGINAE: 7.4 Log (cells/mL)
C. albicans, DNA: NOT DETECTED
C. glabrata, DNA: NOT DETECTED
C. parapsilosis, DNA: NOT DETECTED
C. trachomatis RNA, TMA: NOT DETECTED
C. tropicalis, DNA: NOT DETECTED
Gardnerella vaginalis: >8 Log (cells/mL)
LACTOBACILLUS SPECIES: NOT DETECTED Log (cells/mL)
MEGASPHAERA SPECIES: >8 Log (cells/mL)
N. GONORRHOEAE RNA, TMA: NOT DETECTED
T. vaginalis RNA, QL TMA: NOT DETECTED

## 2014-12-21 MED ORDER — CLINDAMYCIN HCL 300 MG PO CAPS: 300.0000 mg | ORAL_CAPSULE | Freq: Three times a day (TID) | ORAL | Status: DC

## 2014-12-22 ENCOUNTER — Other Ambulatory Visit: Payer: Medicaid Other

## 2014-12-22 ENCOUNTER — Other Ambulatory Visit: Payer: Self-pay | Admitting: *Deleted

## 2014-12-22 DIAGNOSIS — B9689 Other specified bacterial agents as the cause of diseases classified elsewhere: Secondary | ICD-10-CM

## 2014-12-22 DIAGNOSIS — N760 Acute vaginitis: Principal | ICD-10-CM

## 2014-12-22 MED ORDER — METRONIDAZOLE 0.75 % VA GEL: 1.0000 | Freq: Every day | VAGINAL | Status: DC

## 2014-12-22 NOTE — Progress Notes (Signed)
Pt request MetroGel as she is still having N&V in pregnancy.

## 2015-01-13 ENCOUNTER — Ambulatory Visit (INDEPENDENT_AMBULATORY_CARE_PROVIDER_SITE_OTHER): Payer: Medicaid Other | Admitting: Obstetrics

## 2015-01-13 ENCOUNTER — Encounter: Payer: Self-pay | Admitting: Obstetrics

## 2015-01-13 VITALS — BP 126/77 | HR 81 | Wt 342.0 lb

## 2015-01-13 DIAGNOSIS — Z3482 Encounter for supervision of other normal pregnancy, second trimester: Secondary | ICD-10-CM

## 2015-01-13 LAB — POCT URINALYSIS DIPSTICK
Bilirubin, UA: NEGATIVE
Blood, UA: NEGATIVE
GLUCOSE UA: NEGATIVE
Ketones, UA: NEGATIVE
Leukocytes, UA: NEGATIVE
Nitrite, UA: NEGATIVE
Protein, UA: NEGATIVE
Spec Grav, UA: 1.02
UROBILINOGEN UA: NEGATIVE
pH, UA: 5

## 2015-01-13 NOTE — Progress Notes (Signed)
  Subjective:    [name] is a 34 y.o. female being seen today for her obstetrical visit. She is at 10916w1d gestation. Patient reports: no complaints.  Problem List Items Addressed This Visit    None    Visit Diagnoses    Encounter for supervision of other normal pregnancy in second trimester    -  Primary    Relevant Orders    POCT urinalysis dipstick (Completed)      Patient Active Problem List   Diagnosis Date Noted  . Anemia 07/24/2014  . Morbid obesity (HCC) 07/22/2014  . Cellulitis of right lower extremity 07/22/2014  . Prediabetes 07/22/2014  . Fissure in skin of foot 07/22/2014  . GERD without esophagitis 08/28/2012    Objective:     BP 126/77 mmHg  Pulse 81  Wt 342 lb (155.13 kg)  LMP 10/07/2014 Uterine Size: Below umbilicus     Assessment:    Pregnancy @ 2316w1d  weeks Doing well    Plan:    Problem list reviewed and updated. Labs reviewed.  Follow up in 4 weeks. FIRST/CF mutation testing/NIPT/QUAD SCREEN/fragile X/Ashkenazi Jewish population testing/Spinal muscular atrophy discussed: requested. Role of ultrasound in pregnancy discussed; fetal survey: requested. Amniocentesis discussed: undecided

## 2015-01-17 ENCOUNTER — Other Ambulatory Visit: Payer: Self-pay | Admitting: Obstetrics

## 2015-01-17 ENCOUNTER — Ambulatory Visit (HOSPITAL_COMMUNITY)
Admission: RE | Admit: 2015-01-17 | Discharge: 2015-01-17 | Disposition: A | Discharge status: Home / Self Care | Payer: Medicaid Other | Source: Ambulatory Visit | Attending: Obstetrics | Admitting: Obstetrics

## 2015-01-17 ENCOUNTER — Ambulatory Visit (HOSPITAL_COMMUNITY): Admission: RE | Admit: 2015-01-17 | Payer: Medicaid Other | Source: Ambulatory Visit

## 2015-01-17 VITALS — BP 126/64 | HR 75 | Wt 343.4 lb

## 2015-01-17 DIAGNOSIS — O09521 Supervision of elderly multigravida, first trimester: Secondary | ICD-10-CM

## 2015-01-17 DIAGNOSIS — Z3A12 12 weeks gestation of pregnancy: Secondary | ICD-10-CM | POA: Diagnosis not present

## 2015-01-17 DIAGNOSIS — Z3682 Encounter for antenatal screening for nuchal translucency: Secondary | ICD-10-CM

## 2015-01-17 DIAGNOSIS — O09522 Supervision of elderly multigravida, second trimester: Secondary | ICD-10-CM

## 2015-01-17 DIAGNOSIS — Z3689 Encounter for other specified antenatal screening: Secondary | ICD-10-CM

## 2015-01-17 DIAGNOSIS — O09529 Supervision of elderly multigravida, unspecified trimester: Secondary | ICD-10-CM | POA: Insufficient documentation

## 2015-01-17 DIAGNOSIS — O99211 Obesity complicating pregnancy, first trimester: Secondary | ICD-10-CM

## 2015-01-17 DIAGNOSIS — Z315 Encounter for genetic counseling: Secondary | ICD-10-CM | POA: Diagnosis present

## 2015-01-17 HISTORY — DX: Supervision of elderly multigravida, unspecified trimester: O09.529

## 2015-01-17 NOTE — Progress Notes (Signed)
Genetic Counseling  High-Risk Gestation Note  Appointment Date:  01/17/2015 Referred By: Brock BadHarper, Charles A, MD Date of Birth:  [birth year] Partner: Fayrene HelperKevin Pearson   Pregnancy History: [id number] Estimated Date of Delivery: 07/27/15 Estimated Gestational Age: 8451w5d Attending: Damaris HippoJeffrey Denney, MD   Ms. [name] Aguiniga and her partner, Mr. Fayrene HelperKevin Pearson, were seen for genetic counseling because of a maternal age of 34 y.o..  She will be 34 years old at delivery.   In Summary:  Reviewed approximate 1 in 6687 risk for fetal aneuploidy related to maternal age of 34 years old at delivery  Nuchal translucency ultrasound performed today and within normal limits  Couple declined additional screening and testing for fetal chromosome conditions including First screen, Quad screen, NIPS, CVS and amniocentesis  Detailed ultrasound scheduled for 02/28/15  Family history significant for distant relative with intellectual disability; Patient declined Fragile X carrier screening  They were counseled regarding maternal age and the association with risk for chromosome conditions due to nondisjunction with aging of the ova.   We reviewed chromosomes, nondisjunction, and the associated 1 in 5087 risk for fetal aneuploidy at 5551w5d gestation related to a maternal age of 34 years old at delivery.  They were counseled that the risk for aneuploidy decreases as gestational age increases, accounting for those pregnancies which spontaneously abort.  We specifically discussed Down syndrome (trisomy 5621), trisomies 6013 and 118, and sex chromosome aneuploidies (47,XXX and 47,XXY) including the common features and prognoses of each.   We reviewed available screening options including First Screen, Quad screen, noninvasive prenatal screening (NIPS)/prenatal cell free DNA (cfDNA) testing, and detailed ultrasound.  They were counseled that screening tests are used to modify a patient's a priori risk for aneuploidy, typically based on age.  This estimate provides a pregnancy specific risk assessment. We reviewed the benefits and limitations of each option. Specifically, we discussed the conditions for which each test screens, the detection rates, and false positive rates of each. They were also counseled regarding diagnostic testing via CVS and amniocentesis. We reviewed the approximate 1 in 300-500 risk for complications for amniocentesis, including spontaneous pregnancy loss.   Nuchal translucency ultrasound was performed today. NT measurement was within normal range, and nasal bone was visualized today. Complete ultrasound results reported separately. After consideration of all the options, they declined fetal aneuploidy screening or testing other than ultrasound. This includes declining First screen, Quad screen, NIPS, CVS, and amniocentesis. Detailed ultrasound is scheduled for 02/28/15. They understand that screening tests cannot rule out all birth defects or genetic syndromes. The patient was advised of this limitation and states she still does not want additional testing at this time.    Ms. [name] Suder was provided with written information regarding sickle cell anemia (SCA) including the carrier frequency and incidence in the African-American population, the availability of carrier testing and prenatal diagnosis if indicated.  In addition, we discussed that hemoglobinopathies are routinely screened for as part of the [place] newborn screening panel.  Hemoglobin electrophoresis was previously performed through her OB office and was within normal range.  Both family histories were reviewed and found to be contributory for a maternal great-aunt to the patient with intellectual disability. She reported that this individual died in her 8430's and lived with the patient's grandmother. She did not have additional physical medical issues, and an underlying cause was not known for her delays. Ms. Katrinka BlazingSmith was counseled that there are many different  causes of intellectual disabilities including environmental, multifactorial, and genetic etiologies.  We discussed that a specific diagnosis for intellectual disability can be determined in approximately 50% of these individuals.  In the remaining 50% of individuals, a diagnosis may never be determined.  Regarding genetic causes, we discussed that chromosome aberrations (aneuploidy, deletions, duplications, insertions, and translocations) are responsible for a small percentage of individuals with intellectual disability.  Many individuals with chromosome aberrations have additional differences, including congenital anomalies or minor dysmorphisms.  Likewise, single gene conditions are the underlying cause of intellectual delay in some families.  We discussed that many gene conditions have intellectual disability as a feature, but also often include other physical or medical differences.  Specifically, we reviewed fragile X syndrome and the X-linked inheritance of this condition.  We discussed the option of FMR1 (the gene that when altered causes fragile X syndrome) analysis to determine whether Fragile X syndrome is the cause of intellectual disability in this family.  Ms. Olejniczak declined FMR1 analysis today. We discussed that without more specific information, it is difficult to provide an accurate risk assessment.  Further genetic counseling is warranted if more information is obtained.  Ms. TONI DEMO denied exposure to environmental toxins or chemical agents. She denied the use of alcohol, tobacco or street drugs. She denied significant viral illnesses during the course of her pregnancy. Her medical and surgical histories were noncontributory.   I counseled this couple regarding the above risks and available options.  The approximate face-to-face time with the genetic counselor was 40 minutes.  Quinn Plowman, MS,  Certified Genetic Counselor 01/17/2015

## 2015-01-24 ENCOUNTER — Other Ambulatory Visit: Payer: Self-pay | Admitting: *Deleted

## 2015-01-24 DIAGNOSIS — Z3A12 12 weeks gestation of pregnancy: Secondary | ICD-10-CM

## 2015-01-24 MED ORDER — PRENATE 0.6-0.4 MG PO CHEW: 1.0000 | CHEWABLE_TABLET | Freq: Every day | ORAL | Status: DC

## 2015-01-29 ENCOUNTER — Encounter (HOSPITAL_COMMUNITY): Payer: Self-pay | Admitting: *Deleted

## 2015-01-29 ENCOUNTER — Inpatient Hospital Stay (HOSPITAL_COMMUNITY)
Admission: AD | Admit: 2015-01-29 | Discharge: 2015-01-29 | Disposition: A | Discharge status: Home / Self Care | Payer: Medicaid Other | Source: Ambulatory Visit | Attending: Obstetrics | Admitting: Obstetrics

## 2015-01-29 DIAGNOSIS — R102 Pelvic and perineal pain: Secondary | ICD-10-CM | POA: Insufficient documentation

## 2015-01-29 DIAGNOSIS — O26892 Other specified pregnancy related conditions, second trimester: Secondary | ICD-10-CM | POA: Insufficient documentation

## 2015-01-29 DIAGNOSIS — Z3A14 14 weeks gestation of pregnancy: Secondary | ICD-10-CM | POA: Insufficient documentation

## 2015-01-29 DIAGNOSIS — N949 Unspecified condition associated with female genital organs and menstrual cycle: Secondary | ICD-10-CM

## 2015-01-29 LAB — URINALYSIS, ROUTINE W REFLEX MICROSCOPIC
Bilirubin Urine: NEGATIVE
Glucose, UA: NEGATIVE mg/dL
Hgb urine dipstick: NEGATIVE
KETONES UR: NEGATIVE mg/dL
LEUKOCYTES UA: NEGATIVE
Nitrite: NEGATIVE
PROTEIN: NEGATIVE mg/dL
Specific Gravity, Urine: 1.015 (ref 1.005–1.030)
UROBILINOGEN UA: 0.2 mg/dL (ref 0.0–1.0)
pH: 7 (ref 5.0–8.0)

## 2015-01-29 LAB — WET PREP, GENITAL
CLUE CELLS WET PREP: NONE SEEN
Trich, Wet Prep: NONE SEEN
YEAST WET PREP: NONE SEEN

## 2015-01-29 NOTE — MAU Provider Note (Signed)
Chief Complaint: Abdominal Pain and Pelvic Pain   First Provider Initiated Contact with Patient 01/29/15 1312     SUBJECTIVE HPI: [name] is a 34 y.o. [id number] at 543w3d who presents to Maternity Admissions reporting pelvic pressure x several weeks. Discussed at last prenatal visit and was told that is was normal discomfort of pregnancy, but was not tested for anything.   Heart first trimester screen performed 01/17/2015. In normal except for 3 cm fibroid and corpus luteum cyst.  Location: pelvis Quality: pressure Severity: 0/10 on pain scale Duration: several weeks Context: pressure w/ walking, more bony pelvic pain w/ lying down  Timing: constant Modifying factors: activity Associated signs and symptoms: Positive for frequency of urination throughout the pregnancy. No recent change. Negative for fever, chills, nausea, vomiting, diarrhea, constipation, dysuria, hematuria, urgency, vaginal bleeding or vaginal discharge.  Past Medical History  Diagnosis Date  . Complication of anesthesia     "makes me shake"  . Arthritis     "back; right foot; left ankle" (07/22/2014)  . Chronic lower back pain   . Cellulitis of leg, right 07/2014   OB History  Gravida Para Term Preterm AB SAB TAB Ectopic Multiple Living  5 0 0  1 1    3     # Outcome Date GA Lbr Len/2nd Weight Sex Delivery Anes PTL Lv  5 Current           4 Gravida 12/24/08    M CS-LTranv   Y  3 SAB 04/02/06          2 Gravida 02/11/97    F Vag-Spont   Y  1 Gravida 12/29/92    Wandalee FerdinandM CS-LTranv   Y     Past Surgical History  Procedure Laterality Date  . Cesarean section  1994; 2010  . Dilation and curettage of uterus  ~ 2008   Social History   Social History  . Marital Status: Single    Spouse Name: N/A  . Number of Children: N/A  . Years of Education: N/A   Occupational History  . Not on file.   Social History Main Topics  . Smoking status: Never Smoker   . Smokeless tobacco: Never Used  . Alcohol Use: No  .  Drug Use: No  . Sexual Activity: No   Other Topics Concern  . Not on file   Social History Narrative   No current facility-administered medications on file prior to encounter.   Current Outpatient Prescriptions on File Prior to Encounter  Medication Sig Dispense Refill  . Prenat MV-Min-Methylfolate-FA (PRENATE) 0.6-0.4 MG CHEW Chew 1 tablet by mouth daily. 30 tablet 11  . Doxylamine-Pyridoxine (DICLEGIS) 10-10 MG TBEC 1 tab in AM, 1 tab mid afternoon 2 tabs at bedtime. Max dose 4 tabs daily. (Patient not taking: Reported on 01/17/2015) 100 tablet 5  . hydrocerin (EUCERIN) CREA Apply 1 application topically 2 (two) times daily as needed.  0  . lactulose (CHRONULAC) 10 GM/15ML solution Take 15 mLs (10 g total) by mouth 3 (three) times daily. (Patient not taking: Reported on 12/16/2014) 946 mL 1  . metoCLOPramide (REGLAN) 10 MG tablet Take 1 tablet (10 mg total) by mouth 3 (three) times daily before meals. (Patient not taking: Reported on 01/17/2015) 90 tablet 2  . permethrin (ELIMITE) 5 % cream Apply to affected area , then rinse off in 8 hours (Patient not taking: Reported on 12/16/2014) 60 g 0  . triamcinolone cream (KENALOG) 0.1 % Apply 1 application topically 2 (  two) times daily. 30 g 0   Allergies  Allergen Reactions  . Penicillins     hives    I have reviewed the past Medical Hx, Surgical Hx, Social Hx, Allergies and Medications.   Review of Systems  Constitutional: Negative for fever and chills.  Gastrointestinal: Negative for nausea, vomiting, abdominal pain, diarrhea, constipation and rectal pain.  Genitourinary: Positive for frequency and pelvic pain. Negative for dysuria, urgency, hematuria, flank pain, vaginal bleeding, vaginal discharge and vaginal pain.  Musculoskeletal: Negative for back pain.    OBJECTIVE Patient Vitals for the past 24 hrs:  BP Temp Temp src Pulse Resp  01/29/15 1215 118/71 mmHg 98 F (36.7 C) Oral 92 18   Constitutional: Well-developed,  well-nourished morbidly obese female in no acute distress.  Cardiovascular: normal rate Respiratory: normal rate and effort.  GI: Abd soft, non-tender. UTA fundal height due to maternal body habitus. MS: Extremities nontender, no edema, normal ROM Neurologic: Alert and oriented x 4.  GU: Neg CVAT.  SPECULUM EXAM: NEFG, physiologic discharge, no blood noted, cervix clean  BIMANUAL: cervix closed and long; uterus 14-week size, no adnexal tenderness or masses. No CMT. FHR 145 by doppler  LAB RESULTS Results for orders placed or performed during the hospital encounter of 01/29/15 (from the past 24 hour(s))  Urinalysis, Routine w reflex microscopic (not at [facility])     Status: None   Collection Time: 01/29/15 12:20 PM  Result Value Ref Range   Color, Urine YELLOW YELLOW   APPearance CLEAR CLEAR   Specific Gravity, Urine 1.015 1.005 - 1.030   pH 7.0 5.0 - 8.0   Glucose, UA NEGATIVE NEGATIVE mg/dL   Hgb urine dipstick NEGATIVE NEGATIVE   Bilirubin Urine NEGATIVE NEGATIVE   Ketones, ur NEGATIVE NEGATIVE mg/dL   Protein, ur NEGATIVE NEGATIVE mg/dL   Urobilinogen, UA 0.2 0.0 - 1.0 mg/dL   Nitrite NEGATIVE NEGATIVE   Leukocytes, UA NEGATIVE NEGATIVE  Wet prep, genital     Status: Abnormal   Collection Time: 01/29/15  1:19 PM  Result Value Ref Range   Yeast Wet Prep HPF POC NONE SEEN NONE SEEN   Trich, Wet Prep NONE SEEN NONE SEEN   Clue Cells Wet Prep HPF POC NONE SEEN NONE SEEN   WBC, Wet Prep HPF POC MODERATE (A) NONE SEEN    IMAGING NA  MAU COURSE UA, wet prep, GC/chlamydia cultures, wet compress.  Discussed patient's complaints, exam and lab results with Dr. Gaynell Face. No further testing needed.  MDM 34 year old G5 P3 with pelvic pressure times several weeks but is not progressing. No evidence of infection or obstetric complication or emergency today. Pressure is likely normal discomfort of pregnancy exacerbated by her high parity and morbid obesity.  ASSESSMENT 1. Pelvic  pressure in pregnancy, antepartum, second trimester     PLAN Discharge home in stable condition. Second trimester Precautions Increase fluids and rest. Recommend maternity support belt, warm baths, Tylenol when necessary.     Follow-up Information    Follow up with HARPER,CHARLES A, MD On 02/10/2015.   Specialty:  Obstetrics and Gynecology   Why:  Routine prenatal visit or sooner as needed if symptoms worsen.    Contact information:   [address] [place] [address] [phone]       Follow up with [facility] [place] [facility]ERN[facility] ADMISS[facility].   Why:  As needed in emergencies   Contact information:   [address] [id number] mc [place] [address] [phone]  Medication List    STOP taking these medications        Doxylamine-Pyridoxine 10-10 MG Tbec  Commonly known as:  DICLEGIS     lactulose 10 GM/15ML solution  Commonly known as:  CHRONULAC     metoCLOPramide 10 MG tablet  Commonly known as:  REGLAN     permethrin 5 % cream  Commonly known as:  ELIMITE      TAKE these medications        hydrocerin Crea  Apply 1 application topically 2 (two) times daily as needed.     PRENATE 0.6-0.4 MG Chew  Chew 1 tablet by mouth daily.     triamcinolone cream 0.1 %  Commonly known as:  KENALOG  Apply 1 application topically 2 (two) times daily.         [place], CNM 01/29/2015  2:36 PM

## 2015-01-29 NOTE — Discharge Instructions (Signed)

## 2015-01-29 NOTE — MAU Note (Signed)
Pt C/O abd cramping & pelvic pain, pressure for several days.  Denies vag bleeding or discharge.

## 2015-01-31 LAB — GC/CHLAMYDIA PROBE AMP (~~LOC~~) NOT AT ARMC
Chlamydia: NEGATIVE
NEISSERIA GONORRHEA: NEGATIVE

## 2015-02-06 ENCOUNTER — Inpatient Hospital Stay (HOSPITAL_COMMUNITY)
Admission: AD | Admit: 2015-02-06 | Discharge: 2015-02-06 | Disposition: A | Discharge status: Home / Self Care | Payer: Medicaid Other | Source: Ambulatory Visit | Attending: Obstetrics | Admitting: Obstetrics

## 2015-02-06 ENCOUNTER — Encounter (HOSPITAL_COMMUNITY): Payer: Self-pay | Admitting: *Deleted

## 2015-02-06 DIAGNOSIS — R109 Unspecified abdominal pain: Secondary | ICD-10-CM | POA: Insufficient documentation

## 2015-02-06 DIAGNOSIS — Z880 Allergy status to penicillin: Secondary | ICD-10-CM | POA: Diagnosis not present

## 2015-02-06 DIAGNOSIS — K219 Gastro-esophageal reflux disease without esophagitis: Secondary | ICD-10-CM | POA: Insufficient documentation

## 2015-02-06 DIAGNOSIS — Z3A15 15 weeks gestation of pregnancy: Secondary | ICD-10-CM | POA: Insufficient documentation

## 2015-02-06 DIAGNOSIS — K5900 Constipation, unspecified: Secondary | ICD-10-CM | POA: Insufficient documentation

## 2015-02-06 DIAGNOSIS — O99612 Diseases of the digestive system complicating pregnancy, second trimester: Secondary | ICD-10-CM

## 2015-02-06 LAB — CBC
HEMATOCRIT: 38.2 % (ref 36.0–46.0)
Hemoglobin: 12.6 g/dL (ref 12.0–15.0)
MCH: 27.3 pg (ref 26.0–34.0)
MCHC: 33 g/dL (ref 30.0–36.0)
MCV: 82.9 fL (ref 78.0–100.0)
PLATELETS: 184 10*3/uL (ref 150–400)
RBC: 4.61 MIL/uL (ref 3.87–5.11)
RDW: 13.5 % (ref 11.5–15.5)
WBC: 8.9 10*3/uL (ref 4.0–10.5)

## 2015-02-06 LAB — COMPREHENSIVE METABOLIC PANEL
ALBUMIN: 3.4 g/dL — AB (ref 3.5–5.0)
ALT: 19 U/L (ref 14–54)
AST: 22 U/L (ref 15–41)
Alkaline Phosphatase: 35 U/L — ABNORMAL LOW (ref 38–126)
Anion gap: 8 (ref 5–15)
BILIRUBIN TOTAL: 0.2 mg/dL — AB (ref 0.3–1.2)
BUN: 7 mg/dL (ref 6–20)
CHLORIDE: 104 mmol/L (ref 101–111)
CO2: 24 mmol/L (ref 22–32)
CREATININE: 0.62 mg/dL (ref 0.44–1.00)
Calcium: 9.2 mg/dL (ref 8.9–10.3)
GFR calc Af Amer: >60 mL/min (ref >60)
GFR calc non Af Amer: >60 mL/min (ref >60)
GLUCOSE: 103 mg/dL — AB (ref 65–99)
POTASSIUM: 3.6 mmol/L (ref 3.5–5.1)
Sodium: 136 mmol/L (ref 135–145)
Total Protein: 7.1 g/dL (ref 6.5–8.1)

## 2015-02-06 LAB — LIPASE, BLOOD: Lipase: 25 U/L (ref 11–51)

## 2015-02-06 LAB — URINALYSIS, ROUTINE W REFLEX MICROSCOPIC
Bilirubin Urine: NEGATIVE
Glucose, UA: NEGATIVE mg/dL
HGB URINE DIPSTICK: NEGATIVE
Ketones, ur: NEGATIVE mg/dL
Leukocytes, UA: NEGATIVE
NITRITE: NEGATIVE
PROTEIN: NEGATIVE mg/dL
UROBILINOGEN UA: 1 mg/dL (ref 0.0–1.0)
pH: 6 (ref 5.0–8.0)

## 2015-02-06 LAB — AMYLASE: Amylase: 96 U/L (ref 28–100)

## 2015-02-06 MED ORDER — POLYETHYLENE GLYCOL 3350 17 G PO PACK: 17.0000 g | PACK | Freq: Every day | ORAL | Status: DC | PRN

## 2015-02-06 MED ORDER — FAMOTIDINE 20 MG PO TABS: 20.0000 mg | ORAL_TABLET | Freq: Two times a day (BID) | ORAL | Status: DC

## 2015-02-06 MED ORDER — GI COCKTAIL ~~LOC~~
30.0000 mL | Freq: Once | ORAL | Status: AC
  Administered 2015-02-06: 30 mL via ORAL
  Filled 2015-02-06: qty 30

## 2015-02-06 NOTE — MAU Note (Addendum)
C/o sharp crampy abdominal pain in the upper and mid abdomen that started about a hour ago; denies any vaginal bleeding or discharge; denies any injury, heavy lifting and eating something different; FHR dopplered  @ 150;

## 2015-02-06 NOTE — MAU Provider Note (Signed)
Chief Complaint: Abdominal Pain  First Provider Initiated Contact with Patient 02/06/15 1832      SUBJECTIVE HPI: Christina FlurryLalita N Strickland is a 34 y.o. [id number] at 830w4d who presents to Maternity Admissions reporting crampy, sharp abdominal pain since 5 PM. Pain started around the time patient 8 small amount of chicken macaroni. No history of similar symptoms or gallbladder problems.   Location: Bilateral upper abdomen Quality: Crampy and sharp Severity: 8/10 on pain scale Duration: Less than 2 hours Context: With eating Timing: Intermittent Modifying factors: Hasn't tried anything for the pain. Has not resolved spontaneously. Associated signs and symptoms: Negative for fever, chills, vomiting, diarrhea. Positive for mild nausea (resolved) and constipation. Last bowel movement 2 days ago.  Past Medical History  Diagnosis Date  . Complication of anesthesia     "makes me shake"  . Arthritis     "back; right foot; left ankle" (07/22/2014)  . Chronic lower back pain   . Cellulitis of leg, right 07/2014   OB History  Gravida Para Term Preterm AB SAB TAB Ectopic Multiple Living  5 0 0  1 1    3     # Outcome Date GA Lbr Len/2nd Weight Sex Delivery Anes PTL Lv  5 Current           4 Gravida 12/24/08    M CS-LTranv   Y  3 SAB 04/02/06          2 Gravida 02/11/97    F Vag-Spont   Y  1 Gravida 12/29/92    Wandalee FerdinandM CS-LTranv   Y     Past Surgical History  Procedure Laterality Date  . Cesarean section  1994; 2010  . Dilation and curettage of uterus  ~ 2008   Social History   Social History  . Marital Status: Single    Spouse Name: N/A  . Number of Children: N/A  . Years of Education: N/A   Occupational History  . Not on file.   Social History Main Topics  . Smoking status: Never Smoker   . Smokeless tobacco: Never Used  . Alcohol Use: No  . Drug Use: No  . Sexual Activity: No   Other Topics Concern  . Not on file   Social History Narrative   No current facility-administered  medications on file prior to encounter.   Current Outpatient Prescriptions on File Prior to Encounter  Medication Sig Dispense Refill  . Prenat MV-Min-Methylfolate-FA (PRENATE) 0.6-0.4 MG CHEW Chew 1 tablet by mouth daily. 30 tablet 11  . triamcinolone cream (KENALOG) 0.1 % Apply 1 application topically 2 (two) times daily. (Patient not taking: Reported on 02/06/2015) 30 g 0   Allergies  Allergen Reactions  . Penicillins Hives and Other (See Comments)    Has patient had a PCN reaction causing immediate rash, facial/tongue/throat swelling, SOB or lightheadedness with hypotension: No Has patient had a PCN reaction causing severe rash involving mucus membranes or skin necrosis: No Has patient had a PCN reaction that required hospitalization No Has patient had a PCN reaction occurring within the last 10 years: No If all of the above answers are "NO", then may proceed with Cephalosporin use.    I have reviewed the past Medical Hx, Surgical Hx, Social Hx, Allergies and Medications.   Review of Systems  OBJECTIVE Patient Vitals for the past 24 hrs:  BP Temp Temp src Pulse Resp Weight  02/06/15 1818 - - - - - (!) 155.584 kg (343 lb)  02/06/15 1805 139/79 mmHg 98.6  F (37 C) Oral 79 20 -   Constitutional: Well-developed, well-nourished female in mild distress.  Cardiovascular: normal rate Respiratory: normal rate and effort.  GI: Abd soft, mid diffuse upper abd pain, gravid appropriate for gestational age. Pos BS x 4 Neurologic: Alert and oriented x 4.  GU: Deferred  Fetal heart rate 150 by Doppler.  LAB RESULTS Results for orders placed or performed during the hospital encounter of 02/06/15 (from the past 24 hour(s))  Urinalysis, Routine w reflex microscopic (not at [facility])     Status: Abnormal   Collection Time: 02/06/15  5:55 PM  Result Value Ref Range   Color, Urine YELLOW YELLOW   APPearance CLEAR CLEAR   Specific Gravity, Urine >1.030 (H) 1.005 - 1.030   pH 6.0 5.0 - 8.0    Glucose, UA NEGATIVE NEGATIVE mg/dL   Hgb urine dipstick NEGATIVE NEGATIVE   Bilirubin Urine NEGATIVE NEGATIVE   Ketones, ur NEGATIVE NEGATIVE mg/dL   Protein, ur NEGATIVE NEGATIVE mg/dL   Urobilinogen, UA 1.0 0.0 - 1.0 mg/dL   Nitrite NEGATIVE NEGATIVE   Leukocytes, UA NEGATIVE NEGATIVE  CBC     Status: None   Collection Time: 02/06/15  6:40 PM  Result Value Ref Range   WBC 8.9 4.0 - 10.5 K/uL   RBC 4.61 3.87 - 5.11 MIL/uL   Hemoglobin 12.6 12.0 - 15.0 g/dL   HCT 16.1 09.6 - 04.5 %   MCV 82.9 78.0 - 100.0 fL   MCH 27.3 26.0 - 34.0 pg   MCHC 33.0 30.0 - 36.0 g/dL   RDW 40.9 81.1 - 91.4 %   Platelets 184 150 - 400 K/uL  Comprehensive metabolic panel     Status: Abnormal   Collection Time: 02/06/15  6:40 PM  Result Value Ref Range   Sodium 136 135 - 145 mmol/L   Potassium 3.6 3.5 - 5.1 mmol/L   Chloride 104 101 - 111 mmol/L   CO2 24 22 - 32 mmol/L   Glucose, Bld 103 (H) 65 - 99 mg/dL   BUN 7 6 - 20 mg/dL   Creatinine, Ser 7.82 0.44 - 1.00 mg/dL   Calcium 9.2 8.9 - 95.6 mg/dL   Total Protein 7.1 6.5 - 8.1 g/dL   Albumin 3.4 (L) 3.5 - 5.0 g/dL   AST 22 15 - 41 U/L   ALT 19 14 - 54 U/L   Alkaline Phosphatase 35 (L) 38 - 126 U/L   Total Bilirubin 0.2 (L) 0.3 - 1.2 mg/dL   GFR calc non Af Amer >60 >60 mL/min   GFR calc Af Amer >60 >60 mL/min   Anion gap 8 5 - 15  Lipase, blood     Status: None   Collection Time: 02/06/15  6:40 PM  Result Value Ref Range   Lipase 25 11 - 51 U/L  Amylase     Status: None   Collection Time: 02/06/15  6:40 PM  Result Value Ref Range   Amylase 96 28 - 100 U/L    IMAGING N/A  MAU COURSE CBC, CMP, amylase, lipase, UA, GI cocktail.  Pain resolved.  MDM 34 year old over the obese female at 15 weeks 4 days with 2 hour episode of sharp crampy upper abdominal pain that resolved with GI cocktail. Pain likely caused by a either reflux and or constipation/gas pains. No evidence of any emergent condition. No evidence of pregnancy  complications.  ASSESSMENT 1. Gastroesophageal reflux during pregnancy in second trimester, antepartum   2. Constipation during pregnancy, second trimester  PLAN Discharge home in stable condition. GERD diet. Also recommend patient avoid fatty foods. Increase fluids and fiber and use MiraLAX as needed until soft daily bowel movement. May need outpatient abdominal ultrasound outpatient if symptoms recur.     Follow-up Information    Follow up with [facility] On 02/10/2015.   Why:  for routine prenatal visit or sooner as needed if symptoms worsen   Contact information:   [address] [place] [address] [phone]      Follow up with [facility] [place] MATERNITY ADMISSIONS.   Why:  As needed in emergencies   Contact information:   [address] [id number] mc [place] [address] [phone]       Medication List    STOP taking these medications        triamcinolone cream 0.1 %  Commonly known as:  KENALOG      TAKE these medications        famotidine 20 MG tablet  Commonly known as:  PEPCID  Take 1 tablet (20 mg total) by mouth 2 (two) times daily.     polyethylene glycol packet  Commonly known as:  MIRALAX / GLYCOLAX  Take 17 g by mouth daily as needed for moderate constipation.     PRENATE 0.6-0.4 MG Chew  Chew 1 tablet by mouth daily.         [place], CNM 02/06/2015  7:57 PM

## 2015-02-06 NOTE — Discharge Instructions (Signed)
Heartburn During Pregnancy Heartburn is a burning sensation in the chest caused by stomach acid backing up into the esophagus. Heartburn is common in pregnancy because a certain hormone (progesterone) is released when a woman is pregnant. The progesterone hormone may relax the valve that separates the esophagus from the stomach. This allows acid to go up into the esophagus, causing heartburn. Heartburn may also happen in pregnancy because the enlarging uterus pushes up on the stomach, which pushes more acid into the esophagus. This is especially true in the later stages of pregnancy. Heartburn problems usually go away after giving birth. CAUSES  Heartburn is caused by stomach acid backing up into the esophagus. During pregnancy, this may result from various things, including:   The progesterone hormone.  Changing hormone levels.  The growing uterus pushing stomach acid upward.  Large meals.  Certain foods and drinks.  Exercise.  Increased acid production. SIGNS AND SYMPTOMS   Burning pain in the chest or lower throat.  Bitter taste in the mouth.  Coughing. DIAGNOSIS  Your health care provider will typically diagnose heartburn by taking a careful history of your concern. Blood tests may be done to check for a certain type of bacteria that is associated with heartburn. Sometimes, heartburn is diagnosed by prescribing a heartburn medicine to see if the symptoms improve. In some cases, a procedure called an endoscopy may be done. In this procedure, a tube with a light and a camera on the end (endoscope) is used to examine the esophagus and the stomach. TREATMENT  Treatment will vary depending on the severity of your symptoms. Your health care provider may recommend:  Over-the-counter medicines (antacids, acid reducers) for mild heartburn.  Prescription medicines to decrease stomach acid or to protect your stomach lining.  Certain changes in your diet.  Elevating the head of your bed  by putting blocks under the legs. This helps prevent stomach acid from backing up into the esophagus when you are lying down. HOME CARE INSTRUCTIONS   Only take over-the-counter or prescription medicines as directed by your health care provider.  Raise the head of your bed by putting blocks under the legs if instructed to do so by your health care provider. Sleeping with more pillows is not effective because it only changes the position of your head.  Do not exercise right after eating.  Avoid eating 2-3 hours before bed. Do not lie down right after eating.  Eat small meals throughout the day instead of three large meals.  Identify foods and beverages that make your symptoms worse and avoid them. Foods you may want to avoid include:  Peppers.  Chocolate.  High-fat foods, including fried foods.  Spicy foods.  Garlic and onions.  Citrus fruits, including oranges, grapefruit, lemons, and limes.  Food containing tomatoes or tomato products.  Mint.  Carbonated and caffeinated drinks.  Vinegar. SEEK MEDICAL CARE IF:  You have abdominal pain of any kind.  You feel burning in your upper abdomen or chest, especially after eating or lying down.  You have nausea and vomiting.  Your stomach feels upset after you eat. SEEK IMMEDIATE MEDICAL CARE IF:   You have severe chest pain that goes down your arm or into your jaw or neck.  You feel sweaty, dizzy, or light-headed.  You become short of breath.  You vomit blood.  You have difficulty or pain with swallowing.  You have bloody or black, tarry stools.  You have episodes of heartburn more than 3 times a week,  for more than 2 weeks. MAKE SURE YOU:  Understand these instructions.  Will watch your condition.  Will get help right away if you are not doing well or get worse.   This information is not intended to replace advice given to you by your health care provider. Make sure you discuss any questions you have with  your health care provider.   Document Released: 03/16/2000 Document Revised: 04/09/2014 Document Reviewed: 11/05/2012 Elsevier Interactive Patient Education 2016 [employer].  Constipation, Adult Constipation is when a person has fewer than three bowel movements a week, has difficulty having a bowel movement, or has stools that are dry, hard, or larger than normal. As people grow older, constipation is more common. A low-fiber diet, not taking in enough fluids, and taking certain medicines may make constipation worse.  CAUSES   Certain medicines, such as antidepressants, pain medicine, iron supplements, antacids, and water pills.   Certain diseases, such as diabetes, irritable bowel syndrome (IBS), thyroid disease, or depression.   Not drinking enough water.   Not eating enough fiber-rich foods.   Stress or travel.   Lack of physical activity or exercise.   Ignoring the urge to have a bowel movement.   Using laxatives too much.  SIGNS AND SYMPTOMS   Having fewer than three bowel movements a week.   Straining to have a bowel movement.   Having stools that are hard, dry, or larger than normal.   Feeling full or bloated.   Pain in the lower abdomen.   Not feeling relief after having a bowel movement.  DIAGNOSIS  Your health care provider will take a medical history and perform a physical exam. Further testing may be done for severe constipation. Some tests may include:  A barium enema X-ray to examine your rectum, colon, and, sometimes, your small intestine.   A sigmoidoscopy to examine your lower colon.   A colonoscopy to examine your entire colon. TREATMENT  Treatment will depend on the severity of your constipation and what is causing it. Some dietary treatments include drinking more fluids and eating more fiber-rich foods. Lifestyle treatments may include regular exercise. If these diet and lifestyle recommendations do not help, your health care  provider may recommend taking over-the-counter laxative medicines to help you have bowel movements. Prescription medicines may be prescribed if over-the-counter medicines do not work.  HOME CARE INSTRUCTIONS   Eat foods that have a lot of fiber, such as fruits, vegetables, whole grains, and beans.  Limit foods high in fat and processed sugars, such as french fries, hamburgers, cookies, candies, and soda.   A fiber supplement may be added to your diet if you cannot get enough fiber from foods.   Drink enough fluids to keep your urine clear or pale yellow.   Exercise regularly or as directed by your health care provider.   Go to the restroom when you have the urge to go. Do not hold it.   Only take over-the-counter or prescription medicines as directed by your health care provider. Do not take other medicines for constipation without talking to your health care provider first.  SEEK IMMEDIATE MEDICAL CARE IF:   You have bright red blood in your stool.   Your constipation lasts for more than 4 days or gets worse.   You have abdominal or rectal pain.   You have thin, pencil-like stools.   You have unexplained weight loss. MAKE SURE YOU:   Understand these instructions.  Will watch your condition.  Will  get help right away if you are not doing well or get worse.   This information is not intended to replace advice given to you by your health care provider. Make sure you discuss any questions you have with your health care provider.   Document Released: 12/16/2003 Document Revised: 04/09/2014 Document Reviewed: 12/29/2012 Elsevier Interactive Patient Education [employer]2016 Elsevier Inc.

## 2015-02-10 ENCOUNTER — Encounter: Payer: Self-pay | Admitting: Obstetrics

## 2015-02-10 ENCOUNTER — Ambulatory Visit (INDEPENDENT_AMBULATORY_CARE_PROVIDER_SITE_OTHER): Payer: Medicaid Other | Admitting: Obstetrics

## 2015-02-10 ENCOUNTER — Ambulatory Visit (INDEPENDENT_AMBULATORY_CARE_PROVIDER_SITE_OTHER): Payer: Medicaid Other

## 2015-02-10 VITALS — BP 136/80 | HR 83 | Temp 98.7°F | Wt 341.0 lb

## 2015-02-10 DIAGNOSIS — O9989 Other specified diseases and conditions complicating pregnancy, childbirth and the puerperium: Secondary | ICD-10-CM

## 2015-02-10 DIAGNOSIS — O09522 Supervision of elderly multigravida, second trimester: Secondary | ICD-10-CM

## 2015-02-10 DIAGNOSIS — O26899 Other specified pregnancy related conditions, unspecified trimester: Secondary | ICD-10-CM

## 2015-02-10 DIAGNOSIS — R109 Unspecified abdominal pain: Secondary | ICD-10-CM

## 2015-02-10 DIAGNOSIS — O99212 Obesity complicating pregnancy, second trimester: Secondary | ICD-10-CM

## 2015-02-10 DIAGNOSIS — Z0189 Encounter for other specified special examinations: Secondary | ICD-10-CM

## 2015-02-10 DIAGNOSIS — K5901 Slow transit constipation: Secondary | ICD-10-CM

## 2015-02-10 DIAGNOSIS — Z3482 Encounter for supervision of other normal pregnancy, second trimester: Secondary | ICD-10-CM

## 2015-02-10 LAB — POCT URINALYSIS DIPSTICK
BILIRUBIN UA: NEGATIVE
Glucose, UA: NEGATIVE
KETONES UA: NEGATIVE
LEUKOCYTES UA: NEGATIVE
Nitrite, UA: NEGATIVE
PH UA: 7
PROTEIN UA: NEGATIVE
RBC UA: NEGATIVE
SPEC GRAV UA: 1.01
Urobilinogen, UA: NEGATIVE

## 2015-02-10 MED ORDER — DOCUSATE SODIUM 100 MG PO CAPS: 100.0000 mg | ORAL_CAPSULE | Freq: Two times a day (BID) | ORAL | Status: DC

## 2015-02-10 NOTE — Progress Notes (Addendum)
  Subjective:    [name] is a 34 y.o. female being seen today for her obstetrical visit. She is at 6965w1d gestation. Patient reports: Constipation  Problem List Items Addressed This Visit    None    Visit Diagnoses    Encounter for supervision of other normal pregnancy in second trimester    -  Primary    Relevant Medications    docusate sodium (COLACE) 100 MG capsule    Other Relevant Orders    POCT urinalysis dipstick (Completed)    Abdominal pain affecting pregnancy        Relevant Orders    US OB Limited    Slow transit constipation          Patient Active Problem List   Diagnosis Date Noted  . Advanced maternal age in multigravida 01/17/2015  . [redacted] weeks gestation of pregnancy   . Anemia 07/24/2014  . Morbid obesity (HCC) 07/22/2014  . Cellulitis of right lower extremity 07/22/2014  . Prediabetes 07/22/2014  . Fissure in skin of foot 07/22/2014  . GERD without esophagitis 08/28/2012    Objective:     BP 136/80 mmHg  Pulse 83  Temp(Src) 98.7 F (37.1 C)  Wt 341 lb (154.677 kg)  LMP 10/07/2014 Uterine Size: Below umbilicus     Assessment:    Pregnancy @ 2665w1d  weeks Doing well.  Constipation.  Plan:   MOM Rx along with Colace and prune juice and dietary changes.  Problem list reviewed and updated. Labs reviewed.  Follow up in 4 weeks. FIRST/CF mutation testing/NIPT/QUAD SCREEN/fragile X/Ashkenazi Jewish population testing/Spinal muscular atrophy discussed: requested. Role of ultrasound in pregnancy discussed; fetal survey: requested. Amniocentesis discussed: declined.

## 2015-02-28 ENCOUNTER — Encounter (HOSPITAL_COMMUNITY): Payer: Self-pay

## 2015-02-28 ENCOUNTER — Other Ambulatory Visit (HOSPITAL_COMMUNITY): Payer: Self-pay | Admitting: Obstetrics and Gynecology

## 2015-02-28 ENCOUNTER — Ambulatory Visit (HOSPITAL_COMMUNITY)
Admission: RE | Admit: 2015-02-28 | Discharge: 2015-02-28 | Disposition: A | Discharge status: Home / Self Care | Payer: Medicaid Other | Source: Ambulatory Visit | Attending: Obstetrics | Admitting: Obstetrics

## 2015-02-28 VITALS — BP 112/67 | HR 84

## 2015-02-28 DIAGNOSIS — Z3A18 18 weeks gestation of pregnancy: Secondary | ICD-10-CM | POA: Insufficient documentation

## 2015-02-28 DIAGNOSIS — O09522 Supervision of elderly multigravida, second trimester: Secondary | ICD-10-CM | POA: Diagnosis not present

## 2015-02-28 DIAGNOSIS — Z36 Encounter for antenatal screening of mother: Secondary | ICD-10-CM | POA: Insufficient documentation

## 2015-02-28 DIAGNOSIS — O34219 Maternal care for unspecified type scar from previous cesarean delivery: Secondary | ICD-10-CM

## 2015-02-28 DIAGNOSIS — Z3689 Encounter for other specified antenatal screening: Secondary | ICD-10-CM

## 2015-02-28 DIAGNOSIS — O99212 Obesity complicating pregnancy, second trimester: Secondary | ICD-10-CM | POA: Insufficient documentation

## 2015-02-28 DIAGNOSIS — IMO0002 Reserved for concepts with insufficient information to code with codable children: Secondary | ICD-10-CM

## 2015-02-28 DIAGNOSIS — Z0489 Encounter for examination and observation for other specified reasons: Secondary | ICD-10-CM

## 2015-03-04 ENCOUNTER — Other Ambulatory Visit: Payer: Self-pay | Admitting: Certified Nurse Midwife

## 2015-03-10 ENCOUNTER — Ambulatory Visit (INDEPENDENT_AMBULATORY_CARE_PROVIDER_SITE_OTHER): Payer: Medicaid Other | Admitting: Obstetrics

## 2015-03-10 ENCOUNTER — Encounter: Payer: Self-pay | Admitting: Obstetrics

## 2015-03-10 VITALS — BP 124/74 | HR 95 | Temp 98.8°F | Wt 341.0 lb

## 2015-03-10 DIAGNOSIS — Z3482 Encounter for supervision of other normal pregnancy, second trimester: Secondary | ICD-10-CM

## 2015-03-10 LAB — POCT URINALYSIS DIPSTICK
Bilirubin, UA: NEGATIVE
Blood, UA: NEGATIVE
GLUCOSE UA: NEGATIVE
Ketones, UA: NEGATIVE
LEUKOCYTES UA: NEGATIVE
Nitrite, UA: NEGATIVE
PROTEIN UA: NEGATIVE
Spec Grav, UA: 1.015
UROBILINOGEN UA: NEGATIVE
pH, UA: 7

## 2015-03-10 NOTE — Progress Notes (Signed)
Subjective:    [name] is a 34 y.o. female being seen today for her obstetrical visit. She is at 4736w1d gestation. Patient reports: no complaints . Fetal movement: normal.  Problem List Items Addressed This Visit    None    Visit Diagnoses    Supervision of other normal pregnancy, antepartum, second trimester    -  Primary    Relevant Orders    POCT urinalysis dipstick (Completed)      Patient Active Problem List   Diagnosis Date Noted  . Advanced maternal age in multigravida 01/17/2015  . [redacted] weeks gestation of pregnancy   . Anemia 07/24/2014  . Morbid obesity (HCC) 07/22/2014  . Cellulitis of right lower extremity 07/22/2014  . Prediabetes 07/22/2014  . Fissure in skin of foot 07/22/2014  . GERD without esophagitis 08/28/2012   Objective:    BP 124/74 mmHg  Pulse 95  Temp(Src) 98.8 F (37.1 C)  Wt 341 lb (154.677 kg)  LMP 10/07/2014 FHT: 150 BPM  Uterine Size: unable to feel due to obesity     Assessment:    Pregnancy @ 2136w1d    Plan:      Labs, problem list reviewed and updated 2 hr GTT planned Follow up in 4 weeks.

## 2015-03-29 ENCOUNTER — Ambulatory Visit (HOSPITAL_COMMUNITY): Payer: Medicaid Other

## 2015-03-30 ENCOUNTER — Ambulatory Visit (HOSPITAL_COMMUNITY)
Admission: RE | Admit: 2015-03-30 | Discharge: 2015-03-30 | Disposition: A | Discharge status: Home / Self Care | Payer: Medicaid Other | Source: Ambulatory Visit | Attending: Obstetrics | Admitting: Obstetrics

## 2015-03-30 VITALS — BP 121/72 | HR 78 | Wt 339.8 lb

## 2015-03-30 DIAGNOSIS — O99212 Obesity complicating pregnancy, second trimester: Secondary | ICD-10-CM | POA: Insufficient documentation

## 2015-03-30 DIAGNOSIS — O34219 Maternal care for unspecified type scar from previous cesarean delivery: Secondary | ICD-10-CM | POA: Insufficient documentation

## 2015-03-30 DIAGNOSIS — Z36 Encounter for antenatal screening of mother: Secondary | ICD-10-CM | POA: Insufficient documentation

## 2015-03-30 DIAGNOSIS — O09522 Supervision of elderly multigravida, second trimester: Secondary | ICD-10-CM | POA: Diagnosis present

## 2015-03-30 DIAGNOSIS — Z3A23 23 weeks gestation of pregnancy: Secondary | ICD-10-CM | POA: Diagnosis not present

## 2015-03-30 DIAGNOSIS — Z0489 Encounter for examination and observation for other specified reasons: Secondary | ICD-10-CM

## 2015-03-30 DIAGNOSIS — IMO0002 Reserved for concepts with insufficient information to code with codable children: Secondary | ICD-10-CM

## 2015-04-07 ENCOUNTER — Encounter: Payer: Self-pay | Admitting: Obstetrics

## 2015-04-07 ENCOUNTER — Ambulatory Visit (INDEPENDENT_AMBULATORY_CARE_PROVIDER_SITE_OTHER): Payer: Medicaid Other | Admitting: Obstetrics

## 2015-04-07 VITALS — BP 108/65 | HR 90 | Temp 98.2°F | Wt 339.0 lb

## 2015-04-07 DIAGNOSIS — Z3483 Encounter for supervision of other normal pregnancy, third trimester: Secondary | ICD-10-CM

## 2015-04-07 DIAGNOSIS — O99213 Obesity complicating pregnancy, third trimester: Secondary | ICD-10-CM

## 2015-04-07 DIAGNOSIS — O09523 Supervision of elderly multigravida, third trimester: Secondary | ICD-10-CM

## 2015-04-07 NOTE — Progress Notes (Signed)
Subjective:    [name] is a 35 y.o. female being seen today for her obstetrical visit. She is at 2566w1d gestation. Patient reports: no complaints . Fetal movement: normal.  Problem List Items Addressed This Visit    None     Patient Active Problem List   Diagnosis Date Noted  . Advanced maternal age in multigravida 01/17/2015  . [redacted] weeks gestation of pregnancy   . Anemia 07/24/2014  . Morbid obesity (HCC) 07/22/2014  . Cellulitis of right lower extremity 07/22/2014  . Prediabetes 07/22/2014  . Fissure in skin of foot 07/22/2014  . GERD without esophagitis 08/28/2012   Objective:    BP 108/65 mmHg  Pulse 90  Temp(Src) 98.2 F (36.8 C)  Wt 339 lb (153.769 kg)  LMP 10/07/2014 FHT: 150 BPM  Uterine Size: undeterminable due to obesity     Assessment:    Pregnancy @ 3866w1d    Plan:    OBGCT: ordered for next visit. Signs and symptoms of preterm labor: discussed.  Labs, problem list reviewed and updated 2 hr GTT planned Follow up in 4 weeks.

## 2015-04-21 ENCOUNTER — Other Ambulatory Visit: Payer: Self-pay | Admitting: Obstetrics

## 2015-04-21 DIAGNOSIS — Z3492 Encounter for supervision of normal pregnancy, unspecified, second trimester: Secondary | ICD-10-CM

## 2015-04-21 DIAGNOSIS — K219 Gastro-esophageal reflux disease without esophagitis: Secondary | ICD-10-CM

## 2015-04-21 MED ORDER — OB COMPLETE PETITE 35-5-1-200 MG PO CAPS: 1.0000 | ORAL_CAPSULE | Freq: Every day | ORAL | Status: DC

## 2015-04-21 MED ORDER — ESOMEPRAZOLE MAGNESIUM 20 MG PO CPDR: 20.0000 mg | DELAYED_RELEASE_CAPSULE | Freq: Two times a day (BID) | ORAL | Status: DC

## 2015-05-05 ENCOUNTER — Other Ambulatory Visit: Payer: Medicaid Other

## 2015-05-05 ENCOUNTER — Encounter: Payer: Self-pay | Admitting: Obstetrics

## 2015-05-05 ENCOUNTER — Ambulatory Visit (INDEPENDENT_AMBULATORY_CARE_PROVIDER_SITE_OTHER): Payer: Medicaid Other | Admitting: Obstetrics

## 2015-05-05 VITALS — BP 110/75 | HR 107 | Temp 98.2°F | Wt 334.0 lb

## 2015-05-05 DIAGNOSIS — Z3483 Encounter for supervision of other normal pregnancy, third trimester: Secondary | ICD-10-CM

## 2015-05-05 LAB — POCT URINALYSIS DIPSTICK
Bilirubin, UA: NEGATIVE
Blood, UA: NEGATIVE
Glucose, UA: NEGATIVE
Leukocytes, UA: NEGATIVE
Nitrite, UA: NEGATIVE
PH UA: 5
PROTEIN UA: NEGATIVE
SPEC GRAV UA: 1.02
UROBILINOGEN UA: NEGATIVE

## 2015-05-05 LAB — CBC
HEMATOCRIT: 39.4 % (ref 36.0–46.0)
Hemoglobin: 13 g/dL (ref 12.0–15.0)
MCH: 27.4 pg (ref 26.0–34.0)
MCHC: 33 g/dL (ref 30.0–36.0)
MCV: 82.9 fL (ref 78.0–100.0)
MPV: 12.2 fL (ref 8.6–12.4)
PLATELETS: 241 10*3/uL (ref 150–400)
RBC: 4.75 MIL/uL (ref 3.87–5.11)
RDW: 13.8 % (ref 11.5–15.5)
WBC: 8.8 10*3/uL (ref 4.0–10.5)

## 2015-05-05 NOTE — Progress Notes (Signed)
Subjective:    [name] is a 35 y.o. female being seen today for her obstetrical visit. She is at [redacted]w[redacted]d gestation. Patient reports no complaints. Fetal movement: normal.  Problem List Items Addressed This Visit    None    Visit Diagnoses    Supervision of other normal pregnancy, antepartum, second trimester    -  Primary    Relevant Orders    POCT urinalysis dipstick    Glucose Tolerance, 2 Hours w/1 Hour    CBC    HIV antibody    RPR      Patient Active Problem List   Diagnosis Date Noted  . Advanced maternal age in multigravida 01/17/2015  . [redacted] weeks gestation of pregnancy   . Anemia 07/24/2014  . Morbid obesity (HCC) 07/22/2014  . Cellulitis of right lower extremity 07/22/2014  . Prediabetes 07/22/2014  . Fissure in skin of foot 07/22/2014  . GERD without esophagitis 08/28/2012   Objective:    BP 110/75 mmHg  Pulse 107  Temp(Src) 98.2 F (36.8 C)  Wt 334 lb (151.501 kg)  LMP 10/07/2014 FHT:  150 BPM  Uterine Size: size greater than dates  Presentation: unsure     Assessment:    Pregnancy @ [redacted]w[redacted]d weeks   Plan:     labs reviewed, problem list updated Consent signed. GBS sent TDAP offered  Rhogam given for RH negative Pediatrician: discussed. Infant feeding: plans to breastfeed. Maternity leave: discussed. Cigarette smoking: never smoked. Orders Placed This Encounter  Procedures  . Glucose Tolerance, 2 Hours w/1 Hour  . CBC  . HIV antibody  . RPR  . POCT urinalysis dipstick   No orders of the defined types were placed in this encounter.   Follow up in 2 Weeks.

## 2015-05-06 LAB — HIV ANTIBODY (ROUTINE TESTING W REFLEX): HIV 1&2 Ab, 4th Generation: NONREACTIVE

## 2015-05-06 LAB — GLUCOSE TOLERANCE, 2 HOURS W/ 1HR
GLUCOSE, 2 HOUR: 103 mg/dL (ref 70–139)
GLUCOSE, FASTING: 102 mg/dL — AB (ref 65–99)
GLUCOSE: 140 mg/dL (ref 70–170)

## 2015-05-06 LAB — RPR

## 2015-05-10 ENCOUNTER — Inpatient Hospital Stay (HOSPITAL_COMMUNITY)
Admission: AD | Admit: 2015-05-10 | Discharge: 2015-05-10 | Disposition: A | Discharge status: Home / Self Care | Payer: Medicaid Other | Source: Ambulatory Visit | Attending: Obstetrics | Admitting: Obstetrics

## 2015-05-10 ENCOUNTER — Encounter (HOSPITAL_COMMUNITY): Payer: Self-pay | Admitting: *Deleted

## 2015-05-10 DIAGNOSIS — Z6841 Body Mass Index (BMI) 40.0 and over, adult: Secondary | ICD-10-CM | POA: Diagnosis not present

## 2015-05-10 DIAGNOSIS — Z880 Allergy status to penicillin: Secondary | ICD-10-CM | POA: Insufficient documentation

## 2015-05-10 DIAGNOSIS — Z3A28 28 weeks gestation of pregnancy: Secondary | ICD-10-CM | POA: Insufficient documentation

## 2015-05-10 DIAGNOSIS — G8929 Other chronic pain: Secondary | ICD-10-CM | POA: Insufficient documentation

## 2015-05-10 DIAGNOSIS — M545 Low back pain: Secondary | ICD-10-CM | POA: Diagnosis not present

## 2015-05-10 DIAGNOSIS — O4703 False labor before 37 completed weeks of gestation, third trimester: Secondary | ICD-10-CM

## 2015-05-10 DIAGNOSIS — M1990 Unspecified osteoarthritis, unspecified site: Secondary | ICD-10-CM | POA: Diagnosis not present

## 2015-05-10 HISTORY — DX: Body Mass Index (BMI) 40.0 and over, adult: Z684

## 2015-05-10 HISTORY — DX: Morbid (severe) obesity due to excess calories: E66.01

## 2015-05-10 LAB — FETAL FIBRONECTIN: Fetal Fibronectin: NEGATIVE

## 2015-05-10 LAB — WET PREP, GENITAL
Clue Cells Wet Prep HPF POC: NONE SEEN
Sperm: NONE SEEN
Trich, Wet Prep: NONE SEEN
Yeast Wet Prep HPF POC: NONE SEEN

## 2015-05-10 LAB — CBC
HCT: 38.4 % (ref 36.0–46.0)
Hemoglobin: 12.7 g/dL (ref 12.0–15.0)
MCH: 27.6 pg (ref 26.0–34.0)
MCHC: 33.1 g/dL (ref 30.0–36.0)
MCV: 83.5 fL (ref 78.0–100.0)
Platelets: 202 10*3/uL (ref 150–400)
RBC: 4.6 MIL/uL (ref 3.87–5.11)
RDW: 13.8 % (ref 11.5–15.5)
WBC: 10.5 10*3/uL (ref 4.0–10.5)

## 2015-05-10 LAB — URINALYSIS, ROUTINE W REFLEX MICROSCOPIC
BILIRUBIN URINE: NEGATIVE
Glucose, UA: NEGATIVE mg/dL
HGB URINE DIPSTICK: NEGATIVE
Ketones, ur: 15 mg/dL — AB
LEUKOCYTES UA: NEGATIVE
Nitrite: NEGATIVE
PROTEIN: 30 mg/dL — AB
SPECIFIC GRAVITY, URINE: 1.015 (ref 1.005–1.030)
pH: 7 (ref 5.0–8.0)

## 2015-05-10 LAB — URINE MICROSCOPIC-ADD ON: RBC / HPF: NONE SEEN RBC/hpf (ref 0–5)

## 2015-05-10 MED ORDER — NIFEDIPINE 10 MG PO CAPS
10.0000 mg | ORAL_CAPSULE | Freq: Once | ORAL | Status: AC
Start: 2015-05-10 — End: 2015-05-10
  Administered 2015-05-10: 10 mg via ORAL
  Filled 2015-05-10: qty 1

## 2015-05-10 MED ORDER — BUTORPHANOL TARTRATE 1 MG/ML IJ SOLN
2.0000 mg | Freq: Once | INTRAMUSCULAR | Status: AC
  Administered 2015-05-10: 2 mg via INTRAVENOUS
  Filled 2015-05-10: qty 2

## 2015-05-10 MED ORDER — LACTATED RINGERS IV BOLUS (SEPSIS)
1000.0000 mL | Freq: Once | INTRAVENOUS | Status: AC
  Administered 2015-05-10: 1000 mL via INTRAVENOUS

## 2015-05-10 MED ORDER — LACTATED RINGERS IV SOLN: INTRAVENOUS | Status: DC

## 2015-05-10 NOTE — MAU Note (Signed)
C/O intermittent lower abdominal pain since yesterday around 1500. Not sure if it is contractions. No bleeding or leaking.

## 2015-05-10 NOTE — MAU Provider Note (Signed)
History     CSN: [id number]  Arrival date and time: 05/10/15 4098   First Provider Initiated Contact with Patient 05/10/15 1034      Chief Complaint  Patient presents with  . Contractions   HPI  [name] 35 y.o. [id number]  presents to MAU stating that she has been contracting since last night. She denies vaginal bleeding, LOF. Reports positive fetal movement  Past Medical History  Diagnosis Date  . Complication of anesthesia     "makes me shake"  . Arthritis     "back; right foot; left ankle" (07/22/2014)  . Chronic lower back pain   . Cellulitis of leg, right 07/2014  . Morbid obesity with BMI of 50.0-59.9, adult [facility])     Past Surgical History  Procedure Laterality Date  . Cesarean section  1994; 2010  . Dilation and curettage of uterus  ~ 2008    Family History  Problem Relation Age of Onset  . Hypertension Mother   . Diabetes Maternal Grandfather     Social History  Substance Use Topics  . Smoking status: Never Smoker   . Smokeless tobacco: Never Used  . Alcohol Use: No    Allergies:  Allergies  Allergen Reactions  . Penicillins Hives and Other (See Comments)    Has patient had a PCN reaction causing immediate rash, facial/tongue/throat swelling, SOB or lightheadedness with hypotension: No Has patient had a PCN reaction causing severe rash involving mucus membranes or skin necrosis: No Has patient had a PCN reaction that required hospitalization No Has patient had a PCN reaction occurring within the last 10 years: No If all of the above answers are "NO", then may proceed with Cephalosporin use.    Prescriptions prior to admission  Medication Sig Dispense Refill Last Dose  . docusate sodium (COLACE) 100 MG capsule Take 1 capsule (100 mg total) by mouth 2 (two) times daily. 100 capsule prn Past Week at Unknown time  . esomeprazole (NEXIUM) 20 MG capsule Take 1 capsule (20 mg total) by mouth 2 (two) times daily before a meal. 60 capsule 11 Past  Week at Unknown time  . Prenat-FeCbn-FeAspGl-FA-Omega (OB COMPLETE PETITE) 35-5-1-200 MG CAPS Take 1 capsule by mouth daily before breakfast. 90 capsule 3 05/09/2015 at Unknown time  . polyethylene glycol (MIRALAX / GLYCOLAX) packet Take 17 g by mouth daily as needed for moderate constipation. (Patient not taking: Reported on 05/05/2015) 14 each 1 Not Taking    Review of Systems  Constitutional: Negative for fever.  Gastrointestinal: Positive for abdominal pain.  All other systems reviewed and are negative.  Physical Exam   Blood pressure 127/59, pulse 94, temperature 98.1 F (36.7 C), temperature source Oral, resp. rate 20, height  (1.6 m), weight 150.594 kg (332 lb), last menstrual period 10/07/2014.  Physical Exam  Nursing note and vitals reviewed. Constitutional: She is oriented to person, place, and time. She appears well-developed and well-nourished. No distress.  HENT:  Head: Normocephalic and atraumatic.  Neck: Normal range of motion.  Cardiovascular: Normal rate.   Respiratory: Breath sounds normal. No respiratory distress.  GI: There is no tenderness.  Genitourinary: Vagina normal.  Musculoskeletal: Normal range of motion.  Neurological: She is alert and oriented to person, place, and time.  Skin: Skin is warm and dry.  Psychiatric: She has a normal mood and affect. Her behavior is normal. Judgment and thought content normal.   Results for orders placed or performed during the hospital encounter of 05/10/15 (from the  past 24 hour(s))  Urinalysis, Routine w reflex microscopic (not at [facility])     Status: Abnormal   Collection Time: 05/10/15  9:44 AM  Result Value Ref Range   Color, Urine YELLOW YELLOW   APPearance CLEAR CLEAR   Specific Gravity, Urine 1.015 1.005 - 1.030   pH 7.0 5.0 - 8.0   Glucose, UA NEGATIVE NEGATIVE mg/dL   Hgb urine dipstick NEGATIVE NEGATIVE   Bilirubin Urine NEGATIVE NEGATIVE   Ketones, ur 15 (A) NEGATIVE mg/dL   Protein, ur 30 (A) NEGATIVE  mg/dL   Nitrite NEGATIVE NEGATIVE   Leukocytes, UA NEGATIVE NEGATIVE  Urine microscopic-add on     Status: Abnormal   Collection Time: 05/10/15  9:44 AM  Result Value Ref Range   Squamous Epithelial / LPF 0-5 (A) NONE SEEN   WBC, UA 0-5 0 - 5 WBC/hpf   RBC / HPF NONE SEEN 0 - 5 RBC/hpf   Bacteria, UA RARE (A) NONE SEEN  Fetal fibronectin     Status: Abnormal   Collection Time: 05/10/15 10:36 AM  Result Value Ref Range   Fetal Fibronectin NEGATIVE NEGATIVE   Appearance, FETFIB VALID (A) CLEAR  Wet prep, genital     Status: Abnormal   Collection Time: 05/10/15 10:36 AM  Result Value Ref Range   Yeast Wet Prep HPF POC NONE SEEN NONE SEEN   Trich, Wet Prep NONE SEEN NONE SEEN   Clue Cells Wet Prep HPF POC NONE SEEN NONE SEEN   WBC, Wet Prep HPF POC FEW (A) NONE SEEN   Sperm NONE SEEN   CBC     Status: None   Collection Time: 05/10/15 11:04 AM  Result Value Ref Range   WBC 10.5 4.0 - 10.5 K/uL   RBC 4.60 3.87 - 5.11 MIL/uL   Hemoglobin 12.7 12.0 - 15.0 g/dL   HCT 78.2 95.6 - 21.3 %   MCV 83.5 78.0 - 100.0 fL   MCH 27.6 26.0 - 34.0 pg   MCHC 33.1 30.0 - 36.0 g/dL   RDW 08.6 57.8 - 46.9 %   Platelets 202 150 - 400 K/uL    FT/50/Anterior -2 MAU Course  Procedures  MDM IVF and Stadol nd 1 dose Procardia. Pt is feeling etter, Contractions are gone. FHr cat 1. F/u with Dr Clearance Coots. Cat 1 FHR- Uterine irritability improved.   Assessment and Plan  Preterm contractions Follow up with Dr. Clearance Coots Discharge   [name] 05/10/2015, 12:57 PM

## 2015-05-10 NOTE — MAU Note (Signed)
Urine sent to Lab

## 2015-05-10 NOTE — Discharge Instructions (Signed)
Braxton Hicks Contractions °Contractions of the uterus can occur throughout pregnancy. Contractions are not always a sign that you are in labor.  °WHAT ARE BRAXTON HICKS CONTRACTIONS?  °Contractions that occur before labor are called Braxton Hicks contractions, or false labor. Toward the end of pregnancy (32-34 weeks), these contractions can develop more often and may become more forceful. This is not true labor because these contractions do not result in opening (dilatation) and thinning of the cervix. They are sometimes difficult to tell apart from true labor because these contractions can be forceful and people have different pain tolerances. You should not feel embarrassed if you go to the hospital with false labor. Sometimes, the only way to tell if you are in true labor is for your health care provider to look for changes in the cervix. °If there are no prenatal problems or other health problems associated with the pregnancy, it is completely safe to be sent home with false labor and await the onset of true labor. °HOW CAN YOU TELL THE DIFFERENCE BETWEEN TRUE AND FALSE LABOR? °False Labor °· The contractions of false labor are usually shorter and not as hard as those of true labor.   °· The contractions are usually irregular.   °· The contractions are often felt in the front of the lower abdomen and in the groin.   °· The contractions may go away when you walk around or change positions while lying down.   °· The contractions get weaker and are shorter lasting as time goes on.   °· The contractions do not usually become progressively stronger, regular, and closer together as with true labor.   °True Labor °· Contractions in true labor last 30-70 seconds, become very regular, usually become more intense, and increase in frequency.   °· The contractions do not go away with walking.   °· The discomfort is usually felt in the top of the uterus and spreads to the lower abdomen and low back.   °· True labor can be  determined by your health care provider with an exam. This will show that the cervix is dilating and getting thinner.   °WHAT TO REMEMBER °· Keep up with your usual exercises and follow other instructions given by your health care provider.   °· Take medicines as directed by your health care provider.   °· Keep your regular prenatal appointments.   °· Eat and drink lightly if you think you are going into labor.   °· If Braxton Hicks contractions are making you uncomfortable:   °¨ Change your position from lying down or resting to walking, or from walking to resting.   °¨ Sit and rest in a tub of warm water.   °¨ Drink 2-3 glasses of water. Dehydration may cause these contractions.   °¨ Do slow and deep breathing several times an hour.   °WHEN SHOULD I SEEK IMMEDIATE MEDICAL CARE? °Seek immediate medical care if: °· Your contractions become stronger, more regular, and closer together.   °· You have fluid leaking or gushing from your vagina.   °· You have a fever.   °· You pass blood-tinged mucus.   °· You have vaginal bleeding.   °· You have continuous abdominal pain.   °· You have low back pain that you never had before.   °· You feel your baby's head pushing down and causing pelvic pressure.   °· Your baby is not moving as much as it used to.   °  °This information is not intended to replace advice given to you by your health care provider. Make sure you discuss any questions you have with your health care   provider. °  °Document Released: 03/19/2005 Document Revised: 03/24/2013 Document Reviewed: 12/29/2012 °Elsevier Interactive Patient Education ©2016 Elsevier Inc[employer] ° °Preterm Labor Information °Preterm labor is when labor starts at less than 37 weeks of pregnancy. The normal length of a pregnancy is 39 to 41 weeks. °CAUSES °Often, there is no identifiable underlying cause as to why a woman goes into preterm labor. One of the most common known causes of preterm labor is infection. Infections of the uterus, cervix,  vagina, amniotic sac, bladder, kidney, or even the lungs (pneumonia) can cause labor to start. Other suspected causes of preterm labor include:  °· Urogenital infections, such as yeast infections and bacterial vaginosis.   °· Uterine abnormalities (uterine shape, uterine septum, fibroids, or bleeding from the placenta).   °· A cervix that has been operated on (it may fail to stay closed).   °· Malformations in the fetus.   °· Multiple gestations (twins, triplets, and so on).   °· Breakage of the amniotic sac.   °RISK FACTORS °· Having a previous history of preterm labor.   °· Having premature rupture of membranes (PROM).   °· Having a placenta that covers the opening of the cervix (placenta previa).   °· Having a placenta that separates from the uterus (placental abruption).   °· Having a cervix that is too weak to hold the fetus in the uterus (incompetent cervix).   °· Having too much fluid in the amniotic sac (polyhydramnios).   °· Taking illegal drugs or smoking while pregnant.   °· Not gaining enough weight while pregnant.   °· Being younger than 18 and older than 35 years old.   °· Having a low socioeconomic status.   °· Being African American. °SYMPTOMS °Signs and symptoms of preterm labor include:  °· Menstrual-like cramps, abdominal pain, or back pain. °· Uterine contractions that are regular, as frequent as six in an hour, regardless of their intensity (may be mild or painful). °· Contractions that start on the top of the uterus and spread down to the lower abdomen and back.   °· A sense of increased pelvic pressure.   °· A watery or bloody mucus discharge that comes from the vagina.   °TREATMENT °Depending on the length of the pregnancy and other circumstances, your health care provider may suggest bed rest. If necessary, there are medicines that can be given to stop contractions and to mature the fetal lungs. If labor happens before 34 weeks of pregnancy, a prolonged hospital stay may be recommended.  Treatment depends on the condition of both you and the fetus.  °WHAT SHOULD YOU DO IF YOU THINK YOU ARE IN PRETERM LABOR? °Call your health care provider right away. You will need to go to the hospital to get checked immediately. °HOW CAN YOU PREVENT PRETERM LABOR IN FUTURE PREGNANCIES? °You should:  °· Stop smoking if you smoke.  °· Maintain healthy weight gain and avoid chemicals and drugs that are not necessary. °· Be watchful for any type of infection. °· Inform your health care provider if you have a known history of preterm labor. °  °This information is not intended to replace advice given to you by your health care provider. Make sure you discuss any questions you have with your health care provider. °  °Document Released: 06/09/2003 Document Revised: 11/19/2012 Document Reviewed: 04/21/2012 °Elsevier Interactive Patient Education ©2016 Elsevier Inc. ° °

## 2015-05-11 ENCOUNTER — Ambulatory Visit (HOSPITAL_COMMUNITY)
Admission: RE | Admit: 2015-05-11 | Discharge: 2015-05-11 | Disposition: A | Discharge status: Home / Self Care | Payer: Medicaid Other | Source: Ambulatory Visit | Attending: Obstetrics | Admitting: Obstetrics

## 2015-05-11 ENCOUNTER — Other Ambulatory Visit (HOSPITAL_COMMUNITY): Payer: Self-pay | Admitting: Maternal and Fetal Medicine

## 2015-05-11 DIAGNOSIS — O09522 Supervision of elderly multigravida, second trimester: Secondary | ICD-10-CM

## 2015-05-11 DIAGNOSIS — O99213 Obesity complicating pregnancy, third trimester: Secondary | ICD-10-CM

## 2015-05-11 DIAGNOSIS — Z3A29 29 weeks gestation of pregnancy: Secondary | ICD-10-CM | POA: Insufficient documentation

## 2015-05-11 DIAGNOSIS — IMO0002 Reserved for concepts with insufficient information to code with codable children: Secondary | ICD-10-CM

## 2015-05-11 DIAGNOSIS — O34219 Maternal care for unspecified type scar from previous cesarean delivery: Secondary | ICD-10-CM | POA: Diagnosis not present

## 2015-05-11 DIAGNOSIS — Z0489 Encounter for examination and observation for other specified reasons: Secondary | ICD-10-CM

## 2015-05-11 DIAGNOSIS — O09523 Supervision of elderly multigravida, third trimester: Secondary | ICD-10-CM

## 2015-05-11 DIAGNOSIS — O99210 Obesity complicating pregnancy, unspecified trimester: Secondary | ICD-10-CM | POA: Insufficient documentation

## 2015-05-11 LAB — GC/CHLAMYDIA PROBE AMP (~~LOC~~) NOT AT ARMC
Chlamydia: NEGATIVE
Neisseria Gonorrhea: NEGATIVE

## 2015-05-19 ENCOUNTER — Encounter: Payer: Medicaid Other | Admitting: Obstetrics

## 2015-05-31 ENCOUNTER — Encounter: Payer: Medicaid Other | Admitting: Obstetrics

## 2015-05-31 ENCOUNTER — Ambulatory Visit (INDEPENDENT_AMBULATORY_CARE_PROVIDER_SITE_OTHER): Payer: Medicaid Other | Admitting: Obstetrics

## 2015-05-31 VITALS — BP 125/73 | HR 101 | Temp 98.9°F | Wt 334.0 lb

## 2015-05-31 DIAGNOSIS — Z3483 Encounter for supervision of other normal pregnancy, third trimester: Secondary | ICD-10-CM

## 2015-05-31 LAB — POCT URINALYSIS DIPSTICK
BILIRUBIN UA: NEGATIVE
Blood, UA: NEGATIVE
Glucose, UA: NEGATIVE
KETONES UA: NEGATIVE
LEUKOCYTES UA: NEGATIVE
Nitrite, UA: NEGATIVE
PH UA: 5
SPEC GRAV UA: 1.025
Urobilinogen, UA: NEGATIVE

## 2015-06-01 ENCOUNTER — Encounter: Payer: Self-pay | Admitting: Obstetrics

## 2015-06-01 NOTE — Progress Notes (Signed)
Subjective:    [name] is a 35 y.o. female being seen today for her obstetrical visit. She is at [redacted]w[redacted]d gestation. Patient reports no complaints. Fetal movement: normal.  Problem List Items Addressed This Visit    None    Visit Diagnoses    Supervision of other normal pregnancy, antepartum, third trimester    -  Primary    Relevant Orders    POCT urinalysis dipstick (Completed)      Patient Active Problem List   Diagnosis Date Noted  . Advanced maternal age in multigravida 01/17/2015  . [redacted] weeks gestation of pregnancy   . Anemia 07/24/2014  . Morbid obesity (HCC) 07/22/2014  . Cellulitis of right lower extremity 07/22/2014  . Prediabetes 07/22/2014  . Fissure in skin of foot 07/22/2014  . GERD without esophagitis 08/28/2012   Objective:    BP 125/73 mmHg  Pulse 101  Temp(Src) 98.9 F (37.2 C)  Wt 334 lb (151.501 kg)  LMP 10/07/2014 FHT:  150 BPM  Uterine Size: size equals dates  Presentation: unsure     Assessment:    Pregnancy @ [redacted]w[redacted]d weeks   Plan:     labs reviewed, problem list updated Consent signed. GBS sent TDAP offered  Rhogam given for RH negative Pediatrician: discussed. Infant feeding: plans to breastfeed. Maternity leave: discussed. Cigarette smoking: never smoked. Orders Placed This Encounter  Procedures  . POCT urinalysis dipstick   No orders of the defined types were placed in this encounter.   Follow up in 2 Weeks.

## 2015-06-09 ENCOUNTER — Encounter (HOSPITAL_COMMUNITY): Payer: Self-pay | Admitting: Anesthesiology

## 2015-06-09 ENCOUNTER — Encounter (HOSPITAL_COMMUNITY): Payer: Self-pay | Admitting: *Deleted

## 2015-06-09 ENCOUNTER — Inpatient Hospital Stay (HOSPITAL_COMMUNITY)
Admission: AD | Admit: 2015-06-09 | Discharge: 2015-06-09 | Disposition: A | Discharge status: Home / Self Care | Payer: Medicaid Other | Source: Ambulatory Visit | Attending: Obstetrics | Admitting: Obstetrics

## 2015-06-09 DIAGNOSIS — Z880 Allergy status to penicillin: Secondary | ICD-10-CM | POA: Insufficient documentation

## 2015-06-09 DIAGNOSIS — O99213 Obesity complicating pregnancy, third trimester: Secondary | ICD-10-CM | POA: Diagnosis not present

## 2015-06-09 DIAGNOSIS — O26893 Other specified pregnancy related conditions, third trimester: Secondary | ICD-10-CM | POA: Insufficient documentation

## 2015-06-09 DIAGNOSIS — Z3A33 33 weeks gestation of pregnancy: Secondary | ICD-10-CM | POA: Diagnosis not present

## 2015-06-09 DIAGNOSIS — O9989 Other specified diseases and conditions complicating pregnancy, childbirth and the puerperium: Secondary | ICD-10-CM | POA: Diagnosis not present

## 2015-06-09 DIAGNOSIS — Z6841 Body Mass Index (BMI) 40.0 and over, adult: Secondary | ICD-10-CM | POA: Diagnosis not present

## 2015-06-09 DIAGNOSIS — O26899 Other specified pregnancy related conditions, unspecified trimester: Secondary | ICD-10-CM

## 2015-06-09 DIAGNOSIS — K5900 Constipation, unspecified: Secondary | ICD-10-CM | POA: Diagnosis not present

## 2015-06-09 DIAGNOSIS — R109 Unspecified abdominal pain: Secondary | ICD-10-CM | POA: Diagnosis not present

## 2015-06-09 DIAGNOSIS — R1030 Lower abdominal pain, unspecified: Secondary | ICD-10-CM | POA: Diagnosis present

## 2015-06-09 LAB — URINALYSIS, ROUTINE W REFLEX MICROSCOPIC
Bilirubin Urine: NEGATIVE
GLUCOSE, UA: NEGATIVE mg/dL
HGB URINE DIPSTICK: NEGATIVE
Ketones, ur: 40 mg/dL — AB
Nitrite: NEGATIVE
Protein, ur: NEGATIVE mg/dL
SPECIFIC GRAVITY, URINE: 1.015 (ref 1.005–1.030)
pH: 6.5 (ref 5.0–8.0)

## 2015-06-09 LAB — URINE MICROSCOPIC-ADD ON: RBC / HPF: NONE SEEN RBC/hpf (ref 0–5)

## 2015-06-09 MED ORDER — ONDANSETRON 4 MG PO TBDP
4.0000 mg | ORAL_TABLET | Freq: Once | ORAL | Status: AC
  Administered 2015-06-09: 4 mg via ORAL
  Filled 2015-06-09: qty 1

## 2015-06-09 MED ORDER — OXYCODONE-ACETAMINOPHEN 5-325 MG PO TABS
2.0000 | ORAL_TABLET | Freq: Once | ORAL | Status: AC
  Administered 2015-06-09: 2 via ORAL
  Filled 2015-06-09: qty 2

## 2015-06-09 MED ORDER — FLEET ENEMA 7-19 GM/118ML RE ENEM
1.0000 | ENEMA | Freq: Once | RECTAL | Status: AC
  Administered 2015-06-09: 1 via RECTAL

## 2015-06-09 MED ORDER — DOCUSATE SODIUM 100 MG PO CAPS: 100.0000 mg | ORAL_CAPSULE | Freq: Two times a day (BID) | ORAL | Status: DC

## 2015-06-09 MED ORDER — CYCLOBENZAPRINE HCL 10 MG PO TABS
10.0000 mg | ORAL_TABLET | Freq: Once | ORAL | Status: AC
  Administered 2015-06-09: 10 mg via ORAL
  Filled 2015-06-09: qty 1

## 2015-06-09 MED ORDER — DEXTROSE 5 % IN LACTATED RINGERS IV BOLUS
1000.0000 mL | Freq: Once | INTRAVENOUS | Status: AC
  Administered 2015-06-09: 1000 mL via INTRAVENOUS

## 2015-06-09 NOTE — MAU Provider Note (Signed)
History     CSN: [id number]  Arrival date and time: 06/09/15 0212   First Provider Initiated Contact with Patient 06/09/15 0239      Chief Complaint  Patient presents with  . Abdominal Pain   HPI Comments: [name] is a 35 y.o. [phone] at [redacted]w[redacted]d who presents today with lower abdominal pain. She states that the pain comes and goes. She denies any VB or LOF. She confirms fetal movement. No hx of preterm delivery. Patient denies complications with this pregnancy.   Abdominal Pain This is a new problem. The current episode started yesterday. The onset quality is gradual. The problem occurs constantly. The problem has been waxing and waning. The pain is located in the suprapubic region. The pain is at a severity of 7/10. The quality of the pain is cramping. The abdominal pain radiates to the back. Associated symptoms include constipation ("constipated all the time". Last BM about 2 days ago. ) and vomiting. Pertinent negatives include no diarrhea, dysuria, fever, frequency or nausea. Exacerbated by: when the fetus is moving  The pain is relieved by nothing. She has tried nothing for the symptoms.    Past Medical History  Diagnosis Date  . Complication of anesthesia     "makes me shake"  . Arthritis     "back; right foot; left ankle" (07/22/2014)  . Chronic lower back pain   . Cellulitis of leg, right 07/2014  . Morbid obesity with BMI of 50.0-59.9, adult [facility])     Past Surgical History  Procedure Laterality Date  . Cesarean section  1994; 2010  . Dilation and curettage of uterus  ~ 2008    Family History  Problem Relation Age of Onset  . Hypertension Mother   . Diabetes Maternal Grandfather     Social History  Substance Use Topics  . Smoking status: Never Smoker   . Smokeless tobacco: Never Used  . Alcohol Use: No    Allergies:  Allergies  Allergen Reactions  . Penicillins Hives and Other (See Comments)    Has patient had a PCN reaction causing immediate rash,  facial/tongue/throat swelling, SOB or lightheadedness with hypotension: No Has patient had a PCN reaction causing severe rash involving mucus membranes or skin necrosis: No Has patient had a PCN reaction that required hospitalization No Has patient had a PCN reaction occurring within the last 10 years: No If all of the above answers are "NO", then may proceed with Cephalosporin use.    Prescriptions prior to admission  Medication Sig Dispense Refill Last Dose  . docusate sodium (COLACE) 100 MG capsule Take 1 capsule (100 mg total) by mouth 2 (two) times daily. 100 capsule prn Past Month at Unknown time  . esomeprazole (NEXIUM) 20 MG capsule Take 1 capsule (20 mg total) by mouth 2 (two) times daily before a meal. 60 capsule 11 Past Month at Unknown time  . polyethylene glycol (MIRALAX / GLYCOLAX) packet Take 17 g by mouth daily as needed for moderate constipation. 14 each 1 Past Month at Unknown time  . Prenat-FeCbn-FeAspGl-FA-Omega (OB COMPLETE PETITE) 35-5-1-200 MG CAPS Take 1 capsule by mouth daily before breakfast. 90 capsule 3 06/08/2015 at Unknown time    Review of Systems  Constitutional: Negative for fever and chills.  Gastrointestinal: Positive for vomiting, abdominal pain and constipation ("constipated all the time". Last BM about 2 days ago. ). Negative for nausea and diarrhea.  Genitourinary: Negative for dysuria, urgency and frequency.   Physical Exam   Last menstrual  period 10/07/2014.  Physical Exam  Nursing note and vitals reviewed. Constitutional: She is oriented to person, place, and time. She appears well-developed and well-nourished. No distress.  HENT:  Head: Normocephalic.  Cardiovascular: Normal rate.   Respiratory: Effort normal.  GI: Soft. There is no tenderness. There is no rebound.  Genitourinary:  Cervix: closed/thick/high   Neurological: She is alert and oriented to person, place, and time.  Skin: Skin is warm and dry.  Psychiatric: She has a normal mood  and affect.   FHT: 145, moderate, 15x 15 accels, no decels Toco: no UCs   Results for orders placed or performed during the hospital encounter of 06/09/15 (from the past 24 hour(s))  Urinalysis, Routine w reflex microscopic (not [facility])     Status: Abnormal   Collection Time: 06/09/15  2:18 AM  Result Value Ref Range   Color, Urine YELLOW YELLOW   APPearance CLEAR CLEAR   Specific Gravity, Urine 1.015 1.005 - 1.030   pH 6.5 5.0 - 8.0   Glucose, UA NEGATIVE NEGATIVE mg/dL   Hgb urine dipstick NEGATIVE NEGATIVE   Bilirubin Urine NEGATIVE NEGATIVE   Ketones, ur 40 (A) NEGATIVE mg/dL   Protein, ur NEGATIVE NEGATIVE mg/dL   Nitrite NEGATIVE NEGATIVE   Leukocytes, UA TRACE (A) NEGATIVE  Urine microscopic-add on     Status: Abnormal   Collection Time: 06/09/15  2:18 AM  Result Value Ref Range   Squamous Epithelial / LPF 0-5 (A) NONE SEEN   WBC, UA 0-5 0 - 5 WBC/hpf   RBC / HPF NONE SEEN 0 - 5 RBC/hpf   Bacteria, UA FEW (A) NONE SEEN   Urine-Other MUCOUS PRESENT     MAU Course  Procedures  MDM Patient states that she has not had a BM 3+ days. Will give enema at this time. Good results with enema.   Assessment and Plan   1. Constipation, unspecified constipation type   2. Abdominal pain in pregnancy   3. [redacted] weeks gestation of pregnancy    DC home Comfort measures reviewed  3rd Trimester precautions  PTL precautions  Fetal kick counts RX: colace BID #[id number]  Return to MAU as needed FU with OB as planned  Follow-up Information    Follow up with Brock BadHARPER,CHARLES A, MD.   Specialty:  Obstetrics and Gynecology   Why:  As scheduled   Contact information:   [address] [place] [address] [phone]         Tawnya CrookHogan, Heather Donovan 06/09/2015, 2:40 AM

## 2015-06-09 NOTE — MAU Note (Signed)
PT  SAYS SHE STARTED  FEELING LOWER CRAMPS  AT 12 NOON-  AND HAVE  GOTTEN  WORSE.   SHE  CALLED  OFFICE- AT    3 PM - TOLD  TO COME  HERE.   THOUGHT  IT  WAS GAS-  TOOK SHOWER-   SO THEN  CAME  HERE.   NO VE IN OFFICE .    LAST SEX-    2 WEEKS   AGO.              FEELS   CONSTANT PAIN  LOWER ABD AND  BACK THEN SOMETIMES  WORSE.

## 2015-06-09 NOTE — Discharge Instructions (Signed)
High-Fiber Diet  Fiber, also called dietary fiber, is a type of carbohydrate found in fruits, vegetables, whole grains, and beans. A high-fiber diet can have many health benefits. Your health care provider may recommend a high-fiber diet to help:  · Prevent constipation. Fiber can make your bowel movements more regular.  · Lower your cholesterol.  · Relieve hemorrhoids, uncomplicated diverticulosis, or irritable bowel syndrome.  · Prevent overeating as part of a weight-loss plan.  · Prevent heart disease, type 2 diabetes, and certain cancers.  WHAT IS MY PLAN?  The recommended daily intake of fiber includes:  · 38 grams for men under age 50.  · 30 grams for men over age 50.  · 25 grams for women under age 50.  · 21 grams for women over age 50.  You can get the recommended daily intake of dietary fiber by eating a variety of fruits, vegetables, grains, and beans. Your health care provider may also recommend a fiber supplement if it is not possible to get enough fiber through your diet.  WHAT DO I NEED TO KNOW ABOUT A HIGH-FIBER DIET?  · Fiber supplements have not been widely studied for their effectiveness, so it is better to get fiber through food sources.  · Always check the fiber content on the nutrition facts label of any prepackaged food. Look for foods that contain at least 5 grams of fiber per serving.  · Ask your dietitian if you have questions about specific foods that are related to your condition, especially if those foods are not listed in the following section.  · Increase your daily fiber consumption gradually. Increasing your intake of dietary fiber too quickly may cause bloating, cramping, or gas.  · Drink plenty of water. Water helps you to digest fiber.  WHAT FOODS CAN I EAT?  Grains  Whole-grain breads. Multigrain cereal. Oats and oatmeal. Brown rice. Barley. Bulgur wheat. Millet. Bran muffins. Popcorn. Rye wafer crackers.  Vegetables  Sweet potatoes. Spinach. Kale. Artichokes. Cabbage. Broccoli.  Green peas. Carrots. Squash.  Fruits  Berries. Pears. Apples. Oranges. Avocados. Prunes and raisins. Dried figs.  Meats and Other Protein Sources  Navy, kidney, pinto, and soy beans. Split peas. Lentils. Nuts and seeds.  Dairy  Fiber-fortified yogurt.  Beverages  Fiber-fortified soy milk. Fiber-fortified orange juice.  Other  Fiber bars.  The items listed above may not be a complete list of recommended foods or beverages. Contact your dietitian for more options.  WHAT FOODS ARE NOT RECOMMENDED?  Grains  White bread. Pasta made with refined flour. White rice.  Vegetables  Fried potatoes. Canned vegetables. Well-cooked vegetables.   Fruits  Fruit juice. Cooked, strained fruit.  Meats and Other Protein Sources  Fatty cuts of meat. Fried poultry or fried fish.  Dairy  Milk. Yogurt. Cream cheese. Sour cream.  Beverages  Soft drinks.  Other  Cakes and pastries. Butter and oils.  The items listed above may not be a complete list of foods and beverages to avoid. Contact your dietitian for more information.  WHAT ARE SOME TIPS FOR INCLUDING HIGH-FIBER FOODS IN MY DIET?  · Eat a wide variety of high-fiber foods.  · Make sure that half of all grains consumed each day are whole grains.  · Replace breads and cereals made from refined flour or white flour with whole-grain breads and cereals.  · Replace white rice with brown rice, bulgur wheat, or millet.  · Start the day with a breakfast that is high in fiber, such as a   cereal that contains at least 5 grams of fiber per serving.  · Use beans in place of meat in soups, salads, or pasta.  · Eat high-fiber snacks, such as berries, raw vegetables, nuts, or popcorn.     This information is not intended to replace advice given to you by your health care provider. Make sure you discuss any questions you have with your health care provider.     Document Released: 03/19/2005 Document Revised: 04/09/2014 Document Reviewed: 09/01/2013  Elsevier Interactive Patient Education ©2016 [employer]  Inc.

## 2015-06-16 ENCOUNTER — Ambulatory Visit (INDEPENDENT_AMBULATORY_CARE_PROVIDER_SITE_OTHER): Payer: Medicaid Other | Admitting: Obstetrics

## 2015-06-16 ENCOUNTER — Observation Stay (HOSPITAL_COMMUNITY)
Admission: AD | Admit: 2015-06-16 | Discharge: 2015-06-17 | Disposition: A | Discharge status: Home / Self Care | Payer: Medicaid Other | Source: Ambulatory Visit | Attending: Obstetrics | Admitting: Obstetrics

## 2015-06-16 ENCOUNTER — Encounter (HOSPITAL_COMMUNITY): Payer: Self-pay | Admitting: *Deleted

## 2015-06-16 ENCOUNTER — Encounter: Payer: Self-pay | Admitting: Obstetrics

## 2015-06-16 ENCOUNTER — Encounter: Payer: Self-pay | Admitting: *Deleted

## 2015-06-16 VITALS — BP 120/83 | HR 101 | Wt 327.0 lb

## 2015-06-16 DIAGNOSIS — O09893 Supervision of other high risk pregnancies, third trimester: Secondary | ICD-10-CM | POA: Insufficient documentation

## 2015-06-16 DIAGNOSIS — Z872 Personal history of diseases of the skin and subcutaneous tissue: Secondary | ICD-10-CM | POA: Diagnosis not present

## 2015-06-16 DIAGNOSIS — R197 Diarrhea, unspecified: Secondary | ICD-10-CM | POA: Diagnosis not present

## 2015-06-16 DIAGNOSIS — Z880 Allergy status to penicillin: Secondary | ICD-10-CM | POA: Insufficient documentation

## 2015-06-16 DIAGNOSIS — O212 Late vomiting of pregnancy: Principal | ICD-10-CM | POA: Insufficient documentation

## 2015-06-16 DIAGNOSIS — M1990 Unspecified osteoarthritis, unspecified site: Secondary | ICD-10-CM | POA: Insufficient documentation

## 2015-06-16 DIAGNOSIS — O99213 Obesity complicating pregnancy, third trimester: Secondary | ICD-10-CM

## 2015-06-16 DIAGNOSIS — K529 Noninfective gastroenteritis and colitis, unspecified: Secondary | ICD-10-CM | POA: Diagnosis present

## 2015-06-16 DIAGNOSIS — A084 Viral intestinal infection, unspecified: Secondary | ICD-10-CM | POA: Diagnosis not present

## 2015-06-16 DIAGNOSIS — O99891 Other specified diseases and conditions complicating pregnancy: Secondary | ICD-10-CM

## 2015-06-16 DIAGNOSIS — O9989 Other specified diseases and conditions complicating pregnancy, childbirth and the puerperium: Secondary | ICD-10-CM | POA: Insufficient documentation

## 2015-06-16 DIAGNOSIS — E662 Morbid (severe) obesity with alveolar hypoventilation: Secondary | ICD-10-CM | POA: Insufficient documentation

## 2015-06-16 DIAGNOSIS — Z3A34 34 weeks gestation of pregnancy: Secondary | ICD-10-CM | POA: Diagnosis not present

## 2015-06-16 DIAGNOSIS — O99613 Diseases of the digestive system complicating pregnancy, third trimester: Secondary | ICD-10-CM | POA: Diagnosis not present

## 2015-06-16 DIAGNOSIS — O09523 Supervision of elderly multigravida, third trimester: Secondary | ICD-10-CM

## 2015-06-16 LAB — CBC
HEMATOCRIT: 38 % (ref 36.0–46.0)
HEMOGLOBIN: 12.6 g/dL (ref 12.0–15.0)
MCH: 27.6 pg (ref 26.0–34.0)
MCHC: 33.2 g/dL (ref 30.0–36.0)
MCV: 83.2 fL (ref 78.0–100.0)
Platelets: 168 10*3/uL (ref 150–400)
RBC: 4.57 MIL/uL (ref 3.87–5.11)
RDW: 14 % (ref 11.5–15.5)
WBC: 8.1 10*3/uL (ref 4.0–10.5)

## 2015-06-16 LAB — TYPE AND SCREEN
ABO/RH(D): B POS
Antibody Screen: NEGATIVE

## 2015-06-16 LAB — COMPREHENSIVE METABOLIC PANEL
ALK PHOS: 66 U/L (ref 38–126)
ALT: 25 U/L (ref 14–54)
ANION GAP: 9 (ref 5–15)
AST: 32 U/L (ref 15–41)
Albumin: 3.1 g/dL — ABNORMAL LOW (ref 3.5–5.0)
BILIRUBIN TOTAL: 0.5 mg/dL (ref 0.3–1.2)
BUN: <5 mg/dL — ABNORMAL LOW (ref 6–20)
CALCIUM: 8.9 mg/dL (ref 8.9–10.3)
CO2: 18 mmol/L — ABNORMAL LOW (ref 22–32)
Chloride: 109 mmol/L (ref 101–111)
Creatinine, Ser: 0.52 mg/dL (ref 0.44–1.00)
Glucose, Bld: 90 mg/dL (ref 65–99)
Potassium: 3.9 mmol/L (ref 3.5–5.1)
Sodium: 136 mmol/L (ref 135–145)
TOTAL PROTEIN: 6.9 g/dL (ref 6.5–8.1)

## 2015-06-16 LAB — URINALYSIS, ROUTINE W REFLEX MICROSCOPIC
Bilirubin Urine: NEGATIVE
GLUCOSE, UA: NEGATIVE mg/dL
Hgb urine dipstick: NEGATIVE
LEUKOCYTES UA: NEGATIVE
Nitrite: NEGATIVE
PH: 6 (ref 5.0–8.0)
Protein, ur: NEGATIVE mg/dL
SPECIFIC GRAVITY, URINE: 1.02 (ref 1.005–1.030)

## 2015-06-16 MED ORDER — ZOLPIDEM TARTRATE 5 MG PO TABS: 5.0000 mg | ORAL_TABLET | Freq: Every evening | ORAL | Status: DC | PRN

## 2015-06-16 MED ORDER — LACTATED RINGERS IV BOLUS (SEPSIS)
1000.0000 mL | Freq: Once | INTRAVENOUS | Status: AC
  Administered 2015-06-16: 1000 mL via INTRAVENOUS

## 2015-06-16 MED ORDER — ACETAMINOPHEN 325 MG PO TABS: 650.0000 mg | ORAL_TABLET | ORAL | Status: DC | PRN

## 2015-06-16 MED ORDER — PROMETHAZINE HCL 25 MG PO TABS
25.0000 mg | ORAL_TABLET | Freq: Four times a day (QID) | ORAL | Status: DC | PRN
  Administered 2015-06-16: 25 mg via ORAL
  Filled 2015-06-16: qty 1

## 2015-06-16 MED ORDER — PRENATAL MULTIVITAMIN CH
1.0000 | ORAL_TABLET | Freq: Every day | ORAL | Status: DC
Start: 2015-06-16 — End: 2015-06-17
  Administered 2015-06-17: 1 via ORAL
  Filled 2015-06-16 (×3): qty 1

## 2015-06-16 MED ORDER — PROMETHAZINE HCL 25 MG/ML IJ SOLN
25.0000 mg | Freq: Once | INTRAMUSCULAR | Status: AC
  Administered 2015-06-16: 25 mg via INTRAVENOUS
  Filled 2015-06-16: qty 1

## 2015-06-16 MED ORDER — LACTATED RINGERS IV SOLN
INTRAVENOUS | Status: DC
  Administered 2015-06-16 – 2015-06-17 (×3): via INTRAVENOUS

## 2015-06-16 MED ORDER — LOPERAMIDE HCL 2 MG PO CAPS
2.0000 mg | ORAL_CAPSULE | ORAL | Status: DC | PRN
  Administered 2015-06-16: 2 mg via ORAL
  Filled 2015-06-16 (×2): qty 1

## 2015-06-16 MED ORDER — DOCUSATE SODIUM 100 MG PO CAPS
100.0000 mg | ORAL_CAPSULE | Freq: Every day | ORAL | Status: DC
  Filled 2015-06-16 (×3): qty 1

## 2015-06-16 MED ORDER — CALCIUM CARBONATE ANTACID 500 MG PO CHEW
2.0000 | CHEWABLE_TABLET | ORAL | Status: DC | PRN
  Filled 2015-06-16: qty 2

## 2015-06-16 MED ORDER — PROMETHAZINE HCL 25 MG/ML IJ SOLN: 25.0000 mg | Freq: Four times a day (QID) | INTRAMUSCULAR | Status: DC | PRN

## 2015-06-16 NOTE — Progress Notes (Signed)
Pt is having diarrhea and vomitting x 2-3 days, can't keep anything down.

## 2015-06-16 NOTE — MAU Note (Signed)
Pt has had N/V/D fo a few days sent in to MAU by Dr. Clearance CootsHarper

## 2015-06-16 NOTE — H&P (Signed)
[name] is a 35 y.o. female presenting for N/V Ronny Flurryand diarrhea for several days. Maternal Medical History:  Reason for admission: Nausea.  N/V and diarrhea for several days.     Contractions: Perceived severity is mild.    Fetal activity: Perceived fetal activity is normal.   Last perceived fetal movement was within the past hour.    Prenatal complications: no prenatal complications Prenatal Complications - Diabetes: none.    OB History    Gravida Para Term Preterm AB TAB SAB Ectopic Multiple Living   5 3 3  1  1   3      Past Medical History  Diagnosis Date  . Complication of anesthesia     "makes me shake"  . Arthritis     "back; right foot; left ankle" (07/22/2014)  . Chronic lower back pain   . Cellulitis of leg, right 07/2014  . Morbid obesity with BMI of 50.0-59.9, adult [facility](HCC)    Past Surgical History  Procedure Laterality Date  . Cesarean section  1994; 2010  . Dilation and curettage of uterus  ~ 2008   Family History: family history includes Diabetes in her maternal grandfather; Hypertension in her mother. Social History:  reports that she has never smoked. She has never used smokeless tobacco. She reports that she does not drink alcohol or use illicit drugs.   Prenatal Transfer Tool  Maternal Diabetes: No Genetic Screening: Normal Maternal Ultrasounds/Referrals: Normal Fetal Ultrasounds or other Referrals:  None Maternal Substance Abuse:  No Significant Maternal Medications:  None Significant Maternal Lab Results:  None Other Comments:  None  Review of Systems  Gastrointestinal: Positive for nausea, vomiting and diarrhea.  All other systems reviewed and are negative.     Blood pressure 116/72, pulse 113, temperature 98.7 F (37.1 C), resp. rate 20, height 5\' 3"  (1.6 m), last menstrual period 10/07/2014. Maternal Exam:  Abdomen: Patient reports no abdominal tenderness.   Physical Exam  Nursing note and vitals reviewed. Constitutional: She is  oriented to person, place, and time. She appears well-developed and well-nourished.  HENT:  Head: Normocephalic and atraumatic.  Eyes: Conjunctivae are normal. Pupils are equal, round, and reactive to light.  Neck: Normal range of motion. Neck supple.  Cardiovascular: Normal rate and regular rhythm.   Respiratory: Effort normal and breath sounds normal.  GI: Soft. Bowel sounds are normal.  Neurological: She is alert and oriented to person, place, and time.  Skin: Skin is warm and dry.  Psychiatric: She has a normal mood and affect. Her behavior is normal. Judgment and thought content normal.    Prenatal labs: ABO, Rh: B/POS/-- (09/15 1118) Antibody: NEG (09/15 1118) Rubella: 2.14 (09/15 1118) RPR: NON REAC (02/02 0948)  HBsAg: NEGATIVE (09/15 1118)  HIV: NONREACTIVE (02/02 0948)  GBS:     Assessment/Plan: 34 weeks.  Gastroenteritis, dehydration.  Admit for IVF hydration and antiemetic therapy.   [name] A 06/16/2015, 1:21 PM

## 2015-06-16 NOTE — Progress Notes (Signed)
Subjective:    [name] is a 35 y.o. female being seen today for her obstetrical visit. She is at 6468w1d gestation. Patient reports fatigue, nausea, vomiting and diarrhea. Fetal movement: normal.  Problem List Items Addressed This Visit    None     Patient Active Problem List   Diagnosis Date Noted  . Advanced maternal age in multigravida 01/17/2015  . [redacted] weeks gestation of pregnancy   . Anemia 07/24/2014  . Morbid obesity (HCC) 07/22/2014  . Cellulitis of right lower extremity 07/22/2014  . Prediabetes 07/22/2014  . Fissure in skin of foot 07/22/2014  . GERD without esophagitis 08/28/2012   Objective:    BP 120/83 mmHg  Pulse 101  Wt 327 lb (148.326 kg)  LMP 10/07/2014 FHT:  not taken BPM  Uterine Size: size greater than dates  Presentation: unsure     Assessment:    Pregnancy @ 2068w1d weeks    N/V and diarrhea.  Feeling weak.  Plan:    [facility]   labs reviewed, problem list updated Consent signed. GBS sent TDAP offered  Rhogam given for RH negative Pediatrician: discussed. Infant feeding: plans to breastfeed. Maternity leave: discussed. Cigarette smoking: never smoked. No orders of the defined types were placed in this encounter.   No orders of the defined types were placed in this encounter.   Follow up in 1 Week.

## 2015-06-16 NOTE — MAU Provider Note (Signed)
Chief Complaint:  Nausea; Emesis; and Diarrhea   First Provider Initiated Contact with Patient 06/16/15 1128     HPI: [name] is a 35 y.o. [id number] at [redacted]w[redacted]d who was sent to maternity admissions from [facility] reporting N/V/D x 2-3 days.   Duration: 2-3 days Course: worsening Modifying factors: Hasn't taken antiemetics or antidiarrheal medications. Associated signs and symptoms: Negative for fever, chills  Denies contractions, leakage of fluid or vaginal bleeding. Good fetal movement.   Pregnancy Course:   Past Medical History: Past Medical History  Diagnosis Date  . Complication of anesthesia     "makes me shake"  . Arthritis     "back; right foot; left ankle" (07/22/2014)  . Chronic lower back pain   . Cellulitis of leg, right 07/2014  . Morbid obesity with BMI of 50.0-59.9, adult (HCC)     Past obstetric history: OB History  Gravida Para Term Preterm AB SAB TAB Ectopic Multiple Living  # Outcome Date GA Lbr Len/2nd Weight Sex Delivery Anes PTL Lv  5 Current           4 Term 12/24/08    [name]   Y  3 SAB 04/02/06          2 Term 02/11/97    F Vag-Spont   Y  1 Term 12/29/92    [name]      Past Surgical History: Past Surgical History  Procedure Laterality Date  . Cesarean section  1994; 2010  . Dilation and curettage of uterus  ~ 2008     Family History: Family History  Problem Relation Age of Onset  . Hypertension Mother   . Diabetes Maternal Grandfather     Social History: Social History  Substance Use Topics  . Smoking status: Never Smoker   . Smokeless tobacco: Never Used  . Alcohol Use: No    Allergies:  Allergies  Allergen Reactions  . Penicillins Hives and Other (See Comments)    Has patient had a PCN reaction causing immediate rash, facial/tongue/throat swelling, SOB or lightheadedness with hypotension: No Has patient had a PCN reaction causing severe rash involving mucus membranes or skin necrosis: No Has  patient had a PCN reaction that required hospitalization No Has patient had a PCN reaction occurring within the last 10 years: No If all of the above answers are "NO", then may proceed with Cephalosporin use.    Meds:  Prescriptions prior to admission  Medication Sig Dispense Refill Last Dose  . esomeprazole (NEXIUM) 20 MG capsule Take 1 capsule (20 mg total) by mouth 2 (two) times daily before a meal. 60 capsule 11 Past Week at Unknown time  . Prenat-FeCbn-FeAspGl-FA-Omega (OB COMPLETE PETITE) 35-5-1-200 MG CAPS Take 1 capsule by mouth daily before breakfast. 90 capsule 3 06/15/2015 at Unknown time  . docusate sodium (COLACE) 100 MG capsule Take 1 capsule (100 mg total) by mouth 2 (two) times daily. (Patient not taking: Reported on 06/16/2015) 100 capsule prn Past Month at Unknown time  . docusate sodium (COLACE) 100 MG capsule Take 1 capsule (100 mg total) by mouth 2 (two) times daily. (Patient not taking: Reported on 06/16/2015) 60 capsule 0   . polyethylene glycol (MIRALAX / GLYCOLAX) packet Take 17 g by mouth daily as needed for moderate constipation. (Patient not taking: Reported on 06/16/2015) 14 each 1 Past Month at Unknown time    I have reviewed  patient's Past Medical Hx, Surgical Hx, Family Hx, Social Hx, medications and allergies.   ROS:  Review of Systems  Constitutional: Negative for fever and chills.  Gastrointestinal: Positive for nausea, vomiting and diarrhea. Negative for abdominal pain and constipation.  Genitourinary: Negative for dysuria, flank pain and vaginal bleeding.  Musculoskeletal: Negative for back pain.  Neurological: Positive for weakness.    Physical Exam   Patient Vitals for the past 24 hrs:  BP Temp Temp src Pulse Resp Height Weight  06/16/15 1519 (!) 112/58 mmHg 98.7 F (37.1 C) Oral (!) 105 16 - -  06/16/15 1400 (!) 106/48 mmHg 98.7 F (37.1 C) Oral - 18 5\' 3"  (1.6 m) (!) 327 lb (148.326 kg)  06/16/15 1040 116/72 mmHg 98.7 F (37.1 C) - 113 20 5\' 3"   (1.6 m) -   Constitutional: Well-developed, well-nourished female in no acute distress.  Head: Mucous membranes moist. Cardiovascular: Mild tachycardia. Respiratory: normal effort GI: Gravid appropriate for gestational age. Neurologic: Alert and oriented x 4.  GU: Deferred FHT:  Baseline 125 , moderate variability, accelerations present, no decelerations Contractions: None   Labs: Results for orders placed or performed during the hospital encounter of 06/16/15 (from the past 24 hour(s))  CBC     Status: None   Collection Time: 06/16/15 11:16 AM  Result Value Ref Range   WBC 8.1 4.0 - 10.5 K/uL   RBC 4.57 3.87 - 5.11 MIL/uL   Hemoglobin 12.6 12.0 - 15.0 g/dL   HCT 40.938.0 81.136.0 - 91.446.0 %   MCV 83.2 78.0 - 100.0 fL   MCH 27.6 26.0 - 34.0 pg   MCHC 33.2 30.0 - 36.0 g/dL   RDW 78.214.0 95.611.5 - 21.315.5 %   Platelets 168 150 - 400 K/uL  Comprehensive metabolic panel     Status: Abnormal   Collection Time: 06/16/15 11:16 AM  Result Value Ref Range   Sodium 136 135 - 145 mmol/L   Potassium 3.9 3.5 - 5.1 mmol/L   Chloride 109 101 - 111 mmol/L   CO2 18 (L) 22 - 32 mmol/L   Glucose, Bld 90 65 - 99 mg/dL   BUN <5 (L) 6 - 20 mg/dL   Creatinine, Ser 0.860.52 0.44 - 1.00 mg/dL   Calcium 8.9 8.9 - 57.810.3 mg/dL   Total Protein 6.9 6.5 - 8.1 g/dL   Albumin 3.1 (L) 3.5 - 5.0 g/dL   AST 32 15 - 41 U/L   ALT 25 14 - 54 U/L   Alkaline Phosphatase 66 38 - 126 U/L   Total Bilirubin 0.5 0.3 - 1.2 mg/dL   GFR calc non Af Amer >60 >60 mL/min   GFR calc Af Amer >60 >60 mL/min   Anion gap 9 5 - 15    Imaging:  No results found.  MAU Course: Phenergan, Imodium, IV bolus, CBC, CMP per Dr. Clearance CootsHarper.  Nausea and vomiting resolved. No diarrhea on maternity admissions. Tolerating fluids by mouth. Dr. Clearance CootsHarper informed. Concerned the patient is a difficult IV stick and wants to admit her to antenatal for observation to make sure she is able to eat and drink adequately.  MDM: - 35 year old female at 8434 weeks 1 day  gestation with presumed acute gastroenteritis. - Fetal heart rate reactive. No obstetric concerns.  Assessment: 1. Acute gastroenteritis   2. Current maternal conditions classifiable elsewhere, antepartum     Plan: Antenatal for 23 hour observation per consult with Dr. Clearance CootsHarper. Dr. Clearance CootsHarper assuming care of patient.  [place], [place] 06/16/2015  1:20 PM

## 2015-06-17 NOTE — Progress Notes (Signed)
Patient ID: Ronny FlurryLalita N Hum, female   DOB: [birth year], 35 y.o.   MRN: [id number] Hospital Day: 2  S: Feeling better.  No N/V or diarrhea all day.  O: Blood pressure 110/64, pulse 90, temperature 98 F (36.7 C), temperature source Oral, resp. rate 20, height 5\' 3"  (1.6 m), weight 327 lb (148.326 kg), last menstrual period 10/07/2014.   WUJ:WJXBJYNWFHT:Baseline: 140 bpm Toco: None SVE:   A/P- 35 y.o. admitted with:  N/V and diarrhea.  Much better today.  Discharge home.  F/U in office.  Present on Admission:  . Gastroenteritis  Pregnancy Complications: gastroenteritis  Preterm labor management: no treatment necessary Dating:  4413w2d PNL Needed:  none FWB:  good PTL:  stable

## 2015-06-17 NOTE — Discharge Summary (Signed)
Physician Discharge Summary  Patient ID: Christina FlurryLalita N Ohlin MRN: [id number] DOB/AGE: 10/12/1980 35 y.o.  Admit date: 06/16/2015 Discharge date: 06/17/2015  Admission Diagnoses:  34 weeks.  Gastroenteritis  Discharge Diagnoses:  Same Active Problems:   Gastroenteritis   Discharged Condition: good  Hospital Course: Admitted with N/V and diarrhea.  Improved with IV hydration and supportive therapy.  Discharged home in good condition.  Consults: None  Significant Diagnostic Studies: labs: CBC, CMET, U/A  Treatments: IV hydration and antiemetics and antidiarrheals   Discharge Exam: Blood pressure 110/64, pulse 90, temperature 98 F (36.7 C), temperature source Oral, resp. rate 20, height 5\' 3"  (1.6 m), weight 327 lb (148.326 kg), last menstrual period 10/07/2014. General appearance: alert and no distress Resp: clear to auscultation bilaterally Cardio: regular rate and rhythm, S1, S2 normal, no murmur, click, rub or gallop GI: soft, non-tender; bowel sounds normal; no masses,  no organomegaly Extremities: extremities normal, atraumatic, no cyanosis or edema  Disposition: 01-Home or Self Care  Discharge Instructions    Discharge activity:  No Restrictions    Complete by:  As directed      Discharge diet:  No restrictions    Complete by:  As directed      No sexual activity restrictions    Complete by:  As directed             Medication List    TAKE these medications        docusate sodium 100 MG capsule  Commonly known as:  COLACE  Take 1 capsule (100 mg total) by mouth 2 (two) times daily.     esomeprazole 20 MG capsule  Commonly known as:  NEXIUM  Take 1 capsule (20 mg total) by mouth 2 (two) times daily before a meal.     OB COMPLETE PETITE 35-5-1-200 MG Caps  Take 1 capsule by mouth daily before breakfast.     polyethylene glycol packet  Commonly known as:  MIRALAX / GLYCOLAX  Take 17 g by mouth daily as needed for moderate constipation.            Follow-up Information    Follow up with Sharren Schnurr A, MD. Schedule an appointment as soon as possible for a visit in 1 week.   Specialty:  Obstetrics and Gynecology   Contact information:   [address] [place] [address] [phone]       Signed: Brock BadHARPER,Anatasia Tino A 06/17/2015, 3:01 PM

## 2015-06-17 NOTE — Discharge Instructions (Signed)
Fetal Movement Counts  Patient Name: __________________________________________________ Patient Due Date: ____________________  Performing a fetal movement count is highly recommended in high-risk pregnancies, but it is good for every pregnant woman to do. Your health care provider may ask you to start counting fetal movements at 28 weeks of the pregnancy. Fetal movements often increase:  · After eating a full meal.  · After physical activity.  · After eating or drinking something sweet or cold.  · At rest.  Pay attention to when you feel the baby is most active. This will help you notice a pattern of your baby's sleep and wake cycles and what factors contribute to an increase in fetal movement. It is important to perform a fetal movement count at the same time each day when your baby is normally most active.   HOW TO COUNT FETAL MOVEMENTS  1. Find a quiet and comfortable area to sit or lie down on your left side. Lying on your left side provides the best blood and oxygen circulation to your baby.  2. Write down the day and time on a sheet of paper or in a journal.  3. Start counting kicks, flutters, swishes, rolls, or jabs in a 2-hour period. You should feel at least 10 movements within 2 hours.  4. If you do not feel 10 movements in 2 hours, wait 2-3 hours and count again. Look for a change in the pattern or not enough counts in 2 hours.  SEEK MEDICAL CARE IF:  · You feel less than 10 counts in 2 hours, tried twice.  · There is no movement in over an hour.  · The pattern is changing or taking longer each day to reach 10 counts in 2 hours.  · You feel the baby is not moving as he or she usually does.  Date: ____________ Movements: ____________ Start time: ____________ Finish time: ____________   Date: ____________ Movements: ____________ Start time: ____________ Finish time: ____________  Date: ____________ Movements: ____________ Start time: ____________ Finish time: ____________  Date: ____________ Movements:  ____________ Start time: ____________ Finish time: ____________  Date: ____________ Movements: ____________ Start time: ____________ Finish time: ____________  Date: ____________ Movements: ____________ Start time: ____________ Finish time: ____________  Date: ____________ Movements: ____________ Start time: ____________ Finish time: ____________  Date: ____________ Movements: ____________ Start time: ____________ Finish time: ____________   Date: ____________ Movements: ____________ Start time: ____________ Finish time: ____________  Date: ____________ Movements: ____________ Start time: ____________ Finish time: ____________  Date: ____________ Movements: ____________ Start time: ____________ Finish time: ____________  Date: ____________ Movements: ____________ Start time: ____________ Finish time: ____________  Date: ____________ Movements: ____________ Start time: ____________ Finish time: ____________  Date: ____________ Movements: ____________ Start time: ____________ Finish time: ____________  Date: ____________ Movements: ____________ Start time: ____________ Finish time: ____________   Date: ____________ Movements: ____________ Start time: ____________ Finish time: ____________  Date: ____________ Movements: ____________ Start time: ____________ Finish time: ____________  Date: ____________ Movements: ____________ Start time: ____________ Finish time: ____________  Date: ____________ Movements: ____________ Start time: ____________ Finish time: ____________  Date: ____________ Movements: ____________ Start time: ____________ Finish time: ____________  Date: ____________ Movements: ____________ Start time: ____________ Finish time: ____________  Date: ____________ Movements: ____________ Start time: ____________ Finish time: ____________   Date: ____________ Movements: ____________ Start time: ____________ Finish time: ____________  Date: ____________ Movements: ____________ Start time: ____________ Finish  time: ____________  Date: ____________ Movements: ____________ Start time: ____________ Finish time: ____________  Date: ____________ Movements: ____________ Start time:   ____________ Finish time: ____________  Date: ____________ Movements: ____________ Start time: ____________ Finish time: ____________  Date: ____________ Movements: ____________ Start time: ____________ Finish time: ____________  Date: ____________ Movements: ____________ Start time: ____________ Finish time: ____________   Date: ____________ Movements: ____________ Start time: ____________ Finish time: ____________  Date: ____________ Movements: ____________ Start time: ____________ Finish time: ____________  Date: ____________ Movements: ____________ Start time: ____________ Finish time: ____________  Date: ____________ Movements: ____________ Start time: ____________ Finish time: ____________  Date: ____________ Movements: ____________ Start time: ____________ Finish time: ____________  Date: ____________ Movements: ____________ Start time: ____________ Finish time: ____________  Date: ____________ Movements: ____________ Start time: ____________ Finish time: ____________   Date: ____________ Movements: ____________ Start time: ____________ Finish time: ____________  Date: ____________ Movements: ____________ Start time: ____________ Finish time: ____________  Date: ____________ Movements: ____________ Start time: ____________ Finish time: ____________  Date: ____________ Movements: ____________ Start time: ____________ Finish time: ____________  Date: ____________ Movements: ____________ Start time: ____________ Finish time: ____________  Date: ____________ Movements: ____________ Start time: ____________ Finish time: ____________  Date: ____________ Movements: ____________ Start time: ____________ Finish time: ____________   Date: ____________ Movements: ____________ Start time: ____________ Finish time: ____________  Date: ____________  Movements: ____________ Start time: ____________ Finish time: ____________  Date: ____________ Movements: ____________ Start time: ____________ Finish time: ____________  Date: ____________ Movements: ____________ Start time: ____________ Finish time: ____________  Date: ____________ Movements: ____________ Start time: ____________ Finish time: ____________  Date: ____________ Movements: ____________ Start time: ____________ Finish time: ____________  Date: ____________ Movements: ____________ Start time: ____________ Finish time: ____________   Date: ____________ Movements: ____________ Start time: ____________ Finish time: ____________  Date: ____________ Movements: ____________ Start time: ____________ Finish time: ____________  Date: ____________ Movements: ____________ Start time: ____________ Finish time: ____________  Date: ____________ Movements: ____________ Start time: ____________ Finish time: ____________  Date: ____________ Movements: ____________ Start time: ____________ Finish time: ____________  Date: ____________ Movements: ____________ Start time: ____________ Finish time: ____________     This information is not intended to replace advice given to you by your health care provider. Make sure you discuss any questions you have with your health care provider.     Document Released: 04/18/2006 Document Revised: 04/09/2014 Document Reviewed: 01/14/2012  [employer]vier Interactive Patient Education ©2016 [employer]

## 2015-06-21 ENCOUNTER — Ambulatory Visit (INDEPENDENT_AMBULATORY_CARE_PROVIDER_SITE_OTHER): Payer: Medicaid Other | Admitting: Obstetrics

## 2015-06-21 VITALS — BP 116/71 | HR 104 | Temp 98.7°F | Wt 333.0 lb

## 2015-06-21 DIAGNOSIS — O99213 Obesity complicating pregnancy, third trimester: Secondary | ICD-10-CM

## 2015-06-21 DIAGNOSIS — O09523 Supervision of elderly multigravida, third trimester: Secondary | ICD-10-CM

## 2015-06-21 DIAGNOSIS — Z3483 Encounter for supervision of other normal pregnancy, third trimester: Secondary | ICD-10-CM

## 2015-06-22 ENCOUNTER — Encounter: Payer: Self-pay | Admitting: Obstetrics

## 2015-06-22 NOTE — Progress Notes (Signed)
Subjective:    [name] is a 35 y.o. female being seen today for her obstetrical visit. She is at 6664w0d gestation. Patient reports no complaints. Fetal movement: normal.  Problem List Items Addressed This Visit    None     Patient Active Problem List   Diagnosis Date Noted  . Gastroenteritis 06/16/2015  . Advanced maternal age in multigravida 01/17/2015  . [redacted] weeks gestation of pregnancy   . Anemia 07/24/2014  . Morbid obesity (HCC) 07/22/2014  . Cellulitis of right lower extremity 07/22/2014  . Prediabetes 07/22/2014  . Fissure in skin of foot 07/22/2014  . GERD without esophagitis 08/28/2012   Objective:    BP 116/71 mmHg  Pulse 104  Temp(Src) 98.7 F (37.1 C)  Wt 333 lb (151.048 kg)  LMP 10/07/2014 FHT:  150 BPM  Uterine Size: size greater than dates  Presentation: unsure     Assessment:    Pregnancy @ 7964w0d weeks   Plan:     labs reviewed, problem list updated Consent signed. GBS sent TDAP offered  Rhogam given for RH negative Pediatrician: discussed. Infant feeding: plans to breastfeed. Maternity leave: discussed. Cigarette smoking: never smoked. No orders of the defined types were placed in this encounter.   No orders of the defined types were placed in this encounter.   Follow up in 1 Week.

## 2015-06-27 ENCOUNTER — Other Ambulatory Visit: Payer: Self-pay | Admitting: Certified Nurse Midwife

## 2015-06-30 ENCOUNTER — Ambulatory Visit (INDEPENDENT_AMBULATORY_CARE_PROVIDER_SITE_OTHER): Payer: Medicaid Other | Admitting: Obstetrics

## 2015-06-30 ENCOUNTER — Encounter: Payer: Self-pay | Admitting: Obstetrics

## 2015-06-30 VITALS — BP 111/76 | HR 103 | Temp 99.3°F | Wt 327.0 lb

## 2015-06-30 DIAGNOSIS — O09523 Supervision of elderly multigravida, third trimester: Secondary | ICD-10-CM

## 2015-06-30 DIAGNOSIS — G4700 Insomnia, unspecified: Secondary | ICD-10-CM

## 2015-06-30 DIAGNOSIS — M549 Dorsalgia, unspecified: Secondary | ICD-10-CM

## 2015-06-30 DIAGNOSIS — O99213 Obesity complicating pregnancy, third trimester: Secondary | ICD-10-CM

## 2015-06-30 LAB — POCT URINALYSIS DIPSTICK
BILIRUBIN UA: NEGATIVE
Blood, UA: NEGATIVE
Glucose, UA: NEGATIVE
LEUKOCYTES UA: NEGATIVE
Nitrite, UA: NEGATIVE
Protein, UA: NEGATIVE
Spec Grav, UA: 1.01
Urobilinogen, UA: NEGATIVE
pH, UA: 6.5

## 2015-06-30 MED ORDER — ZOLPIDEM TARTRATE 5 MG PO TABS: 5.0000 mg | ORAL_TABLET | Freq: Every evening | ORAL | Status: DC | PRN

## 2015-06-30 MED ORDER — TRAMADOL HCL 50 MG PO TABS: 50.0000 mg | ORAL_TABLET | Freq: Four times a day (QID) | ORAL | Status: DC | PRN

## 2015-06-30 MED ORDER — CYCLOBENZAPRINE HCL 10 MG PO TABS: 10.0000 mg | ORAL_TABLET | Freq: Three times a day (TID) | ORAL | Status: DC | PRN

## 2015-06-30 NOTE — Addendum Note (Signed)
Addended by: Henriette CombsHATTON, Lilibeth Opie L on: 06/30/2015 01:22 PM   Modules accepted: Orders

## 2015-06-30 NOTE — Progress Notes (Signed)
Subjective:    Ronny FlurryLalita N Holck is a 35 y.o. female being seen today for her obstetrical visit. She is at 4926w1d gestation. Patient reports backache and difficulty sleeping. Fetal movement: normal.  Problem List Items Addressed This Visit    None    Visit Diagnoses    Backache with radiation    -  Primary    Relevant Medications    cyclobenzaprine (FLEXERIL) 10 MG tablet    Insomnia        Relevant Medications    zolpidem (AMBIEN) 5 MG tablet      Patient Active Problem List   Diagnosis Date Noted  . Gastroenteritis 06/16/2015  . Advanced maternal age in multigravida 01/17/2015  . [redacted] weeks gestation of pregnancy   . Anemia 07/24/2014  . Morbid obesity (HCC) 07/22/2014  . Cellulitis of right lower extremity 07/22/2014  . Prediabetes 07/22/2014  . Fissure in skin of foot 07/22/2014  . GERD without esophagitis 08/28/2012   Objective:    BP 111/76 mmHg  Pulse 103  Temp(Src) 99.3 F (37.4 C)  Wt 327 lb (148.326 kg)  LMP 10/07/2014 FHT:  150 BPM  Uterine Size: size greater than dates  Presentation: unsure     Assessment:    Pregnancy @ 2626w1d weeks   Plan:     labs reviewed, problem list updated Consent signed. GBS sent TDAP offered  Rhogam given for RH negative Pediatrician: discussed. Infant feeding: plans to breastfeed. Maternity leave: discussed. Cigarette smoking: never smoked. No orders of the defined types were placed in this encounter.   Meds ordered this encounter  Medications  . cyclobenzaprine (FLEXERIL) 10 MG tablet    Sig: Take 1 tablet (10 mg total) by mouth every 8 (eight) hours as needed for muscle spasms.    Dispense:  30 tablet    Refill:  1  . DISCONTD: traMADol (ULTRAM) 50 MG tablet    Sig: Take 1-2 tablets (50-100 mg total) by mouth every 6 (six) hours as needed for moderate pain or severe pain.    Dispense:  40 tablet    Refill:  2  . zolpidem (AMBIEN) 5 MG tablet    Sig: Take 1 tablet (5 mg total) by mouth at bedtime as needed for  sleep.    Dispense:  30 tablet    Refill:  2   Follow up in 1 Week.

## 2015-07-07 ENCOUNTER — Ambulatory Visit (INDEPENDENT_AMBULATORY_CARE_PROVIDER_SITE_OTHER): Payer: Medicaid Other | Admitting: Obstetrics

## 2015-07-07 ENCOUNTER — Encounter: Payer: Self-pay | Admitting: Obstetrics

## 2015-07-07 VITALS — BP 90/69 | HR 99 | Wt 330.0 lb

## 2015-07-07 DIAGNOSIS — Z3483 Encounter for supervision of other normal pregnancy, third trimester: Secondary | ICD-10-CM

## 2015-07-07 LAB — POCT URINALYSIS DIPSTICK
Bilirubin, UA: NEGATIVE
Blood, UA: NEGATIVE
Glucose, UA: NEGATIVE
KETONES UA: NEGATIVE
LEUKOCYTES UA: NEGATIVE
NITRITE UA: NEGATIVE
PH UA: 7.5
Spec Grav, UA: 1.01
UROBILINOGEN UA: NEGATIVE

## 2015-07-07 NOTE — Progress Notes (Signed)
Subjective:    Christina FlurryLalita N Strickland is a 35 y.o. female being seen today for her obstetrical visit. She is at 6031w1d gestation. Patient reports no complaints. Fetal movement: normal.  Problem List Items Addressed This Visit    None    Visit Diagnoses    Encounter for supervision of other normal pregnancy in third trimester    -  Primary    Relevant Orders    POCT urinalysis dipstick (Completed)      Patient Active Problem List   Diagnosis Date Noted  . Gastroenteritis 06/16/2015  . Advanced maternal age in multigravida 01/17/2015  . [redacted] weeks gestation of pregnancy   . Anemia 07/24/2014  . Morbid obesity (HCC) 07/22/2014  . Cellulitis of right lower extremity 07/22/2014  . Prediabetes 07/22/2014  . Fissure in skin of foot 07/22/2014  . GERD without esophagitis 08/28/2012    Objective:    BP 90/69 mmHg  Pulse 99  Wt 330 lb (149.687 kg)  LMP 10/07/2014 FHT: 150 BPM  Uterine Size: size greater than dates  Presentations: unsure    Assessment:    Pregnancy @ 6231w1d weeks   Plan:   Plans for delivery: C/Section scheduled; labs reviewed; problem list updated Counseling: Consent signed. Infant feeding: plans to breastfeed. Cigarette smoking: never smoked. L&D discussion: symptoms of labor, discussed when to call, discussed what number to call, anesthetic/analgesic options reviewed and delivering clinician:  plans Physician. Postpartum supports and preparation: circumcision discussed and contraception plans discussed.  Follow up in 1 Week.

## 2015-07-09 ENCOUNTER — Encounter (HOSPITAL_COMMUNITY): Payer: Self-pay | Admitting: *Deleted

## 2015-07-09 ENCOUNTER — Inpatient Hospital Stay (HOSPITAL_COMMUNITY)
Admission: AD | Admit: 2015-07-09 | Discharge: 2015-07-10 | Disposition: A | Discharge status: Home / Self Care | Payer: Medicaid Other | Source: Ambulatory Visit | Attending: Obstetrics | Admitting: Obstetrics

## 2015-07-09 DIAGNOSIS — Z3A00 Weeks of gestation of pregnancy not specified: Secondary | ICD-10-CM | POA: Insufficient documentation

## 2015-07-09 DIAGNOSIS — O368130 Decreased fetal movements, third trimester, not applicable or unspecified: Secondary | ICD-10-CM | POA: Insufficient documentation

## 2015-07-09 DIAGNOSIS — O479 False labor, unspecified: Secondary | ICD-10-CM | POA: Diagnosis not present

## 2015-07-09 DIAGNOSIS — Z79899 Other long term (current) drug therapy: Secondary | ICD-10-CM | POA: Diagnosis not present

## 2015-07-09 DIAGNOSIS — Z6841 Body Mass Index (BMI) 40.0 and over, adult: Secondary | ICD-10-CM | POA: Diagnosis not present

## 2015-07-09 NOTE — MAU Note (Signed)
Some abd pain. Decreased FM today. Saw some blood tonight when i wiped

## 2015-07-10 DIAGNOSIS — Z3A37 37 weeks gestation of pregnancy: Secondary | ICD-10-CM | POA: Diagnosis not present

## 2015-07-10 DIAGNOSIS — O368130 Decreased fetal movements, third trimester, not applicable or unspecified: Secondary | ICD-10-CM | POA: Diagnosis not present

## 2015-07-10 DIAGNOSIS — O471 False labor at or after 37 completed weeks of gestation: Secondary | ICD-10-CM

## 2015-07-10 NOTE — Discharge Instructions (Signed)
Braxton Hicks Contractions °Contractions of the uterus can occur throughout pregnancy. Contractions are not always a sign that you are in labor.  °WHAT ARE BRAXTON HICKS CONTRACTIONS?  °Contractions that occur before labor are called Braxton Hicks contractions, or false labor. Toward the end of pregnancy (32-34 weeks), these contractions can develop more often and may become more forceful. This is not true labor because these contractions do not result in opening (dilatation) and thinning of the cervix. They are sometimes difficult to tell apart from true labor because these contractions can be forceful and people have different pain tolerances. You should not feel embarrassed if you go to the hospital with false labor. Sometimes, the only way to tell if you are in true labor is for your health care provider to look for changes in the cervix. °If there are no prenatal problems or other health problems associated with the pregnancy, it is completely safe to be sent home with false labor and await the onset of true labor. °HOW CAN YOU TELL THE DIFFERENCE BETWEEN TRUE AND FALSE LABOR? °False Labor °· The contractions of false labor are usually shorter and not as hard as those of true labor.   °· The contractions are usually irregular.   °· The contractions are often felt in the front of the lower abdomen and in the groin.   °· The contractions may go away when you walk around or change positions while lying down.   °· The contractions get weaker and are shorter lasting as time goes on.   °· The contractions do not usually become progressively stronger, regular, and closer together as with true labor.   °True Labor °1. Contractions in true labor last 30-70 seconds, become very regular, usually become more intense, and increase in frequency.   °2. The contractions do not go away with walking.   °3. The discomfort is usually felt in the top of the uterus and spreads to the lower abdomen and low back.   °4. True labor can  be determined by your health care provider with an exam. This will show that the cervix is dilating and getting thinner.   °WHAT TO REMEMBER °· Keep up with your usual exercises and follow other instructions given by your health care provider.   °· Take medicines as directed by your health care provider.   °· Keep your regular prenatal appointments.   °· Eat and drink lightly if you think you are going into labor.   °· If Braxton Hicks contractions are making you uncomfortable:   °· Change your position from lying down or resting to walking, or from walking to resting.   °· Sit and rest in a tub of warm water.   °· Drink 2-3 glasses of water. Dehydration may cause these contractions.   °· Do slow and deep breathing several times an hour.   °WHEN SHOULD I SEEK IMMEDIATE MEDICAL CARE? °Seek immediate medical care if: °· Your contractions become stronger, more regular, and closer together.   °· You have fluid leaking or gushing from your vagina.   °· You have a fever.   °· You pass blood-tinged mucus.   °· You have vaginal bleeding.   °· You have continuous abdominal pain.   °· You have low back pain that you never had before.   °· You feel your baby's head pushing down and causing pelvic pressure.   °· Your baby is not moving as much as it used to.   °  °This information is not intended to replace advice given to you by your health care provider. Make sure you discuss any questions you have with your health care   provider. °  °Document Released: 03/19/2005 Document Revised: 03/24/2013 Document Reviewed: 12/29/2012 °[employer] Interactive Patient Education ©2016 [employer] ° °Fetal Movement Counts °Patient Name: __________________________________________________ Patient Due Date: ____________________ °Performing a fetal movement count is highly recommended in high-risk pregnancies, but it is good for every pregnant woman to do. Your health care provider may ask you to start counting fetal movements at 28 weeks of the  pregnancy. Fetal movements often increase: °· After eating a full meal. °· After physical activity. °· After eating or drinking something sweet or cold. °· At rest. °Pay attention to when you feel the baby is most active. This will help you notice a pattern of your baby's sleep and wake cycles and what factors contribute to an increase in fetal movement. It is important to perform a fetal movement count at the same time each day when your baby is normally most active.  °HOW TO COUNT FETAL MOVEMENTS °5. Find a quiet and comfortable area to sit or lie down on your left side. Lying on your left side provides the best blood and oxygen circulation to your baby. °6. Write down the day and time on a sheet of paper or in a journal. °7. Start counting kicks, flutters, swishes, rolls, or jabs in a 2-hour period. You should feel at least 10 movements within 2 hours. °8. If you do not feel 10 movements in 2 hours, wait 2-3 hours and count again. Look for a change in the pattern or not enough counts in 2 hours. °SEEK MEDICAL CARE IF: °· You feel less than 10 counts in 2 hours, tried twice. °· There is no movement in over an hour. °· The pattern is changing or taking longer each day to reach 10 counts in 2 hours. °· You feel the baby is not moving as he or she usually does. °Date: ____________ Movements: ____________ Start time: ____________ Finish time: ____________  °Date: ____________ Movements: ____________ Start time: ____________ Finish time: ____________ °Date: ____________ Movements: ____________ Start time: ____________ Finish time: ____________ °Date: ____________ Movements: ____________ Start time: ____________ Finish time: ____________ °Date: ____________ Movements: ____________ Start time: ____________ Finish time: ____________ °Date: ____________ Movements: ____________ Start time: ____________ Finish time: ____________ °Date: ____________ Movements: ____________ Start time: ____________ Finish time:  ____________ °Date: ____________ Movements: ____________ Start time: ____________ Finish time: ____________  °Date: ____________ Movements: ____________ Start time: ____________ Finish time: ____________ °Date: ____________ Movements: ____________ Start time: ____________ Finish time: ____________ °Date: ____________ Movements: ____________ Start time: ____________ Finish time: ____________ °Date: ____________ Movements: ____________ Start time: ____________ Finish time: ____________ °Date: ____________ Movements: ____________ Start time: ____________ Finish time: ____________ °Date: ____________ Movements: ____________ Start time: ____________ Finish time: ____________ °Date: ____________ Movements: ____________ Start time: ____________ Finish time: ____________  °Date: ____________ Movements: ____________ Start time: ____________ Finish time: ____________ °Date: ____________ Movements: ____________ Start time: ____________ Finish time: ____________ °Date: ____________ Movements: ____________ Start time: ____________ Finish time: ____________ °Date: ____________ Movements: ____________ Start time: ____________ Finish time: ____________ °Date: ____________ Movements: ____________ Start time: ____________ Finish time: ____________ °Date: ____________ Movements: ____________ Start time: ____________ Finish time: ____________ °Date: ____________ Movements: ____________ Start time: ____________ Finish time: ____________  °Date: ____________ Movements: ____________ Start time: ____________ Finish time: ____________ °Date: ____________ Movements: ____________ Start time: ____________ Finish time: ____________ °Date: ____________ Movements: ____________ Start time: ____________ Finish time: ____________ °Date: ____________ Movements: ____________ Start time: ____________ Finish time: ____________ °Date: ____________ Movements: ____________ Start time: ____________ Finish time: ____________ °Date: ____________ Movements:  ____________ Start time: ____________ Finish   time: ____________ °Date: ____________ Movements: ____________ Start time: ____________ Finish time: ____________  °Date: ____________ Movements: ____________ Start time: ____________ Finish time: ____________ °Date: ____________ Movements: ____________ Start time: ____________ Finish time: ____________ °Date: ____________ Movements: ____________ Start time: ____________ Finish time: ____________ °Date: ____________ Movements: ____________ Start time: ____________ Finish time: ____________ °Date: ____________ Movements: ____________ Start time: ____________ Finish time: ____________ °Date: ____________ Movements: ____________ Start time: ____________ Finish time: ____________ °Date: ____________ Movements: ____________ Start time: ____________ Finish time: ____________  °Date: ____________ Movements: ____________ Start time: ____________ Finish time: ____________ °Date: ____________ Movements: ____________ Start time: ____________ Finish time: ____________ °Date: ____________ Movements: ____________ Start time: ____________ Finish time: ____________ °Date: ____________ Movements: ____________ Start time: ____________ Finish time: ____________ °Date: ____________ Movements: ____________ Start time: ____________ Finish time: ____________ °Date: ____________ Movements: ____________ Start time: ____________ Finish time: ____________ °Date: ____________ Movements: ____________ Start time: ____________ Finish time: ____________  °Date: ____________ Movements: ____________ Start time: ____________ Finish time: ____________ °Date: ____________ Movements: ____________ Start time: ____________ Finish time: ____________ °Date: ____________ Movements: ____________ Start time: ____________ Finish time: ____________ °Date: ____________ Movements: ____________ Start time: ____________ Finish time: ____________ °Date: ____________ Movements: ____________ Start time: ____________ Finish  time: ____________ °Date: ____________ Movements: ____________ Start time: ____________ Finish time: ____________ °Date: ____________ Movements: ____________ Start time: ____________ Finish time: ____________  °Date: ____________ Movements: ____________ Start time: ____________ Finish time: ____________ °Date: ____________ Movements: ____________ Start time: ____________ Finish time: ____________ °Date: ____________ Movements: ____________ Start time: ____________ Finish time: ____________ °Date: ____________ Movements: ____________ Start time: ____________ Finish time: ____________ °Date: ____________ Movements: ____________ Start time: ____________ Finish time: ____________ °Date: ____________ Movements: ____________ Start time: ____________ Finish time: ____________ °  °This information is not intended to replace advice given to you by your health care provider. Make sure you discuss any questions you have with your health care provider. °  °Document Released: 04/18/2006 Document Revised: 04/09/2014 Document Reviewed: 01/14/2012 °[employer] Interactive Patient Education ©2016 [employer] ° °

## 2015-07-10 NOTE — MAU Provider Note (Signed)
History   [id number]   Chief Complaint  Patient presents with  . Decreased Fetal Movement  . Vaginal Bleeding  . Abdominal Pain    HPI [name] is a 35 y.o. female  [phone] here with report of decreased fetal movement since Friday. Reports feeling the baby move approximately several times in the past 24 hour, just not as much as normal. Reports seeing bloody mucous on the towel after getting out of the shower this evening. Reports some painful contractions today about every 30 minutes. Denies LOF. Denies recent intercourse.    Patient's last menstrual period was 10/07/2014.  OB History  Gravida Para Term Preterm AB SAB TAB Ectopic Multiple Living  5 3 3  1 1    3     # Outcome Date GA Lbr Len/2nd Weight Sex Delivery Anes PTL Lv  5 Current           4 Term 12/24/08    [name] CS-LTranv   Y  3 SAB 04/02/06          2 Term 02/11/97    F Vag-Spont   Y  1 Term 12/29/92    [name] CS-LTranv   Y      Past Medical History  Diagnosis Date  . Arthritis     "back; right foot; left ankle" (07/22/2014)  . Chronic lower back pain   . Cellulitis of leg, right 07/2014  . Morbid obesity with BMI of 50.0-59.9, adult (HCC)     Family History  Problem Relation Age of Onset  . Hypertension Mother   . Diabetes Maternal Grandfather   . Diabetes Father     Social History   Social History  . Marital Status: Single    Spouse Name: N/A  . Number of Children: N/A  . Years of Education: N/A   Social History Main Topics  . Smoking status: Never Smoker   . Smokeless tobacco: Never Used  . Alcohol Use: No  . Drug Use: No  . Sexual Activity: Yes   Other Topics Concern  . None   Social History Narrative    Allergies  Allergen Reactions  . Penicillins Hives and Other (See Comments)    Has patient had a PCN reaction causing immediate rash, facial/tongue/throat swelling, SOB or lightheadedness with hypotension: No Has patient had a PCN reaction causing severe rash involving mucus membranes  or skin necrosis: No Has patient had a PCN reaction that required hospitalization No Has patient had a PCN reaction occurring within the last 10 years: No If all of the above answers are "NO", then may proceed with Cephalosporin use.  . Tramadol Hcl Other (See Comments)    Unknown allergic reaction    No current facility-administered medications on file prior to encounter.   Current Outpatient Prescriptions on File Prior to Encounter  Medication Sig Dispense Refill  . docusate sodium (COLACE) 100 MG capsule Take 1 capsule (100 mg total) by mouth 2 (two) times daily. (Patient not taking: Reported on 06/21/2015) 100 capsule prn  . esomeprazole (NEXIUM) 20 MG capsule Take 1 capsule (20 mg total) by mouth 2 (two) times daily before a meal. (Patient not taking: Reported on 06/21/2015) 60 capsule 11  . polyethylene glycol (MIRALAX / GLYCOLAX) packet Take 17 g by mouth daily as needed for moderate constipation. (Patient not taking: Reported on 06/16/2015) 14 each 1  . Prenat-FeCbn-FeAspGl-FA-Omega (OB COMPLETE PETITE) 35-5-1-200 MG CAPS Take 1 capsule by mouth daily before breakfast. 90 capsule 3  Review of Systems  Constitutional: Negative.   Gastrointestinal: Positive for abdominal pain.  Genitourinary: Positive for vaginal bleeding.     Physical Exam   Filed Vitals:   07/09/15 2334 07/09/15 2336 07/09/15 2353  BP:  118/76 123/72  Pulse:  97 110  Temp: 98.4 F (36.9 C)  98.2 F (36.8 C)  TempSrc:   Oral  Resp: 20  20  Height:  (1.6 m)    Weight: 334 lb 9.6 oz (151.774 kg)      Physical Exam  Constitutional: She appears well-developed and well-nourished. No distress.  HENT:  Head: Normocephalic and atraumatic.  Eyes: Conjunctivae are normal. No scleral icterus.  Respiratory: Effort normal. No respiratory distress.  GI: Soft. There is no tenderness.  Skin: Skin is warm and dry. She is not diaphoretic.  Psychiatric: She has a normal mood and affect. Her behavior is  normal. Judgment and thought content normal.   Dilation: 1 Effacement (%): Thick Cervical Position: Anterior Station: -3 Presentation: Vertex Exam by:: Christina Confer, RN  Fetal Tracing:  Baseline: 140 Variability: moderate Accelerations: 15x15 Decelerations: none  Toco: none  MAU Course  Procedures No results found for this or any previous visit (from the past 24 hour(s)).  MDM Reactive tracing Cervix unchanged while in MAU No blood on exam Assessment and Plan  A: 1. Braxton Hicks contractions   2. Decreased fetal movement, third trimester, not applicable or unspecified fetus     P; Discharge home Fetal kick count form Discussed reasons to return Keep f/u with ob   Judeth Horn, NP 07/10/2015 12:18 AM

## 2015-07-13 ENCOUNTER — Other Ambulatory Visit: Payer: Self-pay | Admitting: *Deleted

## 2015-07-13 ENCOUNTER — Encounter: Payer: Medicaid Other | Admitting: Obstetrics

## 2015-07-14 ENCOUNTER — Ambulatory Visit (INDEPENDENT_AMBULATORY_CARE_PROVIDER_SITE_OTHER): Payer: Medicaid Other | Admitting: Obstetrics

## 2015-07-14 ENCOUNTER — Telehealth (HOSPITAL_COMMUNITY): Payer: Self-pay | Admitting: *Deleted

## 2015-07-14 ENCOUNTER — Encounter: Payer: Self-pay | Admitting: Obstetrics

## 2015-07-14 ENCOUNTER — Encounter (HOSPITAL_COMMUNITY): Payer: Self-pay | Admitting: *Deleted

## 2015-07-14 VITALS — BP 131/79 | HR 99 | Temp 98.3°F | Wt 328.0 lb

## 2015-07-14 DIAGNOSIS — Z3483 Encounter for supervision of other normal pregnancy, third trimester: Secondary | ICD-10-CM

## 2015-07-14 DIAGNOSIS — O09523 Supervision of elderly multigravida, third trimester: Secondary | ICD-10-CM

## 2015-07-14 DIAGNOSIS — O99213 Obesity complicating pregnancy, third trimester: Secondary | ICD-10-CM

## 2015-07-14 NOTE — Progress Notes (Signed)
Patient has no concerns 

## 2015-07-14 NOTE — Progress Notes (Signed)
Subjective:    [name] is a 35 y.o. female being seen today for her obstetrical visit. She is at 1876w1d gestation. Patient reports backache. Fetal movement: normal.  Problem List Items Addressed This Visit    None     Patient Active Problem List   Diagnosis Date Noted  . Gastroenteritis 06/16/2015  . Advanced maternal age in multigravida 01/17/2015  . [redacted] weeks gestation of pregnancy   . Anemia 07/24/2014  . Morbid obesity (HCC) 07/22/2014  . Cellulitis of right lower extremity 07/22/2014  . Prediabetes 07/22/2014  . Fissure in skin of foot 07/22/2014  . GERD without esophagitis 08/28/2012    Objective:    BP 131/79 mmHg  Pulse 99  Temp(Src) 98.3 F (36.8 C)  Wt 328 lb (148.78 kg)  LMP 10/07/2014 FHT: 150 BPM  Uterine Size: size greater than dates  Presentations: cephalic  Pelvic Exam:              Dilation: 1cm       Effacement: 50%             Station:  -3    Consistency: soft            Position: posterior     Assessment:    Pregnancy @ 276w1d weeks   Plan:   Plans for delivery: repeat C/S scheduled next Wedsday; labs reviewed; problem list updated Counseling: Consent signed. Infant feeding: plans to breastfeed. Cigarette smoking: never smoked. L&D discussion: symptoms of labor, discussed when to call, discussed what number to call, anesthetic/analgesic options reviewed and delivering clinician:  plans Physician. Postpartum supports and preparation: circumcision discussed and contraception plans discussed.  Follow up in 1 Week.

## 2015-07-14 NOTE — Telephone Encounter (Signed)
Preadmission screen  

## 2015-07-18 NOTE — Patient Instructions (Signed)
20 Ronny FlurryLalita N Saling  07/18/2015   Your procedure is scheduled on:  07/20/2015  [facility] AM.  Pick up the phone at the desk and dial [phone].   Call this number if you have problems the morning of surgery: [phone]   Remember:   Do not eat food:After Midnight.  Do not drink clear liquids: After Midnight.  Take these medicines the morning of surgery with A SIP OF WATER: none   Do not wear jewelry, make-up or nail polish.  Do not wear lotions, powders, or perfumes. You may wear deodorant.  Do not shave 48 hours prior to surgery.  Do not bring valuables to the hospital.  [facility] is not   responsible for any belongings or valuables brought to the hospital.  Contacts, dentures or bridgework may not be worn into surgery.  Leave suitcase in the car. After surgery it may be brought to your room.  For patients admitted to the hospital, checkout time is 11:00 AM the day of              discharge.   Patients discharged the day of surgery will not be allowed to drive             home.  Name and phone number of your driver: na  Special Instructions:   Shower using CHG 2 nights before surgery and the night before surgery.  If you shower the day of surgery use CHG.  Use special wash - you have one bottle of CHG for all showers.  You should use approximately 1/3 of the bottle for each shower.   Please read over the following fact sheets that you were given:   Surgical Site Infection Prevention

## 2015-07-19 ENCOUNTER — Encounter (HOSPITAL_COMMUNITY)
Admission: RE | Admit: 2015-07-19 | Discharge: 2015-07-19 | Disposition: A | Discharge status: Home / Self Care | Payer: Medicaid Other | Source: Ambulatory Visit | Attending: Obstetrics | Admitting: Obstetrics

## 2015-07-19 LAB — CBC
HEMATOCRIT: 39.1 % (ref 36.0–46.0)
HEMOGLOBIN: 13.1 g/dL (ref 12.0–15.0)
MCH: 27.8 pg (ref 26.0–34.0)
MCHC: 33.5 g/dL (ref 30.0–36.0)
MCV: 82.8 fL (ref 78.0–100.0)
Platelets: 157 10*3/uL (ref 150–400)
RBC: 4.72 MIL/uL (ref 3.87–5.11)
RDW: 14.1 % (ref 11.5–15.5)
WBC: 8.3 10*3/uL (ref 4.0–10.5)

## 2015-07-19 MED ORDER — DEXTROSE 5 % IV SOLN
INTRAVENOUS | Status: AC
  Administered 2015-07-20: 117 mL via INTRAVENOUS
  Filled 2015-07-19: qty 11.5

## 2015-07-20 ENCOUNTER — Inpatient Hospital Stay (HOSPITAL_COMMUNITY): Payer: Medicaid Other | Admitting: Anesthesiology

## 2015-07-20 ENCOUNTER — Encounter (HOSPITAL_COMMUNITY)
Admission: RE | Disposition: A | Discharge status: Home / Self Care | Payer: Self-pay | Source: Ambulatory Visit | Attending: Obstetrics

## 2015-07-20 ENCOUNTER — Inpatient Hospital Stay (HOSPITAL_COMMUNITY)
Admission: RE | Admit: 2015-07-20 | Discharge: 2015-07-23 | DRG: 765 | Disposition: A | Discharge status: Home / Self Care | Payer: Medicaid Other | Source: Ambulatory Visit | Attending: Obstetrics | Admitting: Obstetrics

## 2015-07-20 ENCOUNTER — Encounter (HOSPITAL_COMMUNITY): Payer: Self-pay | Admitting: Anesthesiology

## 2015-07-20 DIAGNOSIS — Z3A39 39 weeks gestation of pregnancy: Secondary | ICD-10-CM | POA: Diagnosis not present

## 2015-07-20 DIAGNOSIS — Z302 Encounter for sterilization: Secondary | ICD-10-CM

## 2015-07-20 DIAGNOSIS — Z98891 History of uterine scar from previous surgery: Secondary | ICD-10-CM

## 2015-07-20 DIAGNOSIS — Z833 Family history of diabetes mellitus: Secondary | ICD-10-CM | POA: Diagnosis not present

## 2015-07-20 DIAGNOSIS — O9962 Diseases of the digestive system complicating childbirth: Secondary | ICD-10-CM | POA: Diagnosis present

## 2015-07-20 DIAGNOSIS — O99214 Obesity complicating childbirth: Secondary | ICD-10-CM | POA: Diagnosis not present

## 2015-07-20 DIAGNOSIS — Z8249 Family history of ischemic heart disease and other diseases of the circulatory system: Secondary | ICD-10-CM | POA: Diagnosis not present

## 2015-07-20 DIAGNOSIS — Z6841 Body Mass Index (BMI) 40.0 and over, adult: Secondary | ICD-10-CM

## 2015-07-20 DIAGNOSIS — O34211 Maternal care for low transverse scar from previous cesarean delivery: Principal | ICD-10-CM | POA: Diagnosis present

## 2015-07-20 DIAGNOSIS — K219 Gastro-esophageal reflux disease without esophagitis: Secondary | ICD-10-CM | POA: Diagnosis present

## 2015-07-20 HISTORY — DX: History of uterine scar from previous surgery: Z98.891

## 2015-07-20 LAB — RPR: RPR Ser Ql: NONREACTIVE

## 2015-07-20 LAB — PREPARE RBC (CROSSMATCH)

## 2015-07-20 SURGERY — Surgical Case
Anesthesia: Spinal

## 2015-07-20 MED ORDER — ONDANSETRON HCL 4 MG/2ML IJ SOLN: 4.0000 mg | Freq: Three times a day (TID) | INTRAMUSCULAR | Status: DC | PRN

## 2015-07-20 MED ORDER — LACTATED RINGERS IV SOLN
INTRAVENOUS | Status: DC | PRN
  Administered 2015-07-20: 13:00:00 via INTRAVENOUS

## 2015-07-20 MED ORDER — WITCH HAZEL-GLYCERIN EX PADS: 1.0000 "application " | MEDICATED_PAD | CUTANEOUS | Status: DC | PRN

## 2015-07-20 MED ORDER — DIPHENHYDRAMINE HCL 25 MG PO CAPS
25.0000 mg | ORAL_CAPSULE | ORAL | Status: DC | PRN
  Filled 2015-07-20: qty 1

## 2015-07-20 MED ORDER — MORPHINE SULFATE (PF) 0.5 MG/ML IJ SOLN
INTRAMUSCULAR | Status: DC | PRN
  Administered 2015-07-20: .2 mg via INTRATHECAL

## 2015-07-20 MED ORDER — NALOXONE HCL 0.4 MG/ML IJ SOLN: 0.4000 mg | INTRAMUSCULAR | Status: DC | PRN

## 2015-07-20 MED ORDER — KETOROLAC TROMETHAMINE 30 MG/ML IJ SOLN
INTRAMUSCULAR | Status: AC
  Filled 2015-07-20: qty 1

## 2015-07-20 MED ORDER — NALBUPHINE HCL 10 MG/ML IJ SOLN
INTRAMUSCULAR | Status: AC
  Filled 2015-07-20: qty 1

## 2015-07-20 MED ORDER — NALBUPHINE HCL 10 MG/ML IJ SOLN: 5.0000 mg | INTRAMUSCULAR | Status: DC | PRN

## 2015-07-20 MED ORDER — LACTATED RINGERS IV SOLN
INTRAVENOUS | Status: DC
  Administered 2015-07-20 (×2): via INTRAVENOUS

## 2015-07-20 MED ORDER — IBUPROFEN 600 MG PO TABS: 600.0000 mg | ORAL_TABLET | Freq: Four times a day (QID) | ORAL | Status: DC | PRN

## 2015-07-20 MED ORDER — PRENATAL MULTIVITAMIN CH
1.0000 | ORAL_TABLET | Freq: Every day | ORAL | Status: DC
  Administered 2015-07-21 – 2015-07-22 (×2): 1 via ORAL
  Filled 2015-07-20 (×2): qty 1

## 2015-07-20 MED ORDER — LACTATED RINGERS IV SOLN
Freq: Once | INTRAVENOUS | Status: AC
  Administered 2015-07-20: 12:00:00 via INTRAVENOUS

## 2015-07-20 MED ORDER — COCONUT OIL OIL: 1.0000 "application " | TOPICAL_OIL | Status: DC | PRN

## 2015-07-20 MED ORDER — SIMETHICONE 80 MG PO CHEW
80.0000 mg | CHEWABLE_TABLET | ORAL | Status: DC
  Administered 2015-07-21 – 2015-07-22 (×3): 80 mg via ORAL
  Filled 2015-07-20 (×3): qty 1

## 2015-07-20 MED ORDER — SENNOSIDES-DOCUSATE SODIUM 8.6-50 MG PO TABS
2.0000 | ORAL_TABLET | ORAL | Status: DC
Start: 2015-07-21 — End: 2015-07-23
  Administered 2015-07-21 – 2015-07-22 (×3): 2 via ORAL
  Filled 2015-07-20 (×3): qty 2

## 2015-07-20 MED ORDER — PHENYLEPHRINE 8 MG IN D5W 100 ML (0.08MG/ML) PREMIX OPTIME
INJECTION | INTRAVENOUS | Status: DC | PRN
  Administered 2015-07-20: 60 ug/min via INTRAVENOUS

## 2015-07-20 MED ORDER — FENTANYL CITRATE (PF) 100 MCG/2ML IJ SOLN
25.0000 ug | INTRAMUSCULAR | Status: DC | PRN
Start: 2015-07-20 — End: 2015-07-20

## 2015-07-20 MED ORDER — ACETAMINOPHEN 325 MG PO TABS
650.0000 mg | ORAL_TABLET | ORAL | Status: DC | PRN
  Administered 2015-07-20: 650 mg via ORAL
  Filled 2015-07-20: qty 2

## 2015-07-20 MED ORDER — SCOPOLAMINE 1 MG/3DAYS TD PT72
MEDICATED_PATCH | TRANSDERMAL | Status: AC
  Administered 2015-07-20: 1.5 mg via TRANSDERMAL
  Filled 2015-07-20: qty 1

## 2015-07-20 MED ORDER — ZOLPIDEM TARTRATE 5 MG PO TABS: 5.0000 mg | ORAL_TABLET | Freq: Every evening | ORAL | Status: DC | PRN

## 2015-07-20 MED ORDER — KETOROLAC TROMETHAMINE 30 MG/ML IJ SOLN
30.0000 mg | Freq: Four times a day (QID) | INTRAMUSCULAR | Status: DC | PRN
  Administered 2015-07-20: 30 mg via INTRAMUSCULAR

## 2015-07-20 MED ORDER — OXYCODONE-ACETAMINOPHEN 5-325 MG PO TABS
2.0000 | ORAL_TABLET | ORAL | Status: DC | PRN
  Administered 2015-07-21 – 2015-07-23 (×6): 2 via ORAL
  Filled 2015-07-20 (×6): qty 2

## 2015-07-20 MED ORDER — SODIUM CHLORIDE 0.9 % IR SOLN
Status: DC | PRN
Start: 2015-07-20 — End: 2015-07-20
  Administered 2015-07-20: 1

## 2015-07-20 MED ORDER — ONDANSETRON HCL 4 MG/2ML IJ SOLN
INTRAMUSCULAR | Status: AC
  Filled 2015-07-20: qty 2

## 2015-07-20 MED ORDER — IBUPROFEN 600 MG PO TABS
600.0000 mg | ORAL_TABLET | Freq: Four times a day (QID) | ORAL | Status: DC
  Administered 2015-07-21 – 2015-07-23 (×10): 600 mg via ORAL
  Filled 2015-07-20 (×10): qty 1

## 2015-07-20 MED ORDER — NALOXONE HCL 2 MG/2ML IJ SOSY
1.0000 ug/kg/h | PREFILLED_SYRINGE | INTRAVENOUS | Status: DC | PRN
  Filled 2015-07-20: qty 2

## 2015-07-20 MED ORDER — DIBUCAINE 1 % RE OINT: 1.0000 "application " | TOPICAL_OINTMENT | RECTAL | Status: DC | PRN

## 2015-07-20 MED ORDER — KETOROLAC TROMETHAMINE 30 MG/ML IJ SOLN: 30.0000 mg | Freq: Four times a day (QID) | INTRAMUSCULAR | Status: DC | PRN

## 2015-07-20 MED ORDER — NALBUPHINE HCL 10 MG/ML IJ SOLN
5.0000 mg | Freq: Once | INTRAMUSCULAR | Status: AC | PRN
Start: 2015-07-20 — End: 2015-07-20

## 2015-07-20 MED ORDER — SCOPOLAMINE 1 MG/3DAYS TD PT72
1.0000 | MEDICATED_PATCH | Freq: Once | TRANSDERMAL | Status: AC
  Administered 2015-07-20: 1.5 mg via TRANSDERMAL

## 2015-07-20 MED ORDER — BUPIVACAINE IN DEXTROSE 0.75-8.25 % IT SOLN
INTRATHECAL | Status: DC | PRN
  Administered 2015-07-20: 11 mg via INTRATHECAL

## 2015-07-20 MED ORDER — OXYCODONE-ACETAMINOPHEN 5-325 MG PO TABS
1.0000 | ORAL_TABLET | ORAL | Status: DC | PRN
  Administered 2015-07-21 – 2015-07-22 (×3): 1 via ORAL
  Filled 2015-07-20 (×3): qty 1

## 2015-07-20 MED ORDER — FENTANYL CITRATE (PF) 100 MCG/2ML IJ SOLN
INTRAMUSCULAR | Status: AC
  Filled 2015-07-20: qty 2

## 2015-07-20 MED ORDER — NALBUPHINE HCL 10 MG/ML IJ SOLN
5.0000 mg | Freq: Once | INTRAMUSCULAR | Status: AC | PRN
  Administered 2015-07-20: 5 mg via SUBCUTANEOUS

## 2015-07-20 MED ORDER — LACTATED RINGERS IV SOLN
40.0000 [IU] | INTRAVENOUS | Status: DC | PRN
Start: 2015-07-20 — End: 2015-07-20
  Administered 2015-07-20: 40 [IU] via INTRAVENOUS

## 2015-07-20 MED ORDER — MEPERIDINE HCL 25 MG/ML IJ SOLN: 6.2500 mg | INTRAMUSCULAR | Status: DC | PRN

## 2015-07-20 MED ORDER — OXYTOCIN 10 UNIT/ML IJ SOLN: 2.5000 [IU]/h | INTRAVENOUS | Status: AC

## 2015-07-20 MED ORDER — OXYTOCIN 10 UNIT/ML IJ SOLN
INTRAMUSCULAR | Status: AC
  Filled 2015-07-20: qty 4

## 2015-07-20 MED ORDER — SCOPOLAMINE 1 MG/3DAYS TD PT72: 1.0000 | MEDICATED_PATCH | Freq: Once | TRANSDERMAL | Status: DC

## 2015-07-20 MED ORDER — CLINDAMYCIN PHOSPHATE 900 MG/50ML IV SOLN
900.0000 mg | Freq: Three times a day (TID) | INTRAVENOUS | Status: AC
  Administered 2015-07-20 – 2015-07-21 (×2): 900 mg via INTRAVENOUS
  Filled 2015-07-20 (×3): qty 50

## 2015-07-20 MED ORDER — SODIUM CHLORIDE 0.9 % IV SOLN
8.0000 mg | Freq: Four times a day (QID) | INTRAVENOUS | Status: DC | PRN
  Administered 2015-07-20: 8 mg via INTRAVENOUS
  Filled 2015-07-20 (×2): qty 4

## 2015-07-20 MED ORDER — DIPHENHYDRAMINE HCL 25 MG PO CAPS
25.0000 mg | ORAL_CAPSULE | Freq: Four times a day (QID) | ORAL | Status: DC | PRN
  Administered 2015-07-20 – 2015-07-21 (×4): 25 mg via ORAL
  Filled 2015-07-20 (×4): qty 1

## 2015-07-20 MED ORDER — SODIUM CHLORIDE 0.9% FLUSH: 3.0000 mL | INTRAVENOUS | Status: DC | PRN

## 2015-07-20 MED ORDER — LACTATED RINGERS IV SOLN
INTRAVENOUS | Status: DC
  Administered 2015-07-20 – 2015-07-21 (×3): via INTRAVENOUS

## 2015-07-20 MED ORDER — FENTANYL CITRATE (PF) 100 MCG/2ML IJ SOLN
INTRAMUSCULAR | Status: DC | PRN
  Administered 2015-07-20: 20 ug via INTRATHECAL

## 2015-07-20 MED ORDER — DIPHENHYDRAMINE HCL 50 MG/ML IJ SOLN
INTRAMUSCULAR | Status: AC
  Filled 2015-07-20: qty 1

## 2015-07-20 MED ORDER — MORPHINE SULFATE (PF) 0.5 MG/ML IJ SOLN
INTRAMUSCULAR | Status: AC
  Filled 2015-07-20: qty 10

## 2015-07-20 MED ORDER — PHENYLEPHRINE 8 MG IN D5W 100 ML (0.08MG/ML) PREMIX OPTIME
INJECTION | INTRAVENOUS | Status: AC
  Filled 2015-07-20: qty 100

## 2015-07-20 MED ORDER — SIMETHICONE 80 MG PO CHEW: 80.0000 mg | CHEWABLE_TABLET | ORAL | Status: DC | PRN

## 2015-07-20 MED ORDER — SIMETHICONE 80 MG PO CHEW
80.0000 mg | CHEWABLE_TABLET | Freq: Three times a day (TID) | ORAL | Status: DC
  Administered 2015-07-20 – 2015-07-23 (×7): 80 mg via ORAL
  Filled 2015-07-20 (×8): qty 1

## 2015-07-20 MED ORDER — TETANUS-DIPHTH-ACELL PERTUSSIS 5-2.5-18.5 LF-MCG/0.5 IM SUSP: 0.5000 mL | Freq: Once | INTRAMUSCULAR | Status: DC

## 2015-07-20 MED ORDER — ONDANSETRON HCL 4 MG/2ML IJ SOLN
INTRAMUSCULAR | Status: DC | PRN
  Administered 2015-07-20: 4 mg via INTRAVENOUS

## 2015-07-20 MED ORDER — DIPHENHYDRAMINE HCL 50 MG/ML IJ SOLN
12.5000 mg | INTRAMUSCULAR | Status: DC | PRN
  Administered 2015-07-20: 12.5 mg via INTRAVENOUS

## 2015-07-20 MED ORDER — MENTHOL 3 MG MT LOZG: 1.0000 | LOZENGE | OROMUCOSAL | Status: DC | PRN

## 2015-07-20 MED ORDER — GENTAMICIN SULFATE 40 MG/ML IJ SOLN: Freq: Three times a day (TID) | INTRAVENOUS | Status: DC

## 2015-07-20 SURGICAL SUPPLY — 37 items
CHLORAPREP W/TINT 26ML (MISCELLANEOUS) ×2 IMPLANT
CLAMP CORD UMBIL (MISCELLANEOUS) IMPLANT
CLOTH BEACON ORANGE TIMEOUT ST (SAFETY) ×2 IMPLANT
CONTAINER PREFILL 10% NBF 15ML (MISCELLANEOUS) ×4 IMPLANT
DRSG OPSITE POSTOP 4X10 (GAUZE/BANDAGES/DRESSINGS) ×2 IMPLANT
ELECT REM PT RETURN 9FT ADLT (ELECTROSURGICAL) ×2
ELECTRODE REM PT RTRN 9FT ADLT (ELECTROSURGICAL) ×1 IMPLANT
EXTRACTOR VACUUM M CUP 4 TUBE (SUCTIONS) IMPLANT
GLOVE BIO SURGEON STRL SZ8 (GLOVE) ×2 IMPLANT
GLOVE BIOGEL PI IND STRL 7.0 (GLOVE) ×2 IMPLANT
GLOVE BIOGEL PI INDICATOR 7.0 (GLOVE) ×2
GOWN STRL REUS W/TWL LRG LVL3 (GOWN DISPOSABLE) ×4 IMPLANT
KIT ABG SYR 3ML LUER SLIP (SYRINGE) IMPLANT
LIQUID BAND (GAUZE/BANDAGES/DRESSINGS) IMPLANT
NEEDLE HYPO 22GX1.5 SAFETY (NEEDLE) ×2 IMPLANT
NEEDLE HYPO 25X5/8 SAFETYGLIDE (NEEDLE) ×2 IMPLANT
NS IRRIG 1000ML POUR BTL (IV SOLUTION) ×2 IMPLANT
PACK C SECTION WH (CUSTOM PROCEDURE TRAY) ×2 IMPLANT
PAD OB MATERNITY 4.3X12.25 (PERSONAL CARE ITEMS) ×2 IMPLANT
PENCIL SMOKE EVAC W/HOLSTER (ELECTROSURGICAL) ×2 IMPLANT
RETRACTOR TRAXI PANNICULUS (MISCELLANEOUS) ×1 IMPLANT
RTRCTR C-SECT PINK 25CM LRG (MISCELLANEOUS) ×2 IMPLANT
STAPLER VISISTAT 35W (STAPLE) ×2 IMPLANT
SUT GUT PLAIN 0 CT-3 TAN 27 (SUTURE) IMPLANT
SUT MNCRL 0 VIOLET CTX 36 (SUTURE) ×3 IMPLANT
SUT MNCRL AB 4-0 PS2 18 (SUTURE) IMPLANT
SUT MON AB 2-0 CT1 27 (SUTURE) ×2 IMPLANT
SUT MON AB 3-0 SH 27 (SUTURE)
SUT MON AB 3-0 SH27 (SUTURE) IMPLANT
SUT MONOCRYL 0 CTX 36 (SUTURE) ×3
SUT PLAIN 2 0 XLH (SUTURE) ×2 IMPLANT
SUT VIC AB 0 CTX 36 (SUTURE) ×2
SUT VIC AB 0 CTX36XBRD ANBCTRL (SUTURE) ×2 IMPLANT
SYR CONTROL 10ML LL (SYRINGE) ×2 IMPLANT
TOWEL OR 17X24 6PK STRL BLUE (TOWEL DISPOSABLE) ×2 IMPLANT
TRAXI PANNICULUS RETRACTOR (MISCELLANEOUS) ×1
TRAY FOLEY CATH SILVER 14FR (SET/KITS/TRAYS/PACK) ×2 IMPLANT

## 2015-07-20 NOTE — Lactation Note (Signed)
This note was copied from a baby's chart. Lactation Consultation Note  Patient Name: [name] [id number]'[id number]'s Date: 07/20/2015 Reason for consult: Initial assessment Baby at 5 hr of life and mom is worried about supply. She has a DEBP set up but when she used it she only got drops. She stated has experience bf but stated she has forgotten how. She reports no issues bf the other children. Discussed baby behavior, feeding frequency, pumping,baby belly size, voids, wt loss, breast changes, and nipple care. Demonstrated manual expression, colostrum noted bilaterally. Given lactation handouts. Aware of OP services and support group. Encouraged mom to keep putting baby to breast 8+/24hr on demand. She can pump if she wants to but should not expect to get a lot milk right now. Mom reported baby was turning blue. Baby appeared pink and breathing normally. Instructed mom to pull emergency alarm if it happens again. Report given to RN.         Maternal Data Has patient been taught Hand Expression?: Yes Does the patient have breastfeeding experience prior to this delivery?: Yes  Feeding Feeding Type: Breast Fed Length of feed: 6 min  LATCH Score/Interventions                      Lactation Tools Discussed/Used WIC Program: Yes   Consult Status Consult Status: Follow-up Date: 07/21/15 Follow-up type: In-patient    Rulon Eisenmengerlizabeth E [name] 07/20/2015, 6:45 PM

## 2015-07-20 NOTE — H&P (Signed)
[name] is a 35 y.o. female presenting for repeat C/S. Maternal Medical History:  Prenatal complications: no prenatal complications Prenatal Complications - Diabetes: none.    OB History    Gravida Para Term Preterm AB TAB SAB Ectopic Multiple Living   5 3 3  1  1   3      Past Medical History  Diagnosis Date  . Arthritis     "back; right foot; left ankle" (07/22/2014)  . Chronic lower back pain   . Cellulitis of leg, right 07/2014  . Morbid obesity with BMI of 50.0-59.9, adult [facility](HCC)    Past Surgical History  Procedure Laterality Date  . Cesarean section  1994; 2010  . Dilation and curettage of uterus  ~ 2008   Family History: family history includes Diabetes in her father and maternal grandfather; Hypertension in her mother. Social History:  reports that she has never smoked. She has never used smokeless tobacco. She reports that she does not drink alcohol or use illicit drugs.   Prenatal Transfer Tool  Maternal Diabetes: No Genetic Screening: Normal Maternal Ultrasounds/Referrals: Normal Fetal Ultrasounds or other Referrals:  Referred to Materal Fetal Medicine  Maternal Substance Abuse:  No Significant Maternal Medications:  None Significant Maternal Lab Results:  None Other Comments:  None  Review of Systems  All other systems reviewed and are negative.     Last menstrual period 10/07/2014. Maternal Exam:  Abdomen: Patient reports no abdominal tenderness. Introitus: not evaluated.   Cervix: not evaluated.   Physical Exam  Nursing note and vitals reviewed. Constitutional: She is oriented to person, place, and time. She appears well-developed and well-nourished.  HENT:  Head: Normocephalic and atraumatic.  Eyes: Conjunctivae are normal. Pupils are equal, round, and reactive to light.  Neck: Normal range of motion. Neck supple.  Cardiovascular: Normal rate and regular rhythm.   Respiratory: Effort normal and breath sounds normal.  GI: Soft. Bowel sounds  are normal.  Musculoskeletal: Normal range of motion.  Neurological: She is alert and oriented to person, place, and time.  Skin: Skin is warm and dry.  Psychiatric: She has a normal mood and affect. Her behavior is normal. Judgment and thought content normal.    Prenatal labs: ABO, Rh: --/--/B POS (04/18 1010) Antibody: NEG (04/18 1010) Rubella: 2.14 (09/15 1118) RPR: Non Reactive (04/18 1010)  HBsAg: NEGATIVE (09/15 1118)  HIV: NONREACTIVE (02/02 0948)  GBS:     Assessment/Plan: 39 weeks.  Previous C/S x 2.  Desires repeat C/S.   [name] A 07/20/2015, 11:18 AM

## 2015-07-20 NOTE — Op Note (Signed)
Cesarean Section Procedure Note   [name]   07/20/2015  Indications: Scheduled Proceedure/Maternal Request.     Pre-operative Diagnosis: PREV C/S, Desires Sterilization.   Post-operative Diagnosis: Same   Surgeon: Coral CeoHARPER,CHARLES A  Assistants: Kathreen CosierMarshall, Bernard A.  Anesthesia: spinal  Procedure:  Repeat Low Transverse Cesarean Section and Bilateral Partial Salpingectomy  Procedure Details:  The patient was seen in the Holding Room. The risks, benefits, complications, treatment options, and expected outcomes were discussed with the patient. The patient concurred with the proposed plan, giving informed consent. The patient was identified as [name] and the procedure verified as C-Section Delivery. A Time Out was held and the above information confirmed.  After induction of anesthesia, the patient was draped and prepped in the usual sterile manner. A transverse incision was made and carried down through the subcutaneous tissue to the fascia. The fascial incision was made and extended transversely. The fascia was separated from the underlying rectus tissue superiorly and inferiorly. The peritoneum was identified and entered. The peritoneal incision was extended longitudinally. The utero-vesical peritoneal reflection was incised transversely and the bladder flap was bluntly freed from the lower uterine segment. A low transverse uterine incision was made. Delivered from cephalic presentation was a 3870 gram living newborn female infant(s). APGAR (1 MIN): 8   APGAR (5 MINS): 9   APGAR (10 MINS):    A cord ph was not sent. The umbilical cord was clamped and cut cord. A sample was obtained for evaluation. The placenta was removed Intact and appeared normal.  The uterine incision was closed with running locked sutures of 0 Monocryl.   Hemostasis was observed.  Attention then turned above for BTL procedure.  The left fallopian tube was grasped in the isthmic portion and identified from cornu  to fimbria in the grasp of the Babcock clamp.  The knuckle of tube beneath the clamp was suture ligated through the mesosalpinx, and the knuckle of tube beneath the clamp was excised above the knot and submitted to Pathology for evaluation.  The tubal stumps were hemostatic and were place back in their normal anatomic position.  The same procedure was performed on the opposite side without complications. The paracolic gutters were irrigated. The parieto peritoneum was closed in a running fashion with 2-0 Vicryl.  The fascia was then reapproximated with running sutures of 0 vicryl.  A subcutaneous running suture of 2-0 plain placed.  The skin was closed with staples.  Instrument, sponge, and needle counts were correct prior the abdominal closure and were correct at the conclusion of the case.    Findings:  Normal uterus, ovaries and tubes   Estimated Blood Loss: 800ml  Total IV Fluids: 2700ml   Urine Output: 200CC OF clear urine  Specimens: @ORSPECIMEN @   Complications: no complications  Disposition: PACU - hemodynamically stable.  Maternal Condition: stable   Baby condition / location:  Couplet care / Skin to Skin    Signed: Surgeon(s): Brock Badharles A Harper, MD Kathreen CosierBernard A Marshall, MD

## 2015-07-20 NOTE — Anesthesia Preprocedure Evaluation (Addendum)
Anesthesia Evaluation  Patient identified by MRN, date of birth, ID band Patient awake    Reviewed: Allergy & Precautions, NPO status , Patient's Chart, lab work & pertinent test results  Airway Mallampati: III  TM Distance: >3 FB Neck ROM: Full    Dental no notable dental hx. (+) Teeth Intact   Pulmonary neg pulmonary ROS,    Pulmonary exam normal breath sounds clear to auscultation       Cardiovascular negative cardio ROS Normal cardiovascular exam Rhythm:Regular Rate:Normal     Neuro/Psych negative neurological ROS  negative psych ROS   GI/Hepatic Neg liver ROS, GERD  Medicated and Controlled,  Endo/Other  diabetes, Well ControlledMorbid obesitySuper MO  Renal/GU negative Renal ROS  negative genitourinary   Musculoskeletal  (+) Arthritis , Osteoarthritis,  Chronic LBP Right foot pain   Abdominal (+) + obese,   Peds  Hematology  (+) anemia ,   Anesthesia Other Findings   Reproductive/Obstetrics (+) Pregnancy Previous C/Section AMA                            Anesthesia Physical Anesthesia Plan  ASA: III  Anesthesia Plan: Combined Spinal and Epidural   Post-op Pain Management:    Induction:   Airway Management Planned: Natural Airway  Additional Equipment:   Intra-op Plan:   Post-operative Plan:   Informed Consent: I have reviewed the patients History and Physical, chart, labs and discussed the procedure including the risks, benefits and alternatives for the proposed anesthesia with the patient or authorized representative who has indicated his/her understanding and acceptance.     Plan Discussed with: CRNA, Anesthesiologist and Surgeon  Anesthesia Plan Comments:         Anesthesia Quick Evaluation

## 2015-07-20 NOTE — Progress Notes (Signed)
Educated patient to only consume clear liquids until 1900 tonight. Patient ate a cheeseburger at 1730 and started vomiting profusely. MD notified for zofran order. Will continue to monitor.

## 2015-07-20 NOTE — Anesthesia Postprocedure Evaluation (Signed)
Anesthesia Post Note  Patient: [name]  Procedure(s) Performed: Procedure(s) (LRB): REPEAT CESAREAN SECTION (N/A)  Patient location during evaluation: PACU Anesthesia Type: Spinal Level of consciousness: awake and alert and oriented Pain management: pain level controlled Vital Signs Assessment: post-procedure vital signs reviewed and stable Respiratory status: spontaneous breathing, nonlabored ventilation and respiratory function stable Cardiovascular status: blood pressure returned to baseline and stable Postop Assessment: no headache, no backache, spinal receding, patient able to bend at knees and no signs of nausea or vomiting Anesthetic complications: no    Last Vitals:  Filed Vitals:   07/20/15 1428 07/20/15 1429  BP:    Pulse: 81 80  Temp:    Resp: 14 16    Last Pain:  Filed Vitals:   07/20/15 1429  PainSc: 0-No pain                 Teagon Kron A.

## 2015-07-20 NOTE — Anesthesia Procedure Notes (Signed)
Spinal Patient location during procedure: OR Start time: 07/20/2015 12:33 PM Staffing Anesthesiologist: Mal AmabileFOSTER, Vianey Caniglia Performed by: anesthesiologist  Preanesthetic Checklist Completed: patient identified, site marked, surgical consent, pre-op evaluation, timeout performed, IV checked, risks and benefits discussed and monitors and equipment checked Spinal Block Patient position: sitting Prep: site prepped and draped and DuraPrep Patient monitoring: heart rate, cardiac monitor, continuous pulse ox and blood pressure Approach: midline Location: L3-4 Injection technique: single-shot Needle Needle type: Sprotte  Needle gauge: 24 G Needle length: 9 cm Assessment Sensory level: T4 Additional Notes Patient tolerated procedure well. Adequate sensory level.

## 2015-07-20 NOTE — Transfer of Care (Signed)
Immediate Anesthesia Transfer of Care Note  Patient: Ronny FlurryLalita N Docken  Procedure(s) Performed: Procedure(s): REPEAT CESAREAN SECTION (N/A)  Patient Location: PACU  Anesthesia Type:Spinal  Level of Consciousness: awake  Airway & Oxygen Therapy: Patient Spontanous Breathing  Post-op Assessment: Report given to RN  Post vital signs: Reviewed and stable  Last Vitals:  Filed Vitals:   07/20/15 1141  BP: 142/87  Pulse: 84  Temp: 36.8 C  Resp: 20    Complications: No apparent anesthesia complications

## 2015-07-21 ENCOUNTER — Encounter (HOSPITAL_COMMUNITY): Payer: Self-pay | Admitting: Obstetrics

## 2015-07-21 LAB — CBC
HCT: 36.6 % (ref 36.0–46.0)
Hemoglobin: 12.1 g/dL (ref 12.0–15.0)
MCH: 27.5 pg (ref 26.0–34.0)
MCHC: 33.1 g/dL (ref 30.0–36.0)
MCV: 83.2 fL (ref 78.0–100.0)
PLATELETS: 149 10*3/uL — AB (ref 150–400)
RBC: 4.4 MIL/uL (ref 3.87–5.11)
RDW: 14.2 % (ref 11.5–15.5)
WBC: 10.3 10*3/uL (ref 4.0–10.5)

## 2015-07-21 LAB — BIRTH TISSUE RECOVERY COLLECTION (PLACENTA DONATION)

## 2015-07-21 MED ORDER — SODIUM CHLORIDE 0.9 % IV BOLUS (SEPSIS)
1000.0000 mL | Freq: Once | INTRAVENOUS | Status: AC
  Administered 2015-07-21: 1000 mL via INTRAVENOUS

## 2015-07-21 MED ORDER — LACTATED RINGERS IV BOLUS (SEPSIS)
1000.0000 mL | Freq: Once | INTRAVENOUS | Status: AC
  Administered 2015-07-21: 1000 mL via INTRAVENOUS

## 2015-07-21 NOTE — Progress Notes (Signed)
Subjective: Postpartum Day 1: Cesarean Delivery Patient reports tolerating PO, + flatus and no problems voiding.    Objective: Vital signs in last 24 hours: Temp:  [97.4 F (36.3 C)-97.8 F (36.6 C)] 97.8 F (36.6 C) (04/20 1700) Pulse Rate:  [57-75] 75 (04/20 1700) Resp:  [19-20] 19 (04/20 1700) BP: (101-133)/(52-81) 133/76 mmHg (04/20 1700) SpO2:  [96 %-98 %] 97 % (04/20 1305) Weight:  [342 lb (155.13 kg)] 342 lb (155.13 kg) (04/20 0900)  Physical Exam:  General: alert and no distress Lochia: appropriate Uterine Fundus: firm Incision: healing well DVT Evaluation: No evidence of DVT seen on physical exam.   Recent Labs  07/19/15 1010 07/21/15 1229  HGB 13.1 12.1  HCT 39.1 36.6    Assessment/Plan: Status post Cesarean section. Doing well postoperatively.  Continue current care.  Vail Basista A 07/21/2015, 11:18 PM

## 2015-07-21 NOTE — Anesthesia Postprocedure Evaluation (Signed)
Anesthesia Post Note  Patient: [name]  Procedure(s) Performed: Procedure(s) (LRB): REPEAT CESAREAN SECTION (N/A)  Patient location during evaluation: Mother Baby Anesthesia Type: Spinal Level of consciousness: oriented and awake and alert Pain management: pain level controlled Vital Signs Assessment: post-procedure vital signs reviewed and stable Respiratory status: spontaneous breathing and respiratory function stable Cardiovascular status: blood pressure returned to baseline and stable Postop Assessment: no headache and no backache Anesthetic complications: no    Last Vitals:  Filed Vitals:   07/21/15 0003 07/21/15 0405  BP: 116/81 104/52  Pulse: 57 63  Temp: 36.3 C 36.5 C  Resp: 20 20    Last Pain:  Filed Vitals:   07/21/15 0738  PainSc: 5                  Tayven Renteria

## 2015-07-21 NOTE — Addendum Note (Signed)
Addendum  created 07/21/15 0749 by Junious SilkMelinda Raeleigh Guinn, CRNA   Modules edited: Clinical Notes   Clinical Notes:  File: [id number]

## 2015-07-22 ENCOUNTER — Other Ambulatory Visit: Payer: Self-pay | Admitting: Certified Nurse Midwife

## 2015-07-22 ENCOUNTER — Encounter (HOSPITAL_COMMUNITY): Payer: Self-pay | Admitting: *Deleted

## 2015-07-22 NOTE — Progress Notes (Signed)
Subjective: Postpartum Day 1: Cesarean Delivery Patient reports tolerating PO, + flatus and no problems voiding.    Objective: Vital signs in last 24 hours: Temp:  [97.6 F (36.4 C)-97.8 F (36.6 C)] 97.8 F (36.6 C) (04/20 1700) Pulse Rate:  [66-75] 75 (04/20 1700) Resp:  [19-20] 19 (04/20 1700) BP: (101-133)/(56-76) 133/76 mmHg (04/20 1700) SpO2:  [96 %-97 %] 97 % (04/20 1305) Weight:  [342 lb (155.13 kg)] 342 lb (155.13 kg) (04/20 0900)  Physical Exam:  General: alert and no distress Lochia: appropriate Uterine Fundus: firm Incision: healing well DVT Evaluation: No evidence of DVT seen on physical exam.   Recent Labs  07/19/15 1010 07/21/15 1229  HGB 13.1 12.1  HCT 39.1 36.6    Assessment/Plan: Status post Cesarean section. Doing well postoperatively.  Continue current care.  HARPER,CHARLES A 07/22/2015, 6:08 AM

## 2015-07-22 NOTE — Lactation Note (Signed)
This note was copied from a baby's chart. Lactation Consultation Note  Follow up visit at 53 hours of age.  Mom reports baby is eating a lot and hungry all the time.  Baby has 10 recorded feedings, LC encouraged mom 8/12 feedings daily is normal.  Mom reported peds told her it was ok to supplement with formula if she needed to.  Mom reports student RN gave her a bottle of formula and unsure how much was in it and then baby slept for a little bit.  Encouraged mom to use her breastmilk.  Assisted mom with hand expression of easily expressed transitional milk.  Mom latched baby 1st in cradle hold and allowed baby to suck on tip of nipple.  [facility] cross cradle hold to get a deep latch. Baby has tight upper lip that does not flange well.  LC instructed mom on how to know if her baby was feeding well and to listen for swallows.  Baby has had good output and mom denies pain with latched.  [facility] better.  Baby is not eager at the breast at this time with intermittent sucking noted.   Patient Name: [name] JXBJY'NToday's Date: 07/22/2015 Reason for consult: Follow-up assessment   Maternal Data    Feeding Feeding Type: Breast Fed Length of feed: 20 min  LATCH Score/Interventions Latch: Grasps breast easily, tongue down, lips flanged, rhythmical sucking. (upper lip tight rolled to flange)  Audible Swallowing: A few with stimulation Intervention(s): Alternate breast massage;Hand expression  Type of Nipple: Everted at rest and after stimulation  Comfort (Breast/Nipple): Soft / non-tender     Hold (Positioning): Assistance needed to correctly position infant at breast and maintain latch. Intervention(s): Breastfeeding basics reviewed;Support Pillows;Position options  LATCH Score: 8  Lactation Tools Discussed/Used     Consult Status Consult Status: Follow-up Date: 07/23/15 Follow-up type: In-patient    [name], Arvella MerlesJana  Strickland 07/22/2015, 6:13 PM

## 2015-07-23 LAB — TYPE AND SCREEN
ABO/RH(D): B POS
ANTIBODY SCREEN: NEGATIVE
UNIT DIVISION: 0
Unit division: 0

## 2015-07-23 NOTE — Discharge Summary (Signed)
Obstetric Discharge Summary Reason for Admission: cesarean section Prenatal Procedures: none Intrapartum Procedures: cesarean: low cervical, transverse Postpartum Procedures: none Complications-Operative and Postpartum: none HEMOGLOBIN  Date Value Ref Range Status  07/21/2015 12.1 12.0 - 15.0 g/dL Final   HCT  Date Value Ref Range Status  07/21/2015 36.6 36.0 - 46.0 % Final    Physical Exam:  General: alert Lochia: appropriate Uterine Fundus: firm Incision: healing well DVT Evaluation: No evidence of DVT seen on physical exam.  Discharge Diagnoses: Term Pregnancy-delivered  Discharge Information: Date: 07/23/2015 Activity: pelvic rest Diet: routine Medications: Percocet Condition: improved Instructions: refer to practice specific booklet Discharge to: home Follow-up Information    Follow up with HARPER,CHARLES A, MD.   Specialty:  Obstetrics and Gynecology   Contact information:   [address] [place] [id number] [phone]       Newborn Data: Live born female  Birth Weight: 8 lb 8.5 oz (3870 g) APGAR: 8, 9  Home with mother.  Miles Leyda A 07/23/2015, 7:55 AM

## 2015-07-23 NOTE — Discharge Instructions (Signed)
Discharge instructions ° °· You can wash your hair °· Shower °· Eat what you want °· Drink what you want °· See me in 6 weeks °· Your ankles are going to swell more in the next 2 weeks than when pregnant °· No sex for 6 weeks ° ° °Tyde Lamison A, MD 07/23/2015 ° ° °

## 2015-07-23 NOTE — Progress Notes (Signed)
Patient ID: Christina FlurryLalita N Strickland, female   DOB: [birth year], 35 y.o.   MRN: [id number] Postop day 3 Blood pressure 106/54 pulse 65 respiration 18 Fundus firm Lochia moderate Legs negative home today

## 2015-07-25 ENCOUNTER — Encounter: Payer: Self-pay | Admitting: Obstetrics

## 2015-07-25 ENCOUNTER — Ambulatory Visit (INDEPENDENT_AMBULATORY_CARE_PROVIDER_SITE_OTHER): Payer: Medicaid Other | Admitting: Obstetrics

## 2015-07-25 DIAGNOSIS — F53 Postpartum depression: Secondary | ICD-10-CM

## 2015-07-25 DIAGNOSIS — O9089 Other complications of the puerperium, not elsewhere classified: Secondary | ICD-10-CM

## 2015-07-25 DIAGNOSIS — R52 Pain, unspecified: Secondary | ICD-10-CM

## 2015-07-25 DIAGNOSIS — O99345 Other mental disorders complicating the puerperium: Secondary | ICD-10-CM

## 2015-07-25 MED ORDER — IBUPROFEN 800 MG PO TABS: 800.0000 mg | ORAL_TABLET | Freq: Three times a day (TID) | ORAL | Status: DC | PRN

## 2015-07-25 MED ORDER — SERTRALINE HCL 50 MG PO TABS: 50.0000 mg | ORAL_TABLET | Freq: Every day | ORAL | Status: DC

## 2015-07-25 NOTE — Progress Notes (Signed)
   Subjective:    Patient ID: Ronny FlurryLalita N Holdman, female    DOB: December 17, 1980, 35 y.o.   MRN: [id number]  HPI:  Presents 5 days post op repeat LTCS for removal of staples.  C/O uncontrollable crying and emotional lability.    Review of Systems  14 point ROS negative except for postpartum depression    Objective:   Physical Exam   PE:      General:  Alert and crying.      HEENT:  Negating      Breasts:  Soft, NT.      Abdomen:  Incision C, D, I.  Non tender.  Staples removed and steri strips applied      Extremities:  No C, C, E.        Assessment & Plan:   Postpartum Day #5.Marland Kitchen.  Staples out.   Postpartum Depression.  Zoloft Rx.  Counseling and educational material dispensed. F/U 4 weeks.  Coral Ceoharles Harper MD

## 2015-07-25 NOTE — Patient Instructions (Signed)
Postpartum Depression and Baby Blues °The postpartum period begins right after the birth of a baby. During this time, there is often a great amount of joy and excitement. It is also a time of many changes in the life of the parents. Regardless of how many times a mother gives birth, each child brings new challenges and dynamics to the family. It is not unusual to have feelings of excitement along with confusing shifts in moods, emotions, and thoughts. All mothers are at risk of developing postpartum depression or the "baby blues." These mood changes can occur right after giving birth, or they may occur many months after giving birth. The baby blues or postpartum depression can be mild or severe. Additionally, postpartum depression can go away rather quickly, or it can be a long-term condition.  °CAUSES °Raised hormone levels and the rapid drop in those levels are thought to be a main cause of postpartum depression and the baby blues. A number of hormones change during and after pregnancy. Estrogen and progesterone usually decrease right after the delivery of your baby. The levels of thyroid hormone and various cortisol steroids also rapidly drop. Other factors that play a role in these mood changes include major life events and genetics.  °RISK FACTORS °If you have any of the following risks for the baby blues or postpartum depression, know what symptoms to watch out for during the postpartum period. Risk factors that may increase the likelihood of getting the baby blues or postpartum depression include: °· Having a personal or family history of depression.   °· Having depression while being pregnant.   °· Having premenstrual mood issues or mood issues related to oral contraceptives. °· Having a lot of life stress.   °· Having marital conflict.   °· Lacking a social support network.   °· Having a baby with special needs.   °· Having health problems, such as diabetes.   °SIGNS AND SYMPTOMS °Symptoms of baby blues  include: °· Brief changes in mood, such as going from extreme happiness to sadness. °· Decreased concentration.   °· Difficulty sleeping.   °· Crying spells, tearfulness.   °· Irritability.   °· Anxiety.   °Symptoms of postpartum depression typically begin within the first month after giving birth. These symptoms include: °· Difficulty sleeping or excessive sleepiness.   °· Marked weight loss.   °· Agitation.   °· Feelings of worthlessness.   °· Lack of interest in activity or food.   °Postpartum psychosis is a very serious condition and can be dangerous. Fortunately, it is rare. Displaying any of the following symptoms is cause for immediate medical attention. Symptoms of postpartum psychosis include:  °· Hallucinations and delusions.   °· Bizarre or disorganized behavior.   °· Confusion or disorientation.   °DIAGNOSIS  °A diagnosis is made by an evaluation of your symptoms. There are no medical or lab tests that lead to a diagnosis, but there are various questionnaires that a health care provider may use to identify those with the baby blues, postpartum depression, or psychosis. Often, a screening tool called the Edinburgh Postnatal Depression Scale is used to diagnose depression in the postpartum period.  °TREATMENT °The baby blues usually goes away on its own in 1-2 weeks. Social support is often all that is needed. You will be encouraged to get adequate sleep and rest. Occasionally, you may be given medicines to help you sleep.  °Postpartum depression requires treatment because it can last several months or longer if it is not treated. Treatment may include individual or group therapy, medicine, or both to address any social, physiological, and psychological   factors that may play a role in the depression. Regular exercise, a healthy diet, rest, and social support may also be strongly recommended.  °Postpartum psychosis is more serious and needs treatment right away. Hospitalization is often needed. °HOME CARE  INSTRUCTIONS °· Get as much rest as you can. Nap when the baby sleeps.   °· Exercise regularly. Some women find yoga and walking to be beneficial.   °· Eat a balanced and nourishing diet.   °· Do little things that you enjoy. Have a cup of tea, take a bubble bath, read your favorite magazine, or listen to your favorite music. °· Avoid alcohol.   °· Ask for help with household chores, cooking, grocery shopping, or running errands as needed. Do not try to do everything.   °· Talk to people close to you about how you are feeling. Get support from your partner, family members, friends, or other new moms. °· Try to stay positive in how you think. Think about the things you are grateful for.   °· Do not spend a lot of time alone.   °· Only take over-the-counter or prescription medicine as directed by your health care provider. °· Keep all your postpartum appointments.   °· Let your health care provider know if you have any concerns.   °SEEK MEDICAL CARE IF: °You are having a reaction to or problems with your medicine. °SEEK IMMEDIATE MEDICAL CARE IF: °· You have suicidal feelings.   °· You think you may harm the baby or someone else. °MAKE SURE YOU: °· Understand these instructions. °· Will watch your condition. °· Will get help right away if you are not doing well or get worse. °  °This information is not intended to replace advice given to you by your health care provider. Make sure you discuss any questions you have with your health care provider. °  °Document Released: 12/22/2003 Document Revised: 03/24/2013 Document Reviewed: 12/29/2012 °Elsevier Interactive Patient Education ©2016 [employer] Inc[employer] ° °

## 2015-07-27 ENCOUNTER — Ambulatory Visit (INDEPENDENT_AMBULATORY_CARE_PROVIDER_SITE_OTHER): Payer: Medicaid Other | Admitting: Obstetrics

## 2015-07-27 ENCOUNTER — Encounter: Payer: Self-pay | Admitting: Obstetrics

## 2015-07-27 VITALS — BP 127/75 | HR 75

## 2015-07-27 DIAGNOSIS — O900 Disruption of cesarean delivery wound: Secondary | ICD-10-CM

## 2015-07-27 NOTE — Progress Notes (Signed)
Subjective: POD# 7 s/p Cesarean Delivery.  Indications: Elective repeat C/S  RH status/Rubella reviewed. Feeding: breast Patient reports tolerating PO.  Denies HA/SOB/C/P/N/V/dizziness.  Reports flatus or BM. Breast symptoms: No..  She reports vaginal bleeding as normal, without clots.  She is ambulating, urinating without difficulty.  Seen by Home Health Nurse today and wound observed to be separating.  Patient sent to office for evaluation.    Objective: Vital signs in last 24 hours: BP 127/75 mmHg  Pulse 75  LMP 10/07/2014       Physical Exam:  General: alert and no distress CV: Regular rate and rhythm Resp: clear Abdomen: Soft, obese, non tender Lochia: minimal Uterine Fundus: firm, below umbilicus, nontender Incision: bloody drainage present and no odor, exudates or erythema Ext: extremities normal, atraumatic, no cyanosis or edema   No results for input(s): HGB, HCT in the last 72 hours.    Assessment/Plan: 35 y.o.  status post Cesarean section. POD# 7.   Wound separation / seroma.     Procedure Note:                                                                                                                                                                                                                                                    Incision opened, probed, lavaged with half strength peroxide and drained.  The fascia was intact.  Wound then packed with saline moistened Kerlex dressing and bandage applied.  Patient tolerated procedure well.  Repeat dressing change in AM.  Will apply for Wound Vac System.to accelerate healing in this morbidly obese patient.     [name] A 07/27/2015, 5:08 PM

## 2015-07-28 ENCOUNTER — Other Ambulatory Visit: Payer: Self-pay | Admitting: Obstetrics

## 2015-07-28 ENCOUNTER — Ambulatory Visit (INDEPENDENT_AMBULATORY_CARE_PROVIDER_SITE_OTHER): Payer: Medicaid Other | Admitting: Obstetrics

## 2015-07-28 ENCOUNTER — Telehealth: Payer: Self-pay | Admitting: *Deleted

## 2015-07-28 VITALS — BP 132/84 | HR 90

## 2015-07-28 DIAGNOSIS — O900 Disruption of cesarean delivery wound: Secondary | ICD-10-CM | POA: Diagnosis not present

## 2015-07-28 NOTE — Telephone Encounter (Signed)
Have sent orders for Wound vac and [facility] health care [facility] has been activated and they are ready to go. Follow up call to [facility]- they denied the request stating they do not have staffing. [facility]. Denied- they stated that do not do post partum wounds?  [facility] and [facility] refused [facility] due to [facility]ing. Dr Clearance CootsHarper called over to the [facility] center [facility] and they could not help her for 2 weeks. [place] office [place] openings. Dr Clearance CootsHarper [facility] to [facility] and talked to [place]. She called her liaison at [facility] at set the patient up for care. Patient advised to come to the office at 4pm today for placement and then home care to start MWF at home.

## 2015-07-29 ENCOUNTER — Encounter: Payer: Self-pay | Admitting: Obstetrics

## 2015-07-29 ENCOUNTER — Other Ambulatory Visit: Payer: Self-pay | Admitting: Obstetrics

## 2015-07-29 DIAGNOSIS — T8131XA Disruption of external operation (surgical) wound, not elsewhere classified, initial encounter: Secondary | ICD-10-CM

## 2015-07-29 NOTE — Progress Notes (Signed)
Patient ID: [name], female   DOB: 26-Dec-1980, 35 y.o.   MRN: [id number]  Chief Complaint  Patient presents with  . Dressing Change    HPI [name] is a 35 y.o. female.  Patient has wound separation.  Presents for dressing change and application of wound vac.  HPI  Past Medical History  Diagnosis Date  . Arthritis     "back; right foot; left ankle" (07/22/2014)  . Chronic lower back pain   . Cellulitis of leg, right 07/2014  . Morbid obesity with BMI of 50.0-59.9, adult (HCC)   . Vaginal delivery 1998    Past Surgical History  Procedure Laterality Date  . Cesarean section  1994; 2010  . Dilation and curettage of uterus  ~ 2008  . Cesarean section N/A 07/20/2015    Procedure: REPEAT CESAREAN SECTION;  Surgeon: Brock Badharles A Mika Griffitts, MD;  Location: [facility];  Service: Obstetrics;  Laterality: N/A;    Family History  Problem Relation Age of Onset  . Hypertension Mother   . Diabetes Maternal Grandfather   . Diabetes Father     Social History Social History  Substance Use Topics  . Smoking status: Never Smoker   . Smokeless tobacco: Never Used  . Alcohol Use: No    Allergies  Allergen Reactions  . Penicillins Hives and Other (See Comments)    Has patient had a PCN reaction causing immediate rash, facial/tongue/throat swelling, SOB or lightheadedness with hypotension: No Has patient had a PCN reaction causing severe rash involving mucus membranes or skin necrosis: No Has patient had a PCN reaction that required hospitalization No Has patient had a PCN reaction occurring within the last 10 years: No If all of the above answers are "NO", then may proceed with Cephalosporin use.  . Tramadol Hcl Other (See Comments)    hallucinations    Current Outpatient Prescriptions  Medication Sig Dispense Refill  . ibuprofen (ADVIL,MOTRIN) 800 MG tablet Take 1 tablet (800 mg total) by mouth every 8 (eight) hours as needed. 30 tablet 5  . IBUPROFEN PO Take by mouth as needed.  Advil/Motrin as needed    . Oxycodone-Acetaminophen (PERCOCET PO) Take by mouth as needed.    . Prenatal MV-Min-Fe Fum-FA-DHA (PRENATAL 1 PO) Take by mouth daily.    . sertraline (ZOLOFT) 50 MG tablet Take 1 tablet (50 mg total) by mouth daily. 30 tablet 5   No current facility-administered medications for this visit.    Review of Systems Review of Systems Constitutional: negative for fatigue and weight loss Respiratory: negative for cough and wheezing Cardiovascular: negative for chest pain, fatigue and palpitations Gastrointestinal: positive for abdominal pain  Genitourinary:negative Integument/breast: negative for nipple discharge Musculoskeletal:negative for myalgias Neurological: negative for gait problems and tremors Behavioral/Psych: negative for abusive relationship, depression Endocrine: negative for temperature intolerance     Blood pressure 132/84, pulse 90, last menstrual period 10/07/2014, unknown if currently breastfeeding.  Physical Exam Physical Exam:        General:  Alert and no distress      Abdomen:  Obese, nontender.  Packing removed.  Incision clean.  Wound Vac system applied.  Data Reviewed Labs  Assessment     Wound seroma / separation     Plan    Continue wound management.   No orders of the defined types were placed in this encounter.   No orders of the defined types were placed in this encounter.

## 2015-07-31 ENCOUNTER — Other Ambulatory Visit: Payer: Self-pay | Admitting: Obstetrics

## 2015-08-01 ENCOUNTER — Other Ambulatory Visit: Payer: Self-pay | Admitting: Obstetrics

## 2015-08-01 DIAGNOSIS — B889 Infestation, unspecified: Secondary | ICD-10-CM

## 2015-08-01 DIAGNOSIS — G8918 Other acute postprocedural pain: Secondary | ICD-10-CM

## 2015-08-01 MED ORDER — OXYCODONE-ACETAMINOPHEN 10-325 MG PO TABS
1.0000 | ORAL_TABLET | ORAL | Status: DC | PRN
Start: 2015-08-01 — End: 2015-09-06

## 2015-08-01 MED ORDER — AMOXICILLIN-POT CLAVULANATE 875-125 MG PO TABS: 1.0000 | ORAL_TABLET | Freq: Two times a day (BID) | ORAL | Status: DC

## 2015-08-03 ENCOUNTER — Ambulatory Visit (INDEPENDENT_AMBULATORY_CARE_PROVIDER_SITE_OTHER): Payer: Medicaid Other | Admitting: Obstetrics

## 2015-08-03 ENCOUNTER — Ambulatory Visit: Payer: Medicaid Other | Admitting: Obstetrics

## 2015-08-03 DIAGNOSIS — IMO0002 Reserved for concepts with insufficient information to code with codable children: Secondary | ICD-10-CM

## 2015-08-04 ENCOUNTER — Encounter: Payer: Self-pay | Admitting: Obstetrics

## 2015-08-04 NOTE — Progress Notes (Signed)
CIRCUMCISION PROCEDURE NOTE  Consent:   The risks and benefits of the procedure were reviewed.  Questions were answered to stated satisfaction.  Informed consent was obtained from the parents. Procedure:   After the infant was identified and restrained, the penis and surrounding area were cleaned with povidone iodine.  A sterile field was created with a drape.  A dorsal penile nerve block was then administered--0.4 ml of 1 percent lidocaine without epinephrine was injected.  The procedure was completed with a size 1.45 GOMCO. Hemostasis was adequate.   The glans was dressed. Preprinted instructions were provided for care after the procedure. 

## 2015-08-10 ENCOUNTER — Ambulatory Visit: Payer: Medicaid Other | Admitting: Obstetrics

## 2015-08-15 ENCOUNTER — Ambulatory Visit (INDEPENDENT_AMBULATORY_CARE_PROVIDER_SITE_OTHER): Payer: Medicaid Other

## 2015-08-15 ENCOUNTER — Ambulatory Visit (HOSPITAL_COMMUNITY)
Admission: EM | Admit: 2015-08-15 | Discharge: 2015-08-15 | Disposition: A | Discharge status: Home / Self Care | Payer: Medicaid Other | Attending: Family Medicine | Admitting: Family Medicine

## 2015-08-15 ENCOUNTER — Encounter (HOSPITAL_COMMUNITY): Payer: Self-pay | Admitting: Emergency Medicine

## 2015-08-15 DIAGNOSIS — S63601A Unspecified sprain of right thumb, initial encounter: Secondary | ICD-10-CM

## 2015-08-15 NOTE — ED Provider Notes (Signed)
CSN: [id number]     Arrival date & time 08/15/15  1533 History   First MD Initiated Contact with Patient 08/15/15 1655     Chief Complaint  Patient presents with  . Hand Injury  . Fall   (Consider location/radiation/quality/duration/timing/severity/associated sxs/prior Treatment) Patient is a 35 y.o. female presenting with hand injury. The history is provided by the patient.  Hand Injury Location:  Hand and wrist Time since incident:  4 days Injury: yes   Mechanism of injury: fall   Fall:    Fall occurred:  Walking Wrist location:  R wrist Hand location:  R hand Pain details:    Progression:  Unchanged Chronicity:  New Dislocation: no   Relieved by:  None tried Worsened by:  Nothing tried Ineffective treatments:  None tried Associated symptoms: decreased range of motion and stiffness     Past Medical History  Diagnosis Date  . Arthritis     "back; right foot; left ankle" (07/22/2014)  . Chronic lower back pain   . Cellulitis of leg, right 07/2014  . Morbid obesity with BMI of 50.0-59.9, adult (HCC)   . Vaginal delivery 1998   Past Surgical History  Procedure Laterality Date  . Cesarean section  1994; 2010  . Dilation and curettage of uterus  ~ 2008  . Cesarean section N/A 07/20/2015    Procedure: REPEAT CESAREAN SECTION;  Surgeon: Brock Bad, MD;  Location: [facility];  Service: Obstetrics;  Laterality: N/A;   Family History  Problem Relation Age of Onset  . Hypertension Mother   . Diabetes Maternal Grandfather   . Diabetes Father    Social History  Substance Use Topics  . Smoking status: Never Smoker   . Smokeless tobacco: Never Used  . Alcohol Use: No   OB History    Gravida Para Term Preterm AB TAB SAB Ectopic Multiple Living   0 4     Review of Systems  Constitutional: Negative.   Musculoskeletal: Positive for joint swelling and stiffness.  Skin: Negative.   All other systems reviewed and are negative.   Allergies  Penicillins and  Tramadol hcl  Home Medications   Prior to Admission medications   Medication Sig Start Date End Date Taking? Authorizing Provider  Prenatal MV-Min-Fe Fum-FA-DHA (PRENATAL 1 PO) Take by mouth daily.   Yes Historical Provider, MD  amoxicillin-clavulanate (AUGMENTIN) 875-125 MG tablet Take 1 tablet by mouth 2 (two) times daily. 1 tablet po BID x10 days 08/01/15   Brock Bad, MD  ibuprofen (ADVIL,MOTRIN) 800 MG tablet Take 1 tablet (800 mg total) by mouth every 8 (eight) hours as needed. 07/25/15   Brock Bad, MD  IBUPROFEN PO Take by mouth as needed. Advil/Motrin as needed    Historical Provider, MD  Oxycodone-Acetaminophen (PERCOCET PO) Take by mouth as needed.    Historical Provider, MD  oxyCODONE-acetaminophen (PERCOCET) 10-325 MG tablet Take 1 tablet by mouth every 4 (four) hours as needed for pain. 08/01/15   Brock Bad, MD  sertraline (ZOLOFT) 50 MG tablet Take 1 tablet (50 mg total) by mouth daily. 07/25/15   Brock Bad, MD   Meds Ordered and Administered this Visit  Medications - No data to display  BP 134/76 mmHg  Pulse 82  Temp(Src) 98.5 F (36.9 C) (Oral)  Resp 12  SpO2 100%  Breastfeeding? Yes No data found.   Physical Exam  Constitutional: She is oriented to person, place, and time.  She appears well-developed and well-nourished. No distress.  Musculoskeletal: She exhibits tenderness.       Right hand: She exhibits decreased range of motion.       Hands: Neurological: She is alert and oriented to person, place, and time.  Skin: Skin is warm and dry.  Nursing note and vitals reviewed.   ED Course  Procedures (including critical care time)  Labs Review Labs Reviewed - No data to display  Imaging Review Dg Wrist Complete Right  08/15/2015  CLINICAL DATA:  Right thumb and right wrist pain status post fall. EXAM: RIGHT WRIST - COMPLETE 3+ VIEW COMPARISON:  None. FINDINGS: There is no evidence of fracture or dislocation. There is no evidence of  arthropathy or other focal bone abnormality. Soft tissues are unremarkable. IMPRESSION: Negative. Electronically Signed   By: Ted Mcalpineobrinka  Dimitrova M.D.   On: 08/15/2015 17:19   Dg Finger Thumb Right  08/15/2015  CLINICAL DATA:  Right thumb pain. EXAM: RIGHT THUMB 2+V COMPARISON:  None. FINDINGS: There is no evidence of fracture or dislocation. There is no evidence of arthropathy or other focal bone abnormality. Soft tissues are unremarkable IMPRESSION: Negative. Electronically Signed   By: Lupita RaiderJames  Green Jr, M.D.   On: 08/15/2015 17:19   X-rays reviewed and report per radiologist.  Visual Acuity Review  Right Eye Distance:   Left Eye Distance:   Bilateral Distance:    Right Eye Near:   Left Eye Near:    Bilateral Near:         MDM   1. Thumb sprain, right, initial encounter        Linna HoffJames D Kindl, MD 08/15/15 1759

## 2015-08-15 NOTE — ED Notes (Signed)
[facility] a complaint of a right hand injury secondary to a fall that occurred 4 days ago.

## 2015-08-15 NOTE — Discharge Instructions (Signed)
Soak in warm water 1-2 times a day, wear splint for comfort, return as needed.

## 2015-09-05 ENCOUNTER — Telehealth: Payer: Self-pay | Admitting: *Deleted

## 2015-09-05 NOTE — Telephone Encounter (Signed)
Hospital is calling to get a copy of consent (BTL) from office. We do not have on file- patient may have decided she wanted less than 31 days prior to surgery. LM on VM

## 2015-09-06 ENCOUNTER — Ambulatory Visit (INDEPENDENT_AMBULATORY_CARE_PROVIDER_SITE_OTHER): Payer: Medicaid Other | Admitting: Obstetrics

## 2015-09-06 ENCOUNTER — Encounter: Payer: Self-pay | Admitting: Obstetrics

## 2015-09-06 VITALS — BP 128/84 | HR 75 | Temp 98.8°F | Wt 316.0 lb

## 2015-09-06 DIAGNOSIS — Z1389 Encounter for screening for other disorder: Secondary | ICD-10-CM

## 2015-09-06 DIAGNOSIS — G8918 Other acute postprocedural pain: Secondary | ICD-10-CM

## 2015-09-06 DIAGNOSIS — M545 Low back pain: Secondary | ICD-10-CM | POA: Diagnosis not present

## 2015-09-06 LAB — POCT URINALYSIS DIPSTICK
Bilirubin, UA: NEGATIVE
Blood, UA: NEGATIVE
Glucose, UA: NEGATIVE
Ketones, UA: NEGATIVE
LEUKOCYTES UA: NEGATIVE
Nitrite, UA: NEGATIVE
PROTEIN UA: NEGATIVE
Spec Grav, UA: 1.015
UROBILINOGEN UA: NEGATIVE
pH, UA: 5

## 2015-09-06 MED ORDER — CYCLOBENZAPRINE HCL 10 MG PO TABS: 10.0000 mg | ORAL_TABLET | Freq: Three times a day (TID) | ORAL | Status: DC | PRN

## 2015-09-06 MED ORDER — OXYCODONE-ACETAMINOPHEN 10-325 MG PO TABS: 1.0000 | ORAL_TABLET | ORAL | Status: DC | PRN

## 2015-09-06 NOTE — Progress Notes (Signed)
Subjective:     [name] is a 35 y.o. female who presents for a postpartum visit. She is 6 weeks postpartum following a low cervical transverse Cesarean section. I have fully reviewed the prenatal and intrapartum course. The delivery was at 39 gestational weeks. Outcome: repeat cesarean section, low transverse incision and bilateral tubal ligation. Anesthesia: spinal. Postpartum course has been normal. Baby's course has been normal. Baby is feeding by breast. Bleeding no bleeding. Bowel function is normal. Bladder function is normal. Patient is not sexually active. Contraception method is tubal ligation. Postpartum depression screening: negative.  Tobacco, alcohol and substance abuse history reviewed.  Adult immunizations reviewed including TDAP, rubella and varicella.  The following portions of the patient's history were reviewed and updated as appropriate: allergies, current medications, past family history, past medical history, past social history, past surgical history and problem list.  Review of Systems A comprehensive review of systems was negative except for: Genitourinary: positive for frequency Musculoskeletal: positive for back pain   Objective:    BP 128/84 mmHg  Pulse 75  Temp(Src) 98.8 F (37.1 C)  Wt 316 lb (143.337 kg)  General:  alert and no distress   Breasts:  inspection negative, no nipple discharge or bleeding, no masses or nodularity palpable  Lungs: clear to auscultation bilaterally  Heart:  regular rate and rhythm, S1, S2 normal, no murmur, click, rub or gallop  Abdomen: normal findings: soft, non-tender   Vulva:  normal  Vagina: normal vagina  Cervix:  no cervical motion tenderness  Corpus: uterus is not felt due to obesity  Adnexa:  not felt due to obesity  Rectal Exam: Not performed.           Assessment:    6 weeks postpartum.  Doing well.  Wound disruption now well healed except for some granulation tissue which was cauterized with silver  nitrate.  Backache.  R/O UTI.   Plan:    1. Contraception: tubal ligation 2. Percocet / Flexeril Rx 3. Follow up in: 2 weeks or as needed.   Healthy lifestyle practices reviewed

## 2015-09-07 LAB — URINE CULTURE

## 2015-09-20 ENCOUNTER — Ambulatory Visit (INDEPENDENT_AMBULATORY_CARE_PROVIDER_SITE_OTHER): Payer: Medicaid Other | Admitting: Obstetrics

## 2015-09-20 ENCOUNTER — Encounter: Payer: Self-pay | Admitting: Obstetrics

## 2015-09-20 DIAGNOSIS — O900 Disruption of cesarean delivery wound: Secondary | ICD-10-CM

## 2015-09-20 NOTE — Progress Notes (Signed)
Subjective:     [name] is a 35 y.o. female who presents for a postpartum visit. She is 8 weeks postpartum following a low cervical transverse Cesarean section. I have fully reviewed the prenatal and intrapartum course. The delivery was at 39 gestational weeks. Outcome: repeat cesarean section, low transverse incision and BTL. Anesthesia: spinal. Postpartum course has been complicated by wound separation.. Baby's course has been normal. Baby is feeding by breast. Bleeding no bleeding. Bowel function is normal. Bladder function is normal. Patient is not sexually active. Contraception method is tubal ligation. Postpartum depression screening: negative.  Tobacco, alcohol and substance abuse history reviewed.  Adult immunizations reviewed including TDAP, rubella and varicella.  The following portions of the patient's history were reviewed and updated as appropriate: allergies, current medications, past family history, past medical history, past social history, past surgical history and problem list.  Review of Systems A comprehensive review of systems was negative.   Objective:    BP 127/74 mmHg  Pulse 68  Wt 314 lb (142.429 kg)  General:  alert and no distress   Breasts:  inspection negative, no nipple discharge or bleeding, no masses or nodularity palpable  Lungs: clear to auscultation bilaterally  Heart:  regular rate and rhythm, S1, S2 normal, no murmur, click, rub or gallop  Abdomen: normal findings: soft, non-tender and incision C,D, I.  Small area of granulation tissue cauterized wit silver nitrate.               Extremities:  No C, C, E.   Assessment:    Post op C/S wound separation.  Healing well by 2nd Intension after Wound Vac Management  Plan:    1. Contraception: tubal ligation 2. Continue wound management 3. Follow up in: 2 weeks or as needed.  2hr GTT for h/o GDM/screening for DM q 3 yrs per ADA recommendations Preconception counseling provided Healthy lifestyle  practices reviewed

## 2015-10-05 ENCOUNTER — Ambulatory Visit: Payer: Medicaid Other | Admitting: Obstetrics

## 2015-10-07 ENCOUNTER — Ambulatory Visit: Payer: Medicaid Other | Admitting: Obstetrics

## 2015-12-14 ENCOUNTER — Ambulatory Visit: Payer: Medicaid Other | Admitting: Obstetrics

## 2016-08-30 ENCOUNTER — Encounter (HOSPITAL_COMMUNITY): Payer: Self-pay | Admitting: *Deleted

## 2016-08-30 ENCOUNTER — Ambulatory Visit (HOSPITAL_COMMUNITY)
Admission: EM | Admit: 2016-08-30 | Discharge: 2016-08-30 | Disposition: A | Discharge status: Home / Self Care | Payer: Self-pay | Attending: Internal Medicine | Admitting: Internal Medicine

## 2016-08-30 DIAGNOSIS — M542 Cervicalgia: Secondary | ICD-10-CM

## 2016-08-30 DIAGNOSIS — M5489 Other dorsalgia: Secondary | ICD-10-CM

## 2016-08-30 DIAGNOSIS — M791 Myalgia: Secondary | ICD-10-CM

## 2016-08-30 DIAGNOSIS — T148XXA Other injury of unspecified body region, initial encounter: Secondary | ICD-10-CM

## 2016-08-30 MED ORDER — DICLOFENAC SODIUM 75 MG PO TBEC: 75.0000 mg | DELAYED_RELEASE_TABLET | Freq: Two times a day (BID) | ORAL | 0 refills | Status: DC

## 2016-08-30 MED ORDER — CYCLOBENZAPRINE HCL 10 MG PO TABS: 10.0000 mg | ORAL_TABLET | Freq: Two times a day (BID) | ORAL | 0 refills | Status: DC | PRN
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
## 2017-11-09 ENCOUNTER — Ambulatory Visit (HOSPITAL_COMMUNITY)
Admission: EM | Admit: 2017-11-09 | Discharge: 2017-11-09 | Disposition: A | Discharge status: Home / Self Care | Payer: Self-pay | Attending: Internal Medicine | Admitting: Internal Medicine

## 2017-11-09 ENCOUNTER — Other Ambulatory Visit: Payer: Self-pay

## 2017-11-09 ENCOUNTER — Encounter (HOSPITAL_COMMUNITY): Payer: Self-pay

## 2017-11-09 DIAGNOSIS — R7303 Prediabetes: Secondary | ICD-10-CM | POA: Insufficient documentation

## 2017-11-09 DIAGNOSIS — R300 Dysuria: Secondary | ICD-10-CM | POA: Insufficient documentation

## 2017-11-09 DIAGNOSIS — N390 Urinary tract infection, site not specified: Secondary | ICD-10-CM

## 2017-11-09 DIAGNOSIS — K219 Gastro-esophageal reflux disease without esophagitis: Secondary | ICD-10-CM | POA: Insufficient documentation

## 2017-11-09 LAB — POCT URINALYSIS DIP (DEVICE)
Bilirubin Urine: NEGATIVE
Glucose, UA: NEGATIVE mg/dL
HGB URINE DIPSTICK: NEGATIVE
KETONES UR: NEGATIVE mg/dL
Nitrite: NEGATIVE
PROTEIN: NEGATIVE mg/dL
Specific Gravity, Urine: >=1.03 (ref 1.005–1.030)
Urobilinogen, UA: 0.2 mg/dL (ref 0.0–1.0)
pH: 5.5 (ref 5.0–8.0)

## 2017-11-09 LAB — POCT PREGNANCY, URINE
Preg Test, Ur: NEGATIVE
Preg Test, Ur: NEGATIVE

## 2017-11-09 MED ORDER — NITROFURANTOIN MONOHYD MACRO 100 MG PO CAPS
100.0000 mg | ORAL_CAPSULE | Freq: Two times a day (BID) | ORAL | 0 refills | Status: DC
Start: 2017-11-09 — End: 2019-07-28

## 2017-11-09 MED ORDER — PHENAZOPYRIDINE HCL 100 MG PO TABS: 100.0000 mg | ORAL_TABLET | Freq: Three times a day (TID) | ORAL | 0 refills | Status: DC | PRN

## 2017-11-09 NOTE — ED Provider Notes (Addendum)
[facility]    CSN: [id number] Arrival date & time: 11/09/17  1012     History   Chief Complaint Chief Complaint  Patient presents with  . Dysuria    HPI [name] is a 37 y.o. female.   37 y.o. female presents with super pubic abdominal pain x 2 weeks with dysuria x 2 days and frank red blood noted after a bowel movement X 2 days.  Patient states initially she felt that it was due to eating bad food, however condition did not improve,  condition is persistent acute nature. Condition is made better by nothing condition is made worse by nothing patient denies any treatment prior to there arrival at this facility.  Patient endorses pain with urination, denies foul smell or increase in urination or vaginal discharge.  Patient reports having menstrual cycle approximately 2 weeks ago with no increased bleeding. Patient decline rectal vaginal exam.   Upon discharge patient is requesting STI testing.      Past Medical History:  Diagnosis Date  . Arthritis    "back; right foot; left ankle" (07/22/2014)  . Cellulitis of leg, right 07/2014  . Chronic lower back pain   . Morbid obesity with BMI of 50.0-59.9, adult (HCC)   . Vaginal delivery 1998    Patient Active Problem List   Diagnosis Date Noted  . S/P cesarean section 07/20/2015  . Gastroenteritis 06/16/2015  . Advanced maternal age in multigravida 01/17/2015  . [redacted] weeks gestation of pregnancy   . Anemia 07/24/2014  . Morbid obesity (HCC) 07/22/2014  . Cellulitis of right lower extremity 07/22/2014  . Prediabetes 07/22/2014  . Fissure in skin of foot 07/22/2014  . GERD without esophagitis 08/28/2012    Past Surgical History:  Procedure Laterality Date  . CESAREAN SECTION  1994; 2010  . CESAREAN SECTION N/A 07/20/2015   Procedure: REPEAT CESAREAN SECTION;  Surgeon: Brock Bad, MD;  Location: [facility];  Service: Obstetrics;  Laterality: N/A;  . DILATION AND CURETTAGE OF UTERUS  ~ 2008    OB History      Gravida  5   Para  4   Term  4   Preterm      AB  1   Living  4     SAB  1   TAB      Ectopic      Multiple  0   Live Births  4            Home Medications    Prior to Admission medications   Medication Sig Start Date End Date Taking? Authorizing Provider  cyclobenzaprine (FLEXERIL) 10 MG tablet Take 1 tablet (10 mg total) by mouth 2 (two) times daily as needed for muscle spasms. 08/30/16   Dorena Bodo, NP  diclofenac (VOLTAREN) 75 MG EC tablet Take 1 tablet (75 mg total) by mouth 2 (two) times daily. 08/30/16   Dorena Bodo, NP  Prenatal MV-Min-Fe Fum-FA-DHA (PRENATAL 1 PO) Take by mouth daily.    [provider]    Family History Family History  Problem Relation Age of Onset  . Hypertension Mother   . Diabetes Father   . Diabetes Maternal Grandfather     Social History Social History   Tobacco Use  . Smoking status: Never Smoker  . Smokeless tobacco: Never Used  Substance Use Topics  . Alcohol use: No    Alcohol/week: 0.0 standard drinks  . Drug use: No     Allergies  Penicillins and Tramadol hcl   Review of Systems Review of Systems  Constitutional: Negative for chills and fever.  HENT: Negative for ear pain and sore throat.   Eyes: Negative for pain and visual disturbance.  Respiratory: Negative for cough and shortness of breath.   Cardiovascular: Negative for chest pain and palpitations.  Gastrointestinal: Negative for abdominal pain and vomiting.  Genitourinary: Positive for dysuria. Negative for hematuria.  Musculoskeletal: Negative for arthralgias and back pain.  Skin: Negative for color change and rash.  Neurological: Negative for seizures and syncope.  All other systems reviewed and are negative.    Physical Exam Triage Vital Signs ED Triage Vitals  Enc Vitals Group     BP      Pulse      Resp      Temp      Temp src      SpO2      Weight      Height      Head Circumference      Peak Flow       Pain Score      Pain Loc      Pain Edu?      Excl. in GC?    No data found.  Updated Vital Signs There were no vitals taken for this visit.  Visual Acuity Right Eye Distance:   Left Eye Distance:   Bilateral Distance:    Right Eye Near:   Left Eye Near:    Bilateral Near:     Physical Exam  Constitutional: She is oriented to person, place, and time. She appears well-developed and well-nourished.  HENT:  Head: Normocephalic and atraumatic.  Eyes: Conjunctivae are normal.  Neck: Normal range of motion.  Pulmonary/Chest: Effort normal.  Abdominal: Soft. She exhibits no distension and no mass. There is no tenderness. There is no rebound and no guarding.  Hyperactive bowel sounds  Neurological: She is alert and oriented to person, place, and time.  Psychiatric: She has a normal mood and affect.  Nursing note and vitals reviewed.    UC Treatments / Results  Labs (all labs ordered are listed, but only abnormal results are displayed) Labs Reviewed - No data to display  EKG None  Radiology No results found.  Procedures Procedures (including critical care time)  Medications Ordered in UC Medications - No data to display  Initial Impression / Assessment and Plan / UC Course  I have reviewed the triage vital signs and the nursing notes.  Pertinent labs & imaging results that were available during my care of the patient were reviewed by me and considered in my medical decision making (see chart for details).      Final Clinical Impressions(s) / UC Diagnoses   Final diagnoses:  None   Discharge Instructions   None    ED Prescriptions    None     Controlled Substance Prescriptions [place] Controlled Substance Registry consulted? Not Applicable   Alene MiresOmohundro, Jennifer C, NP 11/09/17 1200    Alene Miresmohundro, Jennifer C, NP 11/09/17 [phone]

## 2017-11-09 NOTE — ED Triage Notes (Signed)
Pt presents today with dysuria and lower abdominal pain. States her abdomen has been hurting for 2 weeks but dysuria started 2 days ago. Has also had abnormal stools with some red tint as well as red tint on tissue after wiping. States stools are softer than usual as well but diet has not changed.

## 2017-11-11 LAB — URINE CYTOLOGY ANCILLARY ONLY
Chlamydia: NEGATIVE
Neisseria Gonorrhea: NEGATIVE
Trichomonas: POSITIVE — AB

## 2017-11-12 ENCOUNTER — Telehealth (HOSPITAL_COMMUNITY): Payer: Self-pay

## 2017-11-12 MED ORDER — METRONIDAZOLE 500 MG PO TABS: 2000.0000 mg | ORAL_TABLET | Freq: Once | ORAL | 0 refills | Status: AC

## 2017-11-12 NOTE — Telephone Encounter (Signed)
Trichomonas is positive. Rx metronidazole 2000 mg po once was sent to the pharmacy of record. PT called and made aware.  Educated patient to refrain from sexual intercourse for 7 days to give the medicine time to work. Sexual partners need to be notified and tested/treated. Condoms may reduce risk of reinfection. Recheck for further evaluation if symptoms are not improving. Pt verbalized understanding.

## 2018-10-23 DIAGNOSIS — U071 COVID-19: Secondary | ICD-10-CM | POA: Insufficient documentation

## 2018-10-24 ENCOUNTER — Emergency Department (HOSPITAL_COMMUNITY): Payer: Medicaid Other

## 2018-10-24 ENCOUNTER — Emergency Department (HOSPITAL_COMMUNITY)
Admission: EM | Admit: 2018-10-24 | Discharge: 2018-10-24 | Disposition: A | Discharge status: Home / Self Care | Payer: Medicaid Other | Attending: Emergency Medicine | Admitting: Emergency Medicine

## 2018-10-24 ENCOUNTER — Other Ambulatory Visit: Payer: Self-pay

## 2018-10-24 ENCOUNTER — Encounter (HOSPITAL_COMMUNITY): Payer: Self-pay

## 2018-10-24 DIAGNOSIS — U071 COVID-19: Secondary | ICD-10-CM | POA: Insufficient documentation

## 2018-10-24 DIAGNOSIS — R1030 Lower abdominal pain, unspecified: Secondary | ICD-10-CM

## 2018-10-24 DIAGNOSIS — R1110 Vomiting, unspecified: Secondary | ICD-10-CM | POA: Insufficient documentation

## 2018-10-24 LAB — URINALYSIS, ROUTINE W REFLEX MICROSCOPIC
Bacteria, UA: NONE SEEN
Bilirubin Urine: NEGATIVE
Glucose, UA: NEGATIVE mg/dL
Ketones, ur: 5 mg/dL — AB
Leukocytes,Ua: NEGATIVE
Nitrite: NEGATIVE
Protein, ur: 100 mg/dL — AB
Specific Gravity, Urine: 1.035 — ABNORMAL HIGH (ref 1.005–1.030)
pH: 6 (ref 5.0–8.0)

## 2018-10-24 LAB — COMPREHENSIVE METABOLIC PANEL
ALT: 25 U/L (ref 0–44)
AST: 27 U/L (ref 15–41)
Albumin: 3.5 g/dL (ref 3.5–5.0)
Alkaline Phosphatase: 50 U/L (ref 38–126)
Anion gap: 13 (ref 5–15)
BUN: 8 mg/dL (ref 6–20)
CO2: 21 mmol/L — ABNORMAL LOW (ref 22–32)
Calcium: 8.7 mg/dL — ABNORMAL LOW (ref 8.9–10.3)
Chloride: 108 mmol/L (ref 98–111)
Creatinine, Ser: 0.62 mg/dL (ref 0.44–1.00)
GFR calc Af Amer: >60 mL/min (ref >60)
GFR calc non Af Amer: >60 mL/min (ref >60)
Glucose, Bld: 103 mg/dL — ABNORMAL HIGH (ref 70–99)
Potassium: 3.7 mmol/L (ref 3.5–5.1)
Sodium: 142 mmol/L (ref 135–145)
Total Bilirubin: 0.2 mg/dL — ABNORMAL LOW (ref 0.3–1.2)
Total Protein: 7.6 g/dL (ref 6.5–8.1)

## 2018-10-24 LAB — CBC WITH DIFFERENTIAL/PLATELET
Abs Immature Granulocytes: 0.05 10*3/uL (ref 0.00–0.07)
Basophils Absolute: 0 10*3/uL (ref 0.0–0.1)
Basophils Relative: 0 %
Eosinophils Absolute: 0 10*3/uL (ref 0.0–0.5)
Eosinophils Relative: 0 %
HCT: 40 % (ref 36.0–46.0)
Hemoglobin: 12.5 g/dL (ref 12.0–15.0)
Immature Granulocytes: 1 %
Lymphocytes Relative: 25 %
Lymphs Abs: 1.2 10*3/uL (ref 0.7–4.0)
MCH: 25.9 pg — ABNORMAL LOW (ref 26.0–34.0)
MCHC: 31.3 g/dL (ref 30.0–36.0)
MCV: 83 fL (ref 80.0–100.0)
Monocytes Absolute: 0.4 10*3/uL (ref 0.1–1.0)
Monocytes Relative: 9 %
Neutro Abs: 3.2 10*3/uL (ref 1.7–7.7)
Neutrophils Relative %: 65 %
Platelets: 232 10*3/uL (ref 150–400)
RBC: 4.82 MIL/uL (ref 3.87–5.11)
RDW: 13.8 % (ref 11.5–15.5)
WBC: 5 10*3/uL (ref 4.0–10.5)
nRBC: 0 % (ref 0.0–0.2)

## 2018-10-24 LAB — TROPONIN I (HIGH SENSITIVITY): Troponin I (High Sensitivity): <2 ng/L (ref <18)

## 2018-10-24 LAB — HCG, QUANTITATIVE, PREGNANCY: hCG, Beta Chain, Quant, S: <1 m[IU]/mL (ref <5)

## 2018-10-24 LAB — LIPASE, BLOOD: Lipase: 21 U/L (ref 11–51)

## 2018-10-24 MED ORDER — ONDANSETRON HCL 4 MG/2ML IJ SOLN
4.0000 mg | Freq: Once | INTRAMUSCULAR | Status: AC
  Administered 2018-10-24: 4 mg via INTRAVENOUS
  Filled 2018-10-24: qty 2

## 2018-10-24 MED ORDER — SODIUM CHLORIDE 0.9 % IV BOLUS
1000.0000 mL | Freq: Once | INTRAVENOUS | Status: AC
  Administered 2018-10-24: 1000 mL via INTRAVENOUS

## 2018-10-24 MED ORDER — IOHEXOL 300 MG/ML  SOLN
100.0000 mL | Freq: Once | INTRAMUSCULAR | Status: AC | PRN
  Administered 2018-10-24: 100 mL via INTRAVENOUS

## 2018-10-24 MED ORDER — HYDROMORPHONE HCL 1 MG/ML IJ SOLN
1.0000 mg | Freq: Once | INTRAMUSCULAR | Status: AC
  Administered 2018-10-24: 1 mg via INTRAVENOUS
  Filled 2018-10-24: qty 1

## 2018-10-24 MED ORDER — ONDANSETRON 4 MG PO TBDP: 4.0000 mg | ORAL_TABLET | Freq: Three times a day (TID) | ORAL | 0 refills | Status: DC | PRN

## 2018-10-24 NOTE — Discharge Instructions (Signed)
If you develop worsening, continued, or recurrent abdominal pain, uncontrolled vomiting, fever, chest or back pain, or any other new/concerning symptoms then return to the ER for evaluation.  

## 2018-10-24 NOTE — ED Provider Notes (Signed)
[facility] DEPT Provider Note   CSN: [id number] Arrival date & time: 10/24/18  1417    History   Chief Complaint Chief Complaint  Patient presents with   Abdominal Pain   COVID +    HPI [name] is a 38 y.o. female.     HPI  38 year old female presents with abdominal pain and vomiting.  About an hour after taking multiple vitamins this morning she developed lower abdominal pain.  She has had multiple episodes of emesis.  No hematemesis.  The patient was discharged from [facility] for COVID-19 yesterday.  A little bit of shortness of breath and sore throat.  Some chest pain.  No fevers at this time.  No urinary or vaginal symptoms.  The pain is severe. No diarrhea/constipation.  Past Medical History:  Diagnosis Date   Arthritis    "back; right foot; left ankle" (07/22/2014)   Cellulitis of leg, right 07/2014   Chronic lower back pain    Morbid obesity with BMI of 50.0-59.9, adult [facility])    Vaginal delivery 1998    Patient Active Problem List   Diagnosis Date Noted   S/P cesarean section 07/20/2015   Gastroenteritis 06/16/2015   Advanced maternal age in multigravida 01/17/2015   [redacted] weeks gestation of pregnancy    Anemia 07/24/2014   Morbid obesity ([place]) 07/22/2014   Cellulitis of right lower extremity 07/22/2014   Prediabetes 07/22/2014   Fissure in skin of foot 07/22/2014   GERD without esophagitis 08/28/2012    Past Surgical History:  Procedure Laterality Date   CESAREAN SECTION  1994; 2010   CESAREAN SECTION N/A 07/20/2015   Procedure: REPEAT CESAREAN SECTION;  Surgeon: Shelly Bombard, MD;  Location: [facility];  Service: Obstetrics;  Laterality: N/A;   DILATION AND CURETTAGE OF UTERUS  ~ 2008     OB History    Gravida  5   Para  4   Term  4   Preterm      AB  1   Living  4     SAB  1   TAB      Ectopic      Multiple  0   Live Births  4            Home Medications    Prior to  Admission medications   Medication Sig Start Date End Date Taking? Authorizing Provider  acetaminophen (TYLENOL) 325 MG tablet Take 650 mg by mouth every 6 (six) hours as needed for fever or headache.   Yes [provider]  cyclobenzaprine (FLEXERIL) 10 MG tablet Take 1 tablet (10 mg total) by mouth 2 (two) times daily as needed for muscle spasms. Patient not taking: Reported on 10/24/2018 08/30/16   Barnet Glasgow, NP  diclofenac (VOLTAREN) 75 MG EC tablet Take 1 tablet (75 mg total) by mouth 2 (two) times daily. Patient not taking: Reported on 10/24/2018 08/30/16   Barnet Glasgow, NP  nitrofurantoin, macrocrystal-monohydrate, (MACROBID) 100 MG capsule Take 1 capsule (100 mg total) by mouth 2 (two) times daily. Patient not taking: Reported on 10/24/2018 11/09/17   Jacqualine Mau, NP  ondansetron (ZOFRAN ODT) 4 MG disintegrating tablet Take 1 tablet (4 mg total) by mouth every 8 (eight) hours as needed for nausea or vomiting. 10/24/18   Sherwood Gambler, MD  phenazopyridine (PYRIDIUM) 100 MG tablet Take 1 tablet (100 mg total) by mouth 3 (three) times daily as needed for pain. Patient not taking: Reported on 10/24/2018  11/09/17   Alene Miresmohundro, Jennifer C, NP    Family History Family History  Problem Relation Age of Onset   Hypertension Mother    Diabetes Father    Diabetes Maternal Grandfather     Social History Social History   Tobacco Use   Smoking status: Never Smoker   Smokeless tobacco: Never Used  Substance Use Topics   Alcohol use: No    Alcohol/week: 0.0 standard drinks   Drug use: No     Allergies   Penicillins, Tramadol hcl, and Penicillin g   Review of Systems Review of Systems  Constitutional: Negative for fever.  Respiratory: Positive for cough and shortness of breath.   Cardiovascular: Positive for chest pain.  Gastrointestinal: Positive for abdominal pain and vomiting. Negative for constipation.  Genitourinary: Negative for dysuria and  vaginal bleeding.  All other systems reviewed and are negative.    Physical Exam Updated Vital Signs BP (!) 134/101    Pulse 95    Temp 98.4 F (36.9 C) (Oral)    Resp 17    Ht 5\' 3"  (1.6 m)    Wt (!) 154.2 kg    SpO2 100%    BMI 60.23 kg/m   Physical Exam Vitals signs and nursing note reviewed.  Constitutional:      Appearance: She is well-developed. She is obese.  HENT:     Head: Normocephalic and atraumatic.     Right Ear: External ear normal.     Left Ear: External ear normal.     Nose: Nose normal.  Eyes:     General:        Right eye: No discharge.        Left eye: No discharge.  Cardiovascular:     Rate and Rhythm: Normal rate and regular rhythm.     Heart sounds: Normal heart sounds.  Pulmonary:     Effort: Pulmonary effort is normal.     Breath sounds: Normal breath sounds.  Abdominal:     Palpations: Abdomen is soft.     Tenderness: There is abdominal tenderness in the left lower quadrant. There is no right CVA tenderness or left CVA tenderness.  Skin:    General: Skin is warm and dry.  Neurological:     Mental Status: She is alert.  Psychiatric:        Mood and Affect: Mood is not anxious.      ED Treatments / Results  Labs (all labs ordered are listed, but only abnormal results are displayed) Labs Reviewed  COMPREHENSIVE METABOLIC PANEL - Abnormal; Notable for the following components:      Result Value   CO2 21 (*)    Glucose, Bld 103 (*)    Calcium 8.7 (*)    Total Bilirubin 0.2 (*)    All other components within normal limits  URINALYSIS, ROUTINE W REFLEX MICROSCOPIC - Abnormal; Notable for the following components:   APPearance HAZY (*)    Specific Gravity, Urine 1.035 (*)    Hgb urine dipstick SMALL (*)    Ketones, ur 5 (*)    Protein, ur 100 (*)    All other components within normal limits  CBC WITH DIFFERENTIAL/PLATELET - Abnormal; Notable for the following components:   MCH 25.9 (*)    All other components within normal limits    LIPASE, BLOOD  HCG, QUANTITATIVE, PREGNANCY  CBC WITH DIFFERENTIAL/PLATELET  TROPONIN I (HIGH SENSITIVITY)    EKG EKG Interpretation  Date/Time:  Friday October 24 2018 16:01:27 EDT  Ventricular Rate:  89 PR Interval:    QRS Duration: 83 QT Interval:  348 QTC Calculation: 424 R Axis:   44 Text Interpretation:  Sinus rhythm no acute ST/T changes no significant change since APril 2016 Confirmed by Pricilla LovelessGoldston, Roseline Ebarb [phone]([id number]) on 10/24/2018 4:04:56 PM   Radiology Ct Abdomen Pelvis W Contrast  Result Date: 10/24/2018 CLINICAL DATA:  Abdominal pain, diverticulitis suspected, positive COVID-19 EXAM: CT ABDOMEN AND PELVIS WITH CONTRAST TECHNIQUE: Multidetector CT imaging of the abdomen and pelvis was performed using the standard protocol following bolus administration of intravenous contrast. CONTRAST:  100mL OMNIPAQUE IOHEXOL 300 MG/ML  SOLN COMPARISON:  06/11/2009 FINDINGS: Lower chest: Extensive irregular, consolidative, and ground-glass opacity of the included bilateral lung bases. Small, benign pulmonary nodules of the left lung base unchanged from prior examination dated 2011. Hepatobiliary: No solid liver abnormality is seen. No gallstones, gallbladder wall thickening, or biliary dilatation. Pancreas: Unremarkable. No pancreatic ductal dilatation or surrounding inflammatory changes. Spleen: Normal in size without significant abnormality. Adrenals/Urinary Tract: Adrenal glands are unremarkable. Kidneys are normal, without renal calculi, solid lesion, or hydronephrosis. Bladder is unremarkable. Stomach/Bowel: Stomach is within normal limits. Appendix appears normal. No evidence of bowel wall thickening, distention, or inflammatory changes. Vascular/Lymphatic: No significant vascular findings are present. No enlarged abdominal or pelvic lymph nodes. Reproductive: No mass or other significant abnormality. Other: No abdominal wall hernia or abnormality. No abdominopelvic ascites. Musculoskeletal: No acute  or significant osseous findings. IMPRESSION: 1. No acute CT findings of the abdomen or pelvis to explain abdominal pain. No evidence of significant diverticular disease or diverticulitis. 2. Extensive irregular, consolidative, and ground-glass opacity of the included bilateral lung bases, in keeping with COVID-19 diagnosis. Electronically Signed   By: Lauralyn PrimesAlex  Bibbey M.D.   On: 10/24/2018 18:38   Dg Chest Portable 1 View  Result Date: 10/24/2018 CLINICAL DATA:  Chest pain and upper abdominal pain. EXAM: PORTABLE CHEST 1 VIEW COMPARISON:  10/21/2018 and 10/15/2018 FINDINGS: The bilateral lower lobe pulmonary infiltrates noted on the prior study have improved. Heart size and pulmonary vascularity are normal considering the AP portable technique. Bones are normal. IMPRESSION: Improved bilateral pulmonary infiltrates with only minimal residual at both lung bases. Electronically Signed   By: Francene BoyersJames  Maxwell M.D.   On: 10/24/2018 17:35    Procedures Procedures (including critical care time)  Medications Ordered in ED Medications  ondansetron (ZOFRAN) injection 4 mg (has no administration in time range)  ondansetron (ZOFRAN) injection 4 mg (4 mg Intravenous Given 10/24/18 1653)  sodium chloride 0.9 % bolus 1,000 mL (0 mLs Intravenous Stopped 10/24/18 2034)  HYDROmorphone (DILAUDID) injection 1 mg (1 mg Intravenous Given 10/24/18 1654)  iohexol (OMNIPAQUE) 300 MG/ML solution 100 mL (100 mLs Intravenous Contrast Given 10/24/18 1817)     Initial Impression / Assessment and Plan / ED Course  I have reviewed the triage vital signs and the nursing notes.  Pertinent labs & imaging results that were available during my care of the patient were reviewed by me and considered in my medical decision making (see chart for details).        Patient's labs are reassuring.  No UTI.  CT is overall unremarkable.  Her abdominal pain completely resolved.  Given this resolution, my suspicion for something like ovarian  torsion is pretty low.  Probably related to the multiple vitamin she took for the first time this morning.  I discussed she should stop these for now.  As far as her COVID-19 symptoms, these seem to be improving  overall.  She is not hypoxic or short of breath.  Will discharge with nausea medicine and we discussed return precautions.  [name] was evaluated in Emergency Department on 10/24/2018 for the symptoms described in the history of present illness. She was evaluated in the context of the global COVID-19 pandemic, which necessitated consideration that the patient might be at risk for infection with the SARS-CoV-2 virus that causes COVID-19. Institutional protocols and algorithms that pertain to the evaluation of patients at risk for COVID-19 are in a state of rapid change based on information released by regulatory bodies including the CDC and federal and state organizations. These policies and algorithms were followed during the patient's care in the ED.   Final Clinical Impressions(s) / ED Diagnoses   Final diagnoses:  Lower abdominal pain  Vomiting in adult    ED Discharge Orders         Ordered    ondansetron (ZOFRAN ODT) 4 MG disintegrating tablet  Every 8 hours PRN     10/24/18 2148           Pricilla LovelessGoldston, Brek Reece, MD 10/24/18 2149

## 2018-10-24 NOTE — ED Triage Notes (Signed)
Per EMS pt tested postive for COVID 1 week ago. Pt took many vitamins today that she recently purchased due to her diagnosis and has severe abd pain and vomiting. Pt not complaining of any COVID like symptoms.  Temp: 98.1 130/100 Pulse 80 RR 22  94 %RA Cbg 121

## 2019-07-13 DIAGNOSIS — F321 Major depressive disorder, single episode, moderate: Secondary | ICD-10-CM | POA: Diagnosis not present

## 2019-07-23 ENCOUNTER — Other Ambulatory Visit: Payer: Self-pay

## 2019-07-23 ENCOUNTER — Emergency Department (INDEPENDENT_AMBULATORY_CARE_PROVIDER_SITE_OTHER)
Admission: EM | Admit: 2019-07-23 | Discharge: 2019-07-23 | Disposition: A | Discharge status: Home / Self Care | Payer: Medicaid Other | Source: Home / Self Care

## 2019-07-23 DIAGNOSIS — L03115 Cellulitis of right lower limb: Secondary | ICD-10-CM

## 2019-07-23 DIAGNOSIS — L089 Local infection of the skin and subcutaneous tissue, unspecified: Secondary | ICD-10-CM

## 2019-07-23 MED ORDER — DOXYCYCLINE HYCLATE 100 MG PO CAPS: 100.0000 mg | ORAL_CAPSULE | Freq: Two times a day (BID) | ORAL | 0 refills | Status: DC

## 2019-07-23 MED ORDER — IBUPROFEN 600 MG PO TABS
600.0000 mg | ORAL_TABLET | Freq: Once | ORAL | Status: AC
  Administered 2019-07-23: 600 mg via ORAL

## 2019-07-23 MED ORDER — IBUPROFEN 200 MG PO TABS: 400.0000 mg | ORAL_TABLET | Freq: Four times a day (QID) | ORAL | 0 refills | Status: DC | PRN

## 2019-07-23 NOTE — ED Provider Notes (Signed)
[name] CARE    CSN: [id number] Arrival date & time: 07/23/19  1949      History   Chief Complaint No chief complaint on file.   HPI [name] is a 39 y.o. female.   HPI  [name] is a 39 y.o. female presenting to UC with c/o 3 days of gradually worsening Right lower leg pain, redness, and swelling with a red bump that initially looked like an insect bite.  She does not recall seeing any insects. Pain is 5/10. Denies fever, chills, n/v/d. Hx of cellulitis of the same leg over 4 years ago.  That infection was much more extensive, resulting in her spending about 1 week in the hospital.  Denies hx of diabetes. She does not have a PCP.    Past Medical History:  Diagnosis Date  . Arthritis    "back; right foot; left ankle" (07/22/2014)  . Cellulitis of leg, right 07/2014  . Chronic lower back pain   . Morbid obesity with BMI of 50.0-59.9, adult ([facility])   . Vaginal delivery 1998    Patient Active Problem List   Diagnosis Date Noted  . S/P cesarean section 07/20/2015  . Gastroenteritis 06/16/2015  . Advanced maternal age in multigravida 01/17/2015  . [redacted] weeks gestation of pregnancy   . Anemia 07/24/2014  . Morbid obesity ([facility]) 07/22/2014  . Cellulitis of right lower extremity 07/22/2014  . Prediabetes 07/22/2014  . Fissure in skin of foot 07/22/2014  . GERD without esophagitis 08/28/2012    Past Surgical History:  Procedure Laterality Date  . CESAREAN SECTION  1994; 2010  . CESAREAN SECTION N/A 07/20/2015   Procedure: REPEAT CESAREAN SECTION;  Surgeon: Brock Bad, MD;  Location: [facility];  Service: Obstetrics;  Laterality: N/A;  . DILATION AND CURETTAGE OF UTERUS  ~ 2008    OB History    Gravida  5   Para  4   Term  4   Preterm      AB  1   Living  4     SAB  1   TAB      Ectopic      Multiple  0   Live Births  4            Home Medications    Prior to Admission medications   Medication Sig Start Date End Date Taking?  Authorizing Provider  acetaminophen (TYLENOL) 325 MG tablet Take 650 mg by mouth every 6 (six) hours as needed for fever or headache.    [provider]  cyclobenzaprine (FLEXERIL) 10 MG tablet Take 1 tablet (10 mg total) by mouth 2 (two) times daily as needed for muscle spasms. Patient not taking: Reported on 10/24/2018 08/30/16   Dorena Bodo, NP  diclofenac (VOLTAREN) 75 MG EC tablet Take 1 tablet (75 mg total) by mouth 2 (two) times daily. Patient not taking: Reported on 10/24/2018 08/30/16   Dorena Bodo, NP  doxycycline (VIBRAMYCIN) 100 MG capsule Take 1 capsule (100 mg total) by mouth 2 (two) times daily. One po bid x 7 days 07/23/19   Lurene Shadow, PA-C  nitrofurantoin, macrocrystal-monohydrate, (MACROBID) 100 MG capsule Take 1 capsule (100 mg total) by mouth 2 (two) times daily. Patient not taking: Reported on 10/24/2018 11/09/17   Alene Mires, NP  ondansetron (ZOFRAN ODT) 4 MG disintegrating tablet Take 1 tablet (4 mg total) by mouth every 8 (eight) hours as needed for nausea or vomiting. 10/24/18   Criss Alvine,  Nicki Reaper, MD  phenazopyridine (PYRIDIUM) 100 MG tablet Take 1 tablet (100 mg total) by mouth 3 (three) times daily as needed for pain. Patient not taking: Reported on 10/24/2018 11/09/17   Jacqualine Mau, NP    Family History Family History  Problem Relation Age of Onset  . Hypertension Mother   . Diabetes Father   . Diabetes Maternal Grandfather     Social History Social History   Tobacco Use  . Smoking status: Never Smoker  . Smokeless tobacco: Never Used  Substance Use Topics  . Alcohol use: No    Alcohol/week: 0.0 standard drinks  . Drug use: No     Allergies   Penicillins, Tramadol hcl, and Penicillin g   Review of Systems Review of Systems  Constitutional: Negative for chills and fever.  Gastrointestinal: Negative for diarrhea, nausea and vomiting.  Skin: Positive for color change and wound.     Physical Exam Triage Vital  Signs ED Triage Vitals  Enc Vitals Group     BP 07/23/19 2002 (!) 155/85     Pulse Rate 07/23/19 1959 64     Resp 07/23/19 1959 18     Temp 07/23/19 1959 98.4 F (36.9 C)     Temp src --      SpO2 07/23/19 1959 100 %     Weight --      Height --      Head Circumference --      Peak Flow --      Pain Score 07/23/19 2004 5     Pain Loc --      Pain Edu? --      Excl. in [place]? --    No data found.  Updated Vital Signs BP (!) 155/85 (BP Location: Right Wrist)   Pulse 64   Temp 98.4 F (36.9 C)   Resp 18   SpO2 100%   Visual Acuity Right Eye Distance:   Left Eye Distance:   Bilateral Distance:    Right Eye Near:   Left Eye Near:    Bilateral Near:     Physical Exam Vitals and nursing note reviewed.  Constitutional:      Appearance: She is well-developed.  HENT:     Head: Normocephalic and atraumatic.  Cardiovascular:     Rate and Rhythm: Normal rate.  Pulmonary:     Effort: Pulmonary effort is normal.  Musculoskeletal:        General: Tenderness present. Normal range of motion.     Cervical back: Normal range of motion.     Right lower leg: Tenderness present.       Legs:  Skin:    General: Skin is warm and dry.  Neurological:     Mental Status: She is alert and oriented to person, place, and time.  Psychiatric:        Behavior: Behavior normal.      UC Treatments / Results  Labs (all labs ordered are listed, but only abnormal results are displayed) Labs Reviewed - No data to display  EKG   Radiology No results found.  Procedures Procedures (including critical care time)  Medications Ordered in UC Medications - No data to display  Initial Impression / Assessment and Plan / UC Course  I have reviewed the triage vital signs and the nursing notes.  Pertinent labs & imaging results that were available during my care of the patient were reviewed by me and considered in my medical decision making (see chart for details).  Hx and exam c/w  cellulitis, possibly infected insect bite. Doubt DVT  Will tx with doxycycline  AVS provided  Final Clinical Impressions(s) / UC Diagnoses   Final diagnoses:  Cellulitis of right lower leg  Skin pustule     Discharge Instructions      Keep area clean with warm water and mild soap. You can use warm damp wash cloth to encourage drainage if there is pus wanting to come out.  Please take antibiotics as prescribed and be sure to complete entire course even if you start to feel better to ensure infection does not come back.  Please follow up with family medicine or return to urgent care if not improving in 3-4 days, sooner if symptoms worsening.     ED Prescriptions    Medication Sig Dispense Auth. Provider   doxycycline (VIBRAMYCIN) 100 MG capsule Take 1 capsule (100 mg total) by mouth 2 (two) times daily. One po bid x 7 days 14 capsule Lurene Shadow, [place]     PDMP not reviewed this encounter.   Lurene Shadow, [place] 07/23/19 2019

## 2019-07-23 NOTE — ED Triage Notes (Signed)
Red spot on RT lower leg. Redness and swelling, hot to touch.

## 2019-07-23 NOTE — Discharge Instructions (Signed)
  Keep area clean with warm water and mild soap. You can use warm damp wash cloth to encourage drainage if there is pus wanting to come out.  Please take antibiotics as prescribed and be sure to complete entire course even if you start to feel better to ensure infection does not come back.  Please follow up with family medicine or return to urgent care if not improving in 3-4 days, sooner if symptoms worsening.

## 2019-07-28 ENCOUNTER — Other Ambulatory Visit: Payer: Self-pay

## 2019-07-28 ENCOUNTER — Ambulatory Visit (HOSPITAL_COMMUNITY)
Admission: EM | Admit: 2019-07-28 | Discharge: 2019-07-28 | Disposition: A | Discharge status: Home / Self Care | Payer: Medicaid Other | Attending: Family Medicine | Admitting: Family Medicine

## 2019-07-28 DIAGNOSIS — Z5189 Encounter for other specified aftercare: Secondary | ICD-10-CM | POA: Diagnosis not present

## 2019-07-28 MED ORDER — DOXYCYCLINE HYCLATE 100 MG PO CAPS: 100.0000 mg | ORAL_CAPSULE | Freq: Two times a day (BID) | ORAL | 0 refills | Status: AC

## 2019-07-28 NOTE — ED Triage Notes (Signed)
Pt has been on antibiotics since 07/23/19 for cellulitis to right lower leg, here for recheck. Pt states cellulitis has improved

## 2019-07-28 NOTE — Discharge Instructions (Addendum)
Wound appears to be healing well.  Keep taking the medications as prescribed Follow up as needed for continued or worsening symptoms

## 2019-07-28 NOTE — ED Provider Notes (Signed)
[place]    CSN: [id number] Arrival date & time: 07/28/19  0850      History   Chief Complaint Chief Complaint  Patient presents with  . Wound Check    HPI [name] is a 39 y.o. female.   Patient is a 39 year old female with past medical history of arthritis, cellulitis, morbid obesity.  She presents today for wound check to right lower extremity.  She was seen approximately 5 days ago and treated for cellulitis with doxycycline.  She has been taking the medication as prescribed.  She feels like her symptoms are improving.  Still some mild erythema to the area and pain.  Reporting the swelling recently improved.  No fever, chills, body aches, night sweats.  ROS per HPI      Past Medical History:  Diagnosis Date  . Arthritis    "back; right foot; left ankle" (07/22/2014)  . Cellulitis of leg, right 07/2014  . Chronic lower back pain   . Morbid obesity with BMI of 50.0-59.9, adult ([place])   . Vaginal delivery 1998    Patient Active Problem List   Diagnosis Date Noted  . S/P cesarean section 07/20/2015  . Gastroenteritis 06/16/2015  . Advanced maternal age in multigravida 01/17/2015  . [redacted] weeks gestation of pregnancy   . Anemia 07/24/2014  . Morbid obesity ([place]) 07/22/2014  . Cellulitis of right lower extremity 07/22/2014  . Prediabetes 07/22/2014  . Fissure in skin of foot 07/22/2014  . GERD without esophagitis 08/28/2012    Past Surgical History:  Procedure Laterality Date  . CESAREAN SECTION  1994; 2010  . CESAREAN SECTION N/A 07/20/2015   Procedure: REPEAT CESAREAN SECTION;  Surgeon: Shelly Bombard, MD;  Location: [facility];  Service: Obstetrics;  Laterality: N/A;  . DILATION AND CURETTAGE OF UTERUS  ~ 2008    OB History    Gravida  5   Para  4   Term  4   Preterm      AB  1   Living  4     SAB  1   TAB      Ectopic      Multiple  0   Live Births  4            Home Medications    Prior to Admission medications    Medication Sig Start Date End Date Taking? Authorizing Provider  acetaminophen (TYLENOL) 325 MG tablet Take 650 mg by mouth every 6 (six) hours as needed for fever or headache.    [provider]  doxycycline (VIBRAMYCIN) 100 MG capsule Take 1 capsule (100 mg total) by mouth 2 (two) times daily. One po bid x 7 days 07/23/19   Noe Gens, PA-C  doxycycline (VIBRAMYCIN) 100 MG capsule Take 1 capsule (100 mg total) by mouth 2 (two) times daily for 3 days. 07/28/19 07/31/19  Loura Halt A, NP  ibuprofen (ADVIL) 200 MG tablet Take 2-3 tablets (400-600 mg total) by mouth every 6 (six) hours as needed for moderate pain. 07/23/19   Noe Gens, PA-C    Family History Family History  Problem Relation Age of Onset  . Hypertension Mother   . Diabetes Father   . Diabetes Maternal Grandfather     Social History Social History   Tobacco Use  . Smoking status: Never Smoker  . Smokeless tobacco: Never Used  Substance Use Topics  . Alcohol use: No    Alcohol/week: 0.0 standard drinks  .  Drug use: No     Allergies   Penicillins, Tramadol hcl, and Penicillin g   Review of Systems Review of Systems   Physical Exam Triage Vital Signs ED Triage Vitals  Enc Vitals Group     BP 07/28/19 0919 128/77     Pulse Rate 07/28/19 0919 64     Resp 07/28/19 0919 16     Temp 07/28/19 0919 (!) 97.5 F (36.4 C)     Temp src --      SpO2 07/28/19 0919 99 %     Weight --      Height --      Head Circumference --      Peak Flow --      Pain Score 07/28/19 0920 6     Pain Loc --      Pain Edu? --      Excl. in GC? --    No data found.  Updated Vital Signs BP 128/77   Pulse 64   Temp (!) 97.5 F (36.4 C)   Resp 16   SpO2 99%   Visual Acuity Right Eye Distance:   Left Eye Distance:   Bilateral Distance:    Right Eye Near:   Left Eye Near:    Bilateral Near:     Physical Exam Vitals and nursing note reviewed.  Constitutional:      General: She is not in acute distress.     Appearance: Normal appearance. She is not ill-appearing, toxic-appearing or diaphoretic.  HENT:     Head: Normocephalic.     Nose: Nose normal.  Eyes:     Conjunctiva/sclera: Conjunctivae normal.  Pulmonary:     Effort: Pulmonary effort is normal.  Musculoskeletal:        General: Normal range of motion.     Cervical back: Normal range of motion.  Skin:    General: Skin is warm and dry.     Findings: No rash.     Comments: Mild erythema to the RLE with open wound. No drainage.  No increased warmth.  Mildly tender.    Neurological:     Mental Status: She is alert.  Psychiatric:        Mood and Affect: Mood normal.      UC Treatments / Results  Labs (all labs ordered are listed, but only abnormal results are displayed) Labs Reviewed - No data to display  EKG   Radiology No results found.  Procedures Procedures (including critical care time)  Medications Ordered in UC Medications - No data to display  Initial Impression / Assessment and Plan / UC Course  I have reviewed the triage vital signs and the nursing notes.  Pertinent labs & imaging results that were available during my care of the patient were reviewed by me and considered in my medical decision making (see chart for details).     Wound check Nothing concerning on recheck today.  Patient reports that the swelling and redness has improved.  She is still concerned about having mild pain.  Recommended doing Tylenol ibuprofen for pain.  Warm compresses.  Patient worried that she is almost out of medication and feels like she may need more. We will extend the doxycycline for 10 days based on her concern. Follow up as needed for continued or worsening symptoms  Final Clinical Impressions(s) / UC Diagnoses   Final diagnoses:  Visit for wound check     Discharge Instructions     Wound appears to be healing  well.  Keep taking the medications as prescribed Follow up as needed for continued or  worsening symptoms      ED Prescriptions    Medication Sig Dispense Auth. Provider   doxycycline (VIBRAMYCIN) 100 MG capsule Take 1 capsule (100 mg total) by mouth 2 (two) times daily for 3 days. 6 capsule Qamar Rosman A, NP     PDMP not reviewed this encounter.   Dahlia Byes A, NP 07/28/19 1007

## 2019-12-11 ENCOUNTER — Other Ambulatory Visit: Payer: Self-pay

## 2019-12-11 ENCOUNTER — Encounter: Payer: Self-pay | Admitting: Emergency Medicine

## 2019-12-11 ENCOUNTER — Emergency Department (INDEPENDENT_AMBULATORY_CARE_PROVIDER_SITE_OTHER): Payer: Medicaid Other

## 2019-12-11 ENCOUNTER — Emergency Department
Admission: EM | Admit: 2019-12-11 | Discharge: 2019-12-11 | Disposition: A | Discharge status: Home / Self Care | Payer: Medicaid Other | Source: Home / Self Care

## 2019-12-11 DIAGNOSIS — R05 Cough: Secondary | ICD-10-CM

## 2019-12-11 DIAGNOSIS — Z8616 Personal history of COVID-19: Secondary | ICD-10-CM

## 2019-12-11 DIAGNOSIS — J069 Acute upper respiratory infection, unspecified: Secondary | ICD-10-CM | POA: Diagnosis not present

## 2019-12-11 DIAGNOSIS — R0602 Shortness of breath: Secondary | ICD-10-CM

## 2019-12-11 DIAGNOSIS — J029 Acute pharyngitis, unspecified: Secondary | ICD-10-CM | POA: Diagnosis not present

## 2019-12-11 DIAGNOSIS — J189 Pneumonia, unspecified organism: Secondary | ICD-10-CM | POA: Diagnosis not present

## 2019-12-11 DIAGNOSIS — R519 Headache, unspecified: Secondary | ICD-10-CM | POA: Diagnosis not present

## 2019-12-11 LAB — POC SARS CORONAVIRUS 2 AG -  ED: SARS Coronavirus 2 Ag: NEGATIVE

## 2019-12-11 MED ORDER — BENZONATATE 100 MG PO CAPS: 100.0000 mg | ORAL_CAPSULE | Freq: Three times a day (TID) | ORAL | 0 refills | Status: DC

## 2019-12-11 MED ORDER — DOXYCYCLINE HYCLATE 100 MG PO CAPS: 100.0000 mg | ORAL_CAPSULE | Freq: Two times a day (BID) | ORAL | 0 refills | Status: DC

## 2019-12-11 MED ORDER — METHYLPREDNISOLONE SODIUM SUCC 40 MG IJ SOLR
80.0000 mg | Freq: Once | INTRAMUSCULAR | Status: AC
  Administered 2019-12-11: 80 mg via INTRAMUSCULAR

## 2019-12-11 MED ORDER — ALBUTEROL SULFATE HFA 108 (90 BASE) MCG/ACT IN AERS: 1.0000 | INHALATION_SPRAY | Freq: Four times a day (QID) | RESPIRATORY_TRACT | 0 refills | Status: DC | PRN

## 2019-12-11 NOTE — ED Provider Notes (Signed)
[name]    CSN: [id number] Arrival date & time: 12/11/19  1623      History   Chief Complaint Chief Complaint  Patient presents with  . Sore Throat  . Fatigue    HPI [name] is a 39 y.o. female.   HPI  [name] is a 39 y.o. female presenting to UC with c/o 3 days of gradually worsening generalized fatigue, SOB, sore throat since returning from [place] where she was with her husband who had surgery for cancer.  Pt reports having COVID last July in 2020, had to be hospitalized.  Pt states she eventually felt back to "normal" and felt well until this Tuesday.  Hx of asthma as a child but does not currently have an inhaler at home.  Pt has not received the COVID vaccine, was advised by her PCP her illness was so bad her antibodies from the virus would protect her from future infection. Pt currently looking for a new PCP.   Past Medical History:  Diagnosis Date  . Arthritis    "back; right foot; left ankle" (07/22/2014)  . Cellulitis of leg, right 07/2014  . Chronic lower back pain   . Morbid obesity with BMI of 50.0-59.9, adult ([facility])   . Vaginal delivery 1998    Patient Active Problem List   Diagnosis Date Noted  . S/P cesarean section 07/20/2015  . Gastroenteritis 06/16/2015  . Advanced maternal age in multigravida 01/17/2015  . [redacted] weeks gestation of pregnancy   . Anemia 07/24/2014  . Morbid obesity ([facility]) 07/22/2014  . Cellulitis of right lower extremity 07/22/2014  . Prediabetes 07/22/2014  . Fissure in skin of foot 07/22/2014  . GERD without esophagitis 08/28/2012    Past Surgical History:  Procedure Laterality Date  . CESAREAN SECTION  1994; 2010  . CESAREAN SECTION N/A 07/20/2015   Procedure: REPEAT CESAREAN SECTION;  Surgeon: Brock Bad, MD;  Location: [facility];  Service: Obstetrics;  Laterality: N/A;  . DILATION AND CURETTAGE OF UTERUS  ~ 2008    OB History    Gravida  5   Para  4   Term  4   Preterm      AB  1     Living  4     SAB  1   TAB      Ectopic      Multiple  0   Live Births  4            Home Medications    Prior to Admission medications   Medication Sig Start Date End Date Taking? Authorizing Provider  acetaminophen (TYLENOL) 325 MG tablet Take 650 mg by mouth every 6 (six) hours as needed for fever or headache.    [provider]  albuterol (VENTOLIN HFA) 108 (90 Base) MCG/ACT inhaler Inhale 1-2 puffs into the lungs every 6 (six) hours as needed for wheezing or shortness of breath. 12/11/19   Lurene Shadow, PA-C  benzonatate (TESSALON) 100 MG capsule Take 1-2 capsules (100-200 mg total) by mouth every 8 (eight) hours. 12/11/19   Lurene Shadow, PA-C  doxycycline (VIBRAMYCIN) 100 MG capsule Take 1 capsule (100 mg total) by mouth 2 (two) times daily. 12/11/19   Lurene Shadow, PA-C  ibuprofen (ADVIL) 200 MG tablet Take 2-3 tablets (400-600 mg total) by mouth every 6 (six) hours as needed for moderate pain. 07/23/19   Lurene Shadow PA-C    Family History Family History  Problem Relation Age of Onset  . Hypertension Mother   . Diabetes Father   . Diabetes Maternal Grandfather     Social History Social History   Tobacco Use  . Smoking status: Never Smoker  . Smokeless tobacco: Never Used  Vaping Use  . Vaping Use: Never used  Substance Use Topics  . Alcohol use: No    Alcohol/week: 0.0 standard drinks  . Drug use: No     Allergies   Penicillins, Tramadol hcl, and Penicillin g   Review of Systems Review of Systems  Constitutional: Positive for fatigue. Negative for chills and fever.  HENT: Positive for congestion, sinus pressure, sinus pain and sore throat. Negative for ear pain, trouble swallowing and voice change.   Respiratory: Positive for cough and shortness of breath.   Cardiovascular: Negative for chest pain and palpitations.  Gastrointestinal: Negative for abdominal pain, diarrhea, nausea and vomiting.  Musculoskeletal: Negative for  arthralgias, back pain and myalgias.  Skin: Negative for rash.  Neurological: Positive for headaches (sinus ). Negative for dizziness and light-headedness.  All other systems reviewed and are negative.    Physical Exam Triage Vital Signs ED Triage Vitals  Enc Vitals Group     BP 12/11/19 1750 (!) 155/96     Pulse Rate 12/11/19 1750 92     Resp 12/11/19 1750 18     Temp 12/11/19 1750 99.7 F (37.6 C)     Temp Source 12/11/19 1750 Oral     SpO2 12/11/19 1750 100 %     Weight 12/11/19 1754 (!) 348 lb (157.9 kg)     Height 12/11/19 1754 5\' 3"  (1.6 m)     Head Circumference --      Peak Flow --      Pain Score 12/11/19 1754 4     Pain Loc --      Pain Edu? --      Excl. in GC? --    No data found.  Updated Vital Signs BP (!) 155/96 (BP Location: Right Arm)   Pulse 92   Temp 99.7 F (37.6 C) (Oral)   Resp 18   Ht 5\' 3"  (1.6 m)   Wt (!) 348 lb (157.9 kg)   LMP 12/06/2019 (Exact Date)   SpO2 100%   Breastfeeding No   BMI 61.65 kg/m   Visual Acuity Right Eye Distance:   Left Eye Distance:   Bilateral Distance:    Right Eye Near:   Left Eye Near:    Bilateral Near:     Physical Exam Vitals and nursing note reviewed.  Constitutional:      General: She is not in acute distress.    Appearance: She is well-developed. She is obese. She is not ill-appearing, toxic-appearing or diaphoretic.  HENT:     Head: Normocephalic and atraumatic.     Right Ear: Tympanic membrane and ear canal normal.     Left Ear: Tympanic membrane and ear canal normal.     Nose:     Right Sinus: Maxillary sinus tenderness and frontal sinus tenderness present.     Left Sinus: Maxillary sinus tenderness and frontal sinus tenderness present.     Mouth/Throat:     Lips: Pink.     Mouth: Mucous membranes are moist.     Pharynx: Oropharynx is clear. Uvula midline.  Cardiovascular:     Rate and Rhythm: Normal rate and regular rhythm.  Pulmonary:     Effort: Pulmonary effort is normal. No  respiratory distress.  Breath sounds: Normal breath sounds. No stridor. No wheezing, rhonchi or rales.  Musculoskeletal:        General: Normal range of motion.     Cervical back: Normal range of motion and neck supple.  Lymphadenopathy:     Cervical: No cervical adenopathy.  Skin:    General: Skin is warm and dry.  Neurological:     Mental Status: She is alert and oriented to person, place, and time.  Psychiatric:        Behavior: Behavior normal.      UC Treatments / Results  Labs (all labs ordered are listed, but only abnormal results are displayed) Labs Reviewed  SARS-COV-2 RNA,(COVID-19) QUALITATIVE NAAT  POC SARS CORONAVIRUS 2 AG -  ED    EKG   Radiology DG Chest 2 View  Result Date: 12/11/2019 CLINICAL DATA:  Cough, shortness of breath. Additional history provided: Fatigue, shortness of breath, sore throat for 3 days, history of COVID in July 2020. EXAM: CHEST - 2 VIEW COMPARISON:  Chest radiograph 10/24/2018 and earlier. FINDINGS: Heart size within normal limits. A faint pulmonary opacity is questioned within the left upper to mid lung field. The right lung is clear. No evidence of pleural effusion or pneumothorax. No acute bony abnormality identified. IMPRESSION: A faint pulmonary opacity is questioned within the left upper to mid lung field. This may reflect scarring from prior COVID pneumonia. Active atypical/viral pneumonia cannot be excluded and clinical correlation is recommended. Additionally, radiographic follow-up is recommended. Electronically Signed   By: Jackey Loge DO   On: 12/11/2019 19:01    Procedures Procedures (including critical care time)  Medications Ordered in UC Medications  methylPREDNISolone sodium succinate (SOLU-MEDROL) 40 mg/mL injection 80 mg (80 mg Intramuscular Given 12/11/19 1914)    Initial Impression / Assessment and Plan / UC Course  I have reviewed the triage vital signs and the nursing notes.  Pertinent labs & imaging  results that were available during my care of the patient were reviewed by me and considered in my medical decision making (see chart for details).    Discussed imaging with pt COVID PCR test sent to lab Will cover for secondary bacterial infection given worsening symptoms and prior hospitalization with COVID pneumonia last year Encouraged close f/u with PCP Encouraged to f/u with [facility], especially if current COVID test is negative O2 Sat 100% on RA in UC, HR 92bpm Doubt PE Discussed symptoms that warrant emergent care in the ED. AVS given  Final Clinical Impressions(s) / UC Diagnoses   Final diagnoses:  Acute upper respiratory infection  Shortness of breath  Frontal headache     Discharge Instructions     Your symptoms could be a new viral illness causing your lungs to react from recent COVID illness. To cover for potential bacterial infection, you are being started on doxycycline, however, it is strongly encouraged to follow up with primary care and call to schedule an appointment with the Post[facility]COVID Care [facility] because some patients have prolonged symptoms from COVID known as "long haulers"     Call 911 or have someone drive you to the hospital if symptoms significantly worsening.     ED Prescriptions    Medication Sig Dispense Auth. Provider   benzonatate (TESSALON) 100 MG capsule Take 1-2 capsules (100-200 mg total) by mouth every 8 (eight) hours. 21 capsule Waylan Rocher O, PA-C   doxycycline (VIBRAMYCIN) 100 MG capsule Take 1 capsule (100 mg total) by mouth 2 (two) times daily. 20  capsule Waylan Rocher O, PA-C   albuterol (VENTOLIN HFA) 108 (90 Base) MCG/ACT inhaler Inhale 1-2 puffs into the lungs every 6 (six) hours as needed for wheezing or shortness of breath. 18 g Lurene Shadow, PA-C     PDMP not reviewed this encounter.   Lurene Shadow, [place] 12/11/19 1952

## 2019-12-11 NOTE — ED Triage Notes (Signed)
Pt has had fatigue, SOB,  sore throat since Tuesday after returning from [place] w/ her husband after his surgery Pt had COVID July 2020 No Covid vaccine - has an appoint for next week No OTC meds

## 2019-12-11 NOTE — Discharge Instructions (Signed)
Your symptoms could be a new viral illness causing your lungs to react from recent COVID illness. To cover for potential bacterial infection, you are being started on doxycycline, however, it is strongly encouraged to follow up with primary care and call to schedule an appointment with the Post-COVID Care Clinic because some patients have prolonged symptoms from COVID known as "long haulers"     Call 911 or have someone drive you to the hospital if symptoms significantly worsening.

## 2019-12-12 LAB — SARS-COV-2 RNA,(COVID-19) QUALITATIVE NAAT: SARS CoV2 RNA: NOT DETECTED

## 2019-12-16 ENCOUNTER — Encounter: Payer: Self-pay | Admitting: *Deleted

## 2019-12-16 ENCOUNTER — Other Ambulatory Visit: Payer: Self-pay

## 2019-12-16 ENCOUNTER — Emergency Department
Admission: EM | Admit: 2019-12-16 | Discharge: 2019-12-16 | Disposition: A | Discharge status: Home / Self Care | Payer: Medicaid Other | Source: Home / Self Care

## 2019-12-16 DIAGNOSIS — J189 Pneumonia, unspecified organism: Secondary | ICD-10-CM

## 2019-12-16 MED ORDER — PROMETHAZINE-CODEINE 6.25-10 MG/5ML PO SYRP: 5.0000 mL | ORAL_SOLUTION | Freq: Every evening | ORAL | 0 refills | Status: DC | PRN

## 2019-12-16 MED ORDER — DEXAMETHASONE SODIUM PHOSPHATE 10 MG/ML IJ SOLN
10.0000 mg | Freq: Once | INTRAMUSCULAR | Status: AC
  Administered 2019-12-16: 10 mg via INTRAMUSCULAR

## 2019-12-16 MED ORDER — PREDNISONE 20 MG PO TABS: 40.0000 mg | ORAL_TABLET | Freq: Every day | ORAL | 0 refills | Status: AC

## 2019-12-16 NOTE — Discharge Instructions (Addendum)
In five days if your symptoms, have not markedly improved, please return an I recommend repeat chest x-ray.  Start prednisone tomorrow and take as directed. Take promethazine with codeine at bedtime for management of cough

## 2019-12-16 NOTE — ED Provider Notes (Signed)
[name]    CSN: [id number] Arrival date & time: 12/16/19  1050      History   Chief Complaint Chief Complaint  Patient presents with   Cough   Chest Pain    with cough    HPI [name] is a 39 y.o. female.   HPI  Patient seen in clinic on 12/11/19 with low-grade fever, shortness of breath, cough and fatigue and headache.  Patient was tested for COVID-19 which resulted negative.  Patient had a previous COVID-19 virus back in August 2020.  No recent exposure.  Chest x-ray performed during her visit on 12/11/19 was significant for a faint opacity in the left upper to mid lung field which was consistent for a probable pneumonia.  Patient was covered with antibiotics and represents today as she is continuing to have worsening cough, wheezing, and shortness of breath.  She did receive a steroid injection during her visit however was not discharged with oral prednisone.  Reports she is unable to sleep due to cough.  She is having some chest tightness although not experiencing any chest pain.  She also continues to have a low-grade fever. Past Medical History:  Diagnosis Date   Arthritis    "back; right foot; left ankle" (07/22/2014)   Cellulitis of leg, right 07/2014   Chronic lower back pain    Morbid obesity with BMI of 50.0-59.9, adult [facility])    Vaginal delivery 1998    Patient Active Problem List   Diagnosis Date Noted   S/P cesarean section 07/20/2015   Gastroenteritis 06/16/2015   Advanced maternal age in multigravida 01/17/2015   [redacted] weeks gestation of pregnancy    Anemia 07/24/2014   Morbid obesity ([facility]) 07/22/2014   Cellulitis of right lower extremity 07/22/2014   Prediabetes 07/22/2014   Fissure in skin of foot 07/22/2014   GERD without esophagitis 08/28/2012    Past Surgical History:  Procedure Laterality Date   CESAREAN SECTION  1994; 2010   CESAREAN SECTION N/A 07/20/2015   Procedure: REPEAT CESAREAN SECTION;  Surgeon: Brock Bad, MD;  Location: [facility];  Service: Obstetrics;  Laterality: N/A;   DILATION AND CURETTAGE OF UTERUS  ~ 2008    OB History    Gravida  5   Para  4   Term  4   Preterm      AB  1   Living  4     SAB  1   TAB      Ectopic      Multiple  0   Live Births  4            Home Medications    Prior to Admission medications   Medication Sig Start Date End Date Taking? Authorizing Provider  acetaminophen (TYLENOL) 325 MG tablet Take 650 mg by mouth every 6 (six) hours as needed for fever or headache.    [provider]  albuterol (VENTOLIN HFA) 108 (90 Base) MCG/ACT inhaler Inhale 1-2 puffs into the lungs every 6 (six) hours as needed for wheezing or shortness of breath. 12/11/19   Lurene Shadow, PA-C  benzonatate (TESSALON) 100 MG capsule Take 1-2 capsules (100-200 mg total) by mouth every 8 (eight) hours. 12/11/19   Lurene Shadow, PA-C  doxycycline (VIBRAMYCIN) 100 MG capsule Take 1 capsule (100 mg total) by mouth 2 (two) times daily. 12/11/19   Lurene Shadow, PA-C  ibuprofen (ADVIL) 200 MG tablet Take 2-3 tablets (400-600 mg total)  by mouth every 6 (six) hours as needed for moderate pain. 07/23/19   Lurene Shadow, PA-C    Family History Family History  Problem Relation Age of Onset   Hypertension Mother    Diabetes Father    Diabetes Maternal Grandfather     Social History Social History   Tobacco Use   Smoking status: Never Smoker   Smokeless tobacco: Never Used  Building services engineer Use: Never used  Substance Use Topics   Alcohol use: No    Alcohol/week: 0.0 standard drinks   Drug use: No     Allergies   Penicillins, Tramadol hcl, and Penicillin g   Review of Systems Review of Systems  Pertinent negatives listed in HPI   Physical Exam Triage Vital Signs ED Triage Vitals [12/16/19 1323]  Enc Vitals Group     BP (!) 141/85     Pulse Rate 94     Resp (!) 22     Temp 99.3 F (37.4 C)     Temp Source Oral     SpO2 100  %     Weight      Height      Head Circumference      Peak Flow      Pain Score 0     Pain Loc      Pain Edu?      Excl. in GC?    No data found.  Updated Vital Signs BP (!) 141/85 (BP Location: Right Arm)    Pulse 94    Temp 99.3 F (37.4 C) (Oral)    Resp (!) 22    LMP 12/06/2019 (Exact Date)    SpO2 100%   Visual Acuity Right Eye Distance:   Left Eye Distance:   Bilateral Distance:    Right Eye Near:   Left Eye Near:    Bilateral Near:     Physical Exam Constitutional:      Appearance: She is obese. She is ill-appearing. She is not toxic-appearing.  Pulmonary:     Effort: Prolonged expiration present.     Breath sounds: Examination of the right-upper field reveals wheezing. Examination of the left-upper field reveals wheezing and rales. Examination of the right-middle field reveals wheezing. Examination of the left-middle field reveals wheezing and rales. Wheezing and rales present.  Skin:    General: Skin is warm.  Neurological:     Mental Status: She is alert and oriented to person, place, and time.  Psychiatric:        Attention and Perception: Attention and perception normal.        Thought Content: Thought content normal.      UC Treatments / Results  Labs (all labs ordered are listed, but only abnormal results are displayed) Labs Reviewed - No data to display  EKG   Radiology No results found.  Procedures Procedures (including critical care time)  Medications Ordered in UC Medications  dexamethasone (DECADRON) injection 10 mg (10 mg Intramuscular Given 12/16/19 1444)  Patient given dementia  Initial Impression  Assessment and Plan / UC Course  I have reviewed the triage vital signs and the nursing notes.  Pertinent labs & imaging results that were available during my care of the patient were reviewed by me and considered in my medical decision making (see chart for details).    Continue antibiotic therapy.  Will add prednisone 40 mg once  daily x5 days.  Patient given oral Decadron injection here in clinic today to help  with wheezing and improve work of breathing.  Advised to continue inhaler as prescribed.  Follow-up promethazine with codeine for cough.  Provided with work note patient needs to rest and continue to recover from.  Strict ER precautions if breathing worsens or if any severe chest pressure or pain develops.  Patient verbalized understanding and agreement with plan. Final Clinical Impressions(s) / UC Diagnoses   Final diagnoses:  Pneumonia due to infectious organism, unspecified laterality, unspecified part of lung     Discharge Instructions     In five days if your symptoms, have not markedly improved, please return an I recommend repeat chest x-ray.  Start prednisone tomorrow and take as directed. Take promethazine with codeine at bedtime for management of cough    ED Prescriptions    Medication Sig Dispense Auth. Provider   promethazine-codeine (PHENERGAN WITH CODEINE) 6.25-10 MG/5ML syrup Take 5-10 mLs by mouth at bedtime as needed for cough (at bedtime only). 120 mL Bing Neighbors, FNP   predniSONE (DELTASONE) 20 MG tablet Take 2 tablets (40 mg total) by mouth daily with breakfast for 5 days. 10 tablet Bing Neighbors, FNP     PDMP not reviewed this encounter.   Bing Neighbors, FNP 12/22/19 2242

## 2019-12-16 NOTE — ED Triage Notes (Signed)
Patient was seen 12/11/19. Taking antibiotics as prescribed. Not improving. Still c/o cough and CP with cough and SOB with activity.

## 2020-02-03 ENCOUNTER — Other Ambulatory Visit: Payer: Self-pay | Admitting: Obstetrics

## 2020-02-03 ENCOUNTER — Other Ambulatory Visit: Payer: Self-pay | Admitting: *Deleted

## 2020-02-03 DIAGNOSIS — N6320 Unspecified lump in the left breast, unspecified quadrant: Secondary | ICD-10-CM

## 2020-03-19 ENCOUNTER — Encounter (HOSPITAL_COMMUNITY): Payer: Self-pay

## 2020-03-19 ENCOUNTER — Ambulatory Visit (HOSPITAL_COMMUNITY)
Admission: EM | Admit: 2020-03-19 | Discharge: 2020-03-19 | Disposition: A | Discharge status: Home / Self Care | Payer: Medicaid Other | Attending: Family Medicine | Admitting: Family Medicine

## 2020-03-19 ENCOUNTER — Other Ambulatory Visit: Payer: Self-pay

## 2020-03-19 DIAGNOSIS — S39012A Strain of muscle, fascia and tendon of lower back, initial encounter: Secondary | ICD-10-CM | POA: Insufficient documentation

## 2020-03-19 DIAGNOSIS — N6320 Unspecified lump in the left breast, unspecified quadrant: Secondary | ICD-10-CM | POA: Insufficient documentation

## 2020-03-19 DIAGNOSIS — R1030 Lower abdominal pain, unspecified: Secondary | ICD-10-CM | POA: Insufficient documentation

## 2020-03-19 LAB — POCT URINALYSIS DIPSTICK, ED / UC
Bilirubin Urine: NEGATIVE
Glucose, UA: NEGATIVE mg/dL
Hgb urine dipstick: NEGATIVE
Ketones, ur: NEGATIVE mg/dL
Leukocytes,Ua: NEGATIVE
Nitrite: NEGATIVE
Protein, ur: NEGATIVE mg/dL
Specific Gravity, Urine: 1.025 (ref 1.005–1.030)
Urobilinogen, UA: 0.2 mg/dL (ref 0.0–1.0)
pH: 6 (ref 5.0–8.0)

## 2020-03-19 MED ORDER — CYCLOBENZAPRINE HCL 10 MG PO TABS: 10.0000 mg | ORAL_TABLET | Freq: Three times a day (TID) | ORAL | 0 refills | Status: DC | PRN

## 2020-03-19 NOTE — ED Triage Notes (Addendum)
Pt presents with increased urinary frequency and lower back pain x 1 week. States having pressure in the lower abdomen. Denies fever,  nausea.     Pt reports having a lump in the left breast x 5-6 months. [facility] and was told she needs to be seeing at the UC first.

## 2020-03-20 NOTE — ED Provider Notes (Signed)
[facility]    CSN: [id number] Arrival date & time: 03/19/20  1602      History   Chief Complaint Chief Complaint  Patient presents with  . Urinary Frequency  . Breast Problem    HPI [name] is a 39 y.o. female.   Presenting today with 1 week of b/l low back and hip soreness and now also pressure in lower abdomen, urinary frequency. Denies fever, chills, N/V, concern for pregnancy or STI, vaginal discharge or itching. Not trying anything OTC for sxs. No known injury to explain her back pain.   She also notes a breast mass left side above areola that has been larger in size the past 6 months. Known hx of breast cysts that were previously being monitored every 6 months until she lost insurance a few years ago. Denies nipple discharge, skin changes, underarm changes.      Past Medical History:  Diagnosis Date  . Arthritis    "back; right foot; left ankle" (07/22/2014)  . Cellulitis of leg, right 07/2014  . Chronic lower back pain   . Morbid obesity with BMI of 50.0-59.9, adult (HCC)   . Vaginal delivery 1998    Patient Active Problem List   Diagnosis Date Noted  . S/P cesarean section 07/20/2015  . Gastroenteritis 06/16/2015  . Advanced maternal age in multigravida 01/17/2015  . [redacted] weeks gestation of pregnancy   . Anemia 07/24/2014  . Morbid obesity (HCC) 07/22/2014  . Cellulitis of right lower extremity 07/22/2014  . Prediabetes 07/22/2014  . Fissure in skin of foot 07/22/2014  . GERD without esophagitis 08/28/2012    Past Surgical History:  Procedure Laterality Date  . CESAREAN SECTION  1994; 2010  . CESAREAN SECTION N/A 07/20/2015   Procedure: REPEAT CESAREAN SECTION;  Surgeon: Brock Bad, MD;  Location: [facility];  Service: Obstetrics;  Laterality: N/A;  . DILATION AND CURETTAGE OF UTERUS  ~ 2008    OB History    Gravida  5   Para  4   Term  4   Preterm      AB  1   Living  4     SAB  1   IAB      Ectopic      Multiple   0   Live Births  4            Home Medications    Prior to Admission medications   Medication Sig Start Date End Date Taking? Authorizing Provider  acetaminophen (TYLENOL) 325 MG tablet Take 650 mg by mouth every 6 (six) hours as needed for fever or headache.    [provider]  albuterol (VENTOLIN HFA) 108 (90 Base) MCG/ACT inhaler Inhale 1-2 puffs into the lungs every 6 (six) hours as needed for wheezing or shortness of breath. 12/11/19   Lurene Shadow, PA-C  benzonatate (TESSALON) 100 MG capsule Take 1-2 capsules (100-200 mg total) by mouth every 8 (eight) hours. 12/11/19   Lurene Shadow, PA-C  cyclobenzaprine (FLEXERIL) 10 MG tablet Take 1 tablet (10 mg total) by mouth 3 (three) times daily as needed for muscle spasms. DO NOT DRINK ALCOHOL OR DRIVE WHILE TAKING THIS MEDICATION 03/19/20   Particia Nearing, PA-C  doxycycline (VIBRAMYCIN) 100 MG capsule Take 1 capsule (100 mg total) by mouth 2 (two) times daily. 12/11/19   Lurene Shadow, PA-C  ibuprofen (ADVIL) 200 MG tablet Take 2-3 tablets (400-600 mg total) by mouth every 6 (six)  hours as needed for moderate pain. 07/23/19   Lurene Shadow, PA-C  promethazine-codeine (PHENERGAN WITH CODEINE) 6.25-10 MG/5ML syrup Take 5-10 mLs by mouth at bedtime as needed for cough (at bedtime only). 12/16/19   Bing Neighbors, FNP    Family History Family History  Problem Relation Age of Onset  . Hypertension Mother   . Diabetes Father   . Diabetes Maternal Grandfather     Social History Social History   Tobacco Use  . Smoking status: Never Smoker  . Smokeless tobacco: Never Used  Vaping Use  . Vaping Use: Never used  Substance Use Topics  . Alcohol use: No    Alcohol/week: 0.0 standard drinks  . Drug use: No     Allergies   Penicillins, Tramadol hcl, and Penicillin g   Review of Systems Review of Systems PER HPI    Physical Exam Triage Vital Signs ED Triage Vitals  Enc Vitals Group     BP 03/19/20  1647 (!) 160/82     Pulse Rate 03/19/20 1647 65     Resp 03/19/20 1647 20     Temp 03/19/20 1647 98.6 F (37 C)     Temp Source 03/19/20 1647 Oral     SpO2 03/19/20 1647 98 %     Weight --      Height --      Head Circumference --      Peak Flow --      Pain Score 03/19/20 1646 0     Pain Loc --      Pain Edu? --      Excl. in GC? --    No data found.  Updated Vital Signs BP (!) 160/82 (BP Location: Left Arm)   Pulse 65   Temp 98.6 F (37 C) (Oral)   Resp 20   LMP 02/28/2020 (Exact Date)   SpO2 98%   Visual Acuity Right Eye Distance:   Left Eye Distance:   Bilateral Distance:    Right Eye Near:   Left Eye Near:    Bilateral Near:     Physical Exam Vitals and nursing note reviewed.  Constitutional:      Appearance: Normal appearance. She is not ill-appearing.  HENT:     Head: Atraumatic.  Eyes:     Extraocular Movements: Extraocular movements intact.     Conjunctiva/sclera: Conjunctivae normal.  Cardiovascular:     Rate and Rhythm: Normal rate and regular rhythm.     Heart sounds: Normal heart sounds.  Pulmonary:     Effort: Pulmonary effort is normal.     Breath sounds: Normal breath sounds.  Chest:    Abdominal:     General: Bowel sounds are normal. There is no distension.     Palpations: Abdomen is soft.     Tenderness: There is abdominal tenderness (suprapubic ttp). There is no right CVA tenderness, left CVA tenderness or guarding.  Musculoskeletal:        General: Tenderness (b/l lumbar musculature ttp extending down lateral hips b/l) present. Normal range of motion.     Cervical back: Normal range of motion and neck supple.  Skin:    General: Skin is warm and dry.  Neurological:     Mental Status: She is alert and oriented to person, place, and time.  Psychiatric:        Mood and Affect: Mood normal.        Thought Content: Thought content normal.        Judgment:  Judgment normal.      UC Treatments / Results  Labs (all labs ordered are  listed, but only abnormal results are displayed) Labs Reviewed  POCT URINALYSIS DIPSTICK, ED / UC  CERVICOVAGINAL ANCILLARY ONLY    EKG   Radiology No results found.  Procedures Procedures (including critical care time)  Medications Ordered in UC Medications - No data to display  Initial Impression / Assessment and Plan / UC Course  I have reviewed the triage vital signs and the nursing notes.  Pertinent labs & imaging results that were available during my care of the patient were reviewed by me and considered in my medical decision making (see chart for details).     U/A negative, aptima swab pending. Suspect her back pain is more related to muscular strain so will treat with NSAIDs, flexeril, heat, stretches.   Will fax imaging order for diagnostic mammogram with axillary ultrasound to [facility], discussed with patient to call and schedule appt. PCP information given, strongly recommended she establish care for ongoing monitoring/mgmt.   Final Clinical Impressions(s) / UC Diagnoses   Final diagnoses:  Lower abdominal pain  Strain of lumbar region, initial encounter  Left breast mass   Discharge Instructions   None    ED Prescriptions    Medication Sig Dispense Auth. Provider   cyclobenzaprine (FLEXERIL) 10 MG tablet Take 1 tablet (10 mg total) by mouth 3 (three) times daily as needed for muscle spasms. DO NOT DRINK ALCOHOL OR DRIVE WHILE TAKING THIS MEDICATION 15 tablet Particia Nearing, [place]     PDMP not reviewed this encounter.   Roosvelt Maser [place], [place] 03/20/20 [phone]

## 2020-03-21 LAB — CERVICOVAGINAL ANCILLARY ONLY
Bacterial Vaginitis (gardnerella): POSITIVE — AB
Candida Glabrata: NEGATIVE
Candida Vaginitis: NEGATIVE
Comment: NEGATIVE
Comment: NEGATIVE
Comment: NEGATIVE

## 2020-03-22 ENCOUNTER — Telehealth (HOSPITAL_COMMUNITY): Payer: Self-pay | Admitting: Emergency Medicine

## 2020-03-22 MED ORDER — METRONIDAZOLE 500 MG PO TABS: 500.0000 mg | ORAL_TABLET | Freq: Two times a day (BID) | ORAL | 0 refills | Status: DC

## 2020-03-29 ENCOUNTER — Emergency Department
Admission: EM | Admit: 2020-03-29 | Discharge: 2020-03-29 | Disposition: A | Discharge status: Home / Self Care | Payer: Medicaid Other | Source: Home / Self Care

## 2020-03-29 ENCOUNTER — Emergency Department (INDEPENDENT_AMBULATORY_CARE_PROVIDER_SITE_OTHER): Payer: Medicaid Other

## 2020-03-29 ENCOUNTER — Other Ambulatory Visit: Payer: Self-pay

## 2020-03-29 ENCOUNTER — Encounter: Payer: Self-pay | Admitting: Family Medicine

## 2020-03-29 DIAGNOSIS — R5383 Other fatigue: Secondary | ICD-10-CM

## 2020-03-29 DIAGNOSIS — R059 Cough, unspecified: Secondary | ICD-10-CM | POA: Diagnosis not present

## 2020-03-29 DIAGNOSIS — J069 Acute upper respiratory infection, unspecified: Secondary | ICD-10-CM

## 2020-03-29 DIAGNOSIS — J189 Pneumonia, unspecified organism: Secondary | ICD-10-CM

## 2020-03-29 MED ORDER — PROMETHAZINE-CODEINE 6.25-10 MG/5ML PO SYRP: 5.0000 mL | ORAL_SOLUTION | Freq: Every evening | ORAL | 0 refills | Status: DC | PRN

## 2020-03-29 NOTE — Discharge Instructions (Addendum)
Your x-ray does not show pneumonia.  You have a viral infection and we will check you for Covid, flu, and RSV  Stay out of work until test results are back.

## 2020-03-29 NOTE — ED Provider Notes (Signed)
[name]    CSN: [id number] Arrival date & time: 03/29/20  1829      History   Chief Complaint Chief Complaint  Patient presents with  . Dizziness  . Headache  . Back Pain  . Chest Pain    Due to cough    HPI [name] is a 39 y.o. female.   [name] urgent care patient  Pt c/o cough, headache and dizziness since yesterday. RT ear pain starting today. Also c/o bodyaches, in particular lower back and chest. Ibuprofen prn. No known covid exposure. Has had both covid vaccines.     Past Medical History:  Diagnosis Date  . Arthritis    "back; right foot; left ankle" (07/22/2014)  . Cellulitis of leg, right 07/2014  . Chronic lower back pain   . Morbid obesity with BMI of 50.0-59.9, adult (HCC)   . Vaginal delivery 1998    Patient Active Problem List   Diagnosis Date Noted  . S/P cesarean section 07/20/2015  . Gastroenteritis 06/16/2015  . Advanced maternal age in multigravida 01/17/2015  . [redacted] weeks gestation of pregnancy   . Anemia 07/24/2014  . Morbid obesity (HCC) 07/22/2014  . Cellulitis of right lower extremity 07/22/2014  . Prediabetes 07/22/2014  . Fissure in skin of foot 07/22/2014  . GERD without esophagitis 08/28/2012    Past Surgical History:  Procedure Laterality Date  . CESAREAN SECTION  1994; 2010  . CESAREAN SECTION N/A 07/20/2015   Procedure: REPEAT CESAREAN SECTION;  Surgeon: Brock Bad, MD;  Location: [facility];  Service: Obstetrics;  Laterality: N/A;  . DILATION AND CURETTAGE OF UTERUS  ~ 2008    OB History    Gravida  5   Para  4   Term  4   Preterm      AB  1   Living  4     SAB  1   IAB      Ectopic      Multiple  0   Live Births  4            Home Medications    Prior to Admission medications   Medication Sig Start Date End Date Taking? Authorizing Provider  acetaminophen (TYLENOL) 325 MG tablet Take 650 mg by mouth every 6 (six) hours as needed for fever or headache.     [provider]  albuterol (VENTOLIN HFA) 108 (90 Base) MCG/ACT inhaler Inhale 1-2 puffs into the lungs every 6 (six) hours as needed for wheezing or shortness of breath. 12/11/19   Lurene Shadow, PA-C  cyclobenzaprine (FLEXERIL) 10 MG tablet Take 1 tablet (10 mg total) by mouth 3 (three) times daily as needed for muscle spasms. DO NOT DRINK ALCOHOL OR DRIVE WHILE TAKING THIS MEDICATION 03/19/20   Particia Nearing, PA-C  ibuprofen (ADVIL) 200 MG tablet Take 2-3 tablets (400-600 mg total) by mouth every 6 (six) hours as needed for moderate pain. 07/23/19   Lurene Shadow, PA-C  promethazine-codeine (PHENERGAN WITH CODEINE) 6.25-10 MG/5ML syrup Take 5-10 mLs by mouth at bedtime as needed for cough (at bedtime only). 03/29/20   Elvina Sidle, MD    Family History Family History  Problem Relation Age of Onset  . Hypertension Mother   . Diabetes Father   . Diabetes Maternal Grandfather     Social History Social History   Tobacco Use  . Smoking status: Never Smoker  . Smokeless tobacco: Never Used  Vaping Use  .  Vaping Use: Never used  Substance Use Topics  . Alcohol use: No    Alcohol/week: 0.0 standard drinks  . Drug use: No     Allergies   Penicillins, Tramadol hcl, and Penicillin g   Review of Systems Review of Systems  Constitutional: Positive for fatigue. Negative for fever.  HENT: Positive for ear pain and sore throat.   Respiratory: Positive for cough.   Gastrointestinal: Negative.   Musculoskeletal: Positive for back pain and myalgias.  Neurological: Positive for headaches.     Physical Exam Triage Vital Signs ED Triage Vitals  Enc Vitals Group     BP 03/29/20 1843 (!) 144/86     Pulse Rate 03/29/20 1843 (!) 106     Resp 03/29/20 1843 19     Temp 03/29/20 1843 98.6 F (37 C)     Temp Source 03/29/20 1843 Oral     SpO2 03/29/20 1843 100 %     Weight --      Height --      Head Circumference --      Peak Flow --      Pain Score 03/29/20  1844 8     Pain Loc --      Pain Edu? --      Excl. in GC? --    No data found.  Updated Vital Signs BP (!) 144/86 (BP Location: Left Wrist)   Pulse (!) 106   Temp 98.6 F (37 C) (Oral)   Resp 19   LMP 03/26/2020   SpO2 100%    Physical Exam Vitals and nursing note reviewed.  Constitutional:      General: She is not in acute distress.    Appearance: She is well-developed. She is obese. She is ill-appearing.  HENT:     Head: Normocephalic.     Comments: Normal TM's    Mouth/Throat:     Mouth: Mucous membranes are moist.     Pharynx: Oropharynx is clear.  Eyes:     Extraocular Movements: Extraocular movements intact.  Cardiovascular:     Rate and Rhythm: Tachycardia present.  Pulmonary:     Effort: Pulmonary effort is normal.     Breath sounds: Rhonchi present.  Musculoskeletal:        General: Normal range of motion.     Cervical back: Normal range of motion and neck supple.  Skin:    General: Skin is warm and dry.  Neurological:     Mental Status: She is alert.     Cranial Nerves: No cranial nerve deficit.  Psychiatric:        Mood and Affect: Mood is depressed.        Behavior: Behavior normal.      UC Treatments / Results  Labs (all labs ordered are listed, but only abnormal results are displayed) Labs Reviewed - No data to display  EKG   Radiology No results found.  Procedures Procedures (including critical care time)  Medications Ordered in UC Medications - No data to display  Initial Impression / Assessment and Plan / UC Course  I have reviewed the triage vital signs and the nursing notes.  Pertinent labs & imaging results that were available during my care of the patient were reviewed by me and considered in my medical decision making (see chart for details).     Final Clinical Impressions(s) / UC Diagnoses   Final diagnoses:  Viral URI with cough     Discharge Instructions     Your  x-ray does not show pneumonia.  You have a  viral infection and we will check you for Covid, flu, and RSV  Stay out of work until test results are back.    ED Prescriptions    Medication Sig Dispense Auth. Provider   promethazine-codeine (PHENERGAN WITH CODEINE) 6.25-10 MG/5ML syrup Take 5-10 mLs by mouth at bedtime as needed for cough (at bedtime only). 120 mL Elvina Sidle, MD     I have reviewed the PDMP during this encounter.   Elvina Sidle, MD 03/29/20 1940

## 2020-03-29 NOTE — ED Triage Notes (Signed)
Pt c/o cough, headache and dizziness since yesterday. RT ear pain starting today. Also c/o bodyaches, in particular lower back and chest. Ibuprofen prn. No known covid exposure. Has had both covid vaccines.

## 2020-03-31 LAB — COVID-19, FLU A+B AND RSV
Influenza A, NAA: NOT DETECTED
Influenza B, NAA: NOT DETECTED
RSV, NAA: NOT DETECTED
SARS-CoV-2, NAA: DETECTED — AB

## 2020-05-09 ENCOUNTER — Telehealth (HOSPITAL_COMMUNITY): Payer: Self-pay | Admitting: Family Medicine

## 2020-05-09 NOTE — Telephone Encounter (Signed)
Breast imaging orders sent to GI

## 2020-05-10 ENCOUNTER — Other Ambulatory Visit (HOSPITAL_COMMUNITY): Payer: Self-pay | Admitting: Family Medicine

## 2020-05-10 ENCOUNTER — Telehealth (HOSPITAL_COMMUNITY): Payer: Self-pay | Admitting: Emergency Medicine

## 2020-05-10 ENCOUNTER — Ambulatory Visit
Admission: RE | Admit: 2020-05-10 | Discharge: 2020-05-10 | Disposition: A | Discharge status: Home / Self Care | Payer: Medicaid Other | Source: Ambulatory Visit | Attending: Family Medicine | Admitting: Family Medicine

## 2020-05-10 ENCOUNTER — Other Ambulatory Visit: Payer: Self-pay

## 2020-05-10 DIAGNOSIS — N6489 Other specified disorders of breast: Secondary | ICD-10-CM | POA: Diagnosis not present

## 2020-05-10 DIAGNOSIS — R928 Other abnormal and inconclusive findings on diagnostic imaging of breast: Secondary | ICD-10-CM | POA: Diagnosis not present

## 2020-05-10 DIAGNOSIS — N6320 Unspecified lump in the left breast, unspecified quadrant: Secondary | ICD-10-CM

## 2020-05-10 NOTE — Telephone Encounter (Signed)
Received call from [facility] needing clarification on dx UC order. Per Roosvelt Maser, PA okay to only perform Left [place] due to mass. NO dx for right breast at this time.

## 2020-06-21 ENCOUNTER — Other Ambulatory Visit: Payer: Self-pay

## 2020-06-21 ENCOUNTER — Ambulatory Visit: Payer: Medicaid Other | Admitting: Medical-Surgical

## 2020-06-21 ENCOUNTER — Ambulatory Visit (INDEPENDENT_AMBULATORY_CARE_PROVIDER_SITE_OTHER): Payer: Medicaid Other

## 2020-06-21 ENCOUNTER — Other Ambulatory Visit: Payer: Self-pay | Admitting: Medical-Surgical

## 2020-06-21 ENCOUNTER — Encounter: Payer: Self-pay | Admitting: Medical-Surgical

## 2020-06-21 VITALS — BP 115/74 | HR 67 | Temp 98.7°F | Ht 63.5 in | Wt 370.5 lb

## 2020-06-21 DIAGNOSIS — M5441 Lumbago with sciatica, right side: Secondary | ICD-10-CM | POA: Diagnosis not present

## 2020-06-21 DIAGNOSIS — Z6841 Body Mass Index (BMI) 40.0 and over, adult: Secondary | ICD-10-CM | POA: Diagnosis not present

## 2020-06-21 DIAGNOSIS — Z1159 Encounter for screening for other viral diseases: Secondary | ICD-10-CM

## 2020-06-21 DIAGNOSIS — M545 Low back pain, unspecified: Secondary | ICD-10-CM | POA: Diagnosis not present

## 2020-06-21 DIAGNOSIS — G8929 Other chronic pain: Secondary | ICD-10-CM | POA: Diagnosis not present

## 2020-06-21 DIAGNOSIS — Z8616 Personal history of COVID-19: Secondary | ICD-10-CM | POA: Insufficient documentation

## 2020-06-21 DIAGNOSIS — Z7689 Persons encountering health services in other specified circumstances: Secondary | ICD-10-CM | POA: Diagnosis not present

## 2020-06-21 DIAGNOSIS — Z0000 Encounter for general adult medical examination without abnormal findings: Secondary | ICD-10-CM

## 2020-06-21 HISTORY — DX: Personal history of COVID-19: Z86.16

## 2020-06-21 MED ORDER — PREDNISONE 50 MG PO TABS: 50.0000 mg | ORAL_TABLET | Freq: Every day | ORAL | 0 refills | Status: DC

## 2020-06-21 MED ORDER — CELECOXIB 200 MG PO CAPS: 200.0000 mg | ORAL_CAPSULE | Freq: Two times a day (BID) | ORAL | 2 refills | Status: DC | PRN

## 2020-06-21 NOTE — Progress Notes (Signed)
New Patient Office Visit  Subjective:  Patient ID: [name], female    DOB: [birth year]  Age: 40 y.o. MRN: [id number]  CC:  Chief Complaint  Patient presents with  . Establish Care    HPI [name] presents to establish care.  Low back pain-she does have a history of chronic low back pain but notes this has worsened recently and has become intolerable.  Had an MRI in 2013 which showed mild bulging of the disks in her lumbar spine.  No further work-up or intervention since then.  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  Currently reports she has bilateral lower back pain with sciatica extending into the right buttock.  She does have intermittent numbness and tingling of the right leg and notes that she has numbness in her lower buttocks at times.  Weight concerns-she is very concerned about her weight and wants to make a concerted effort at weight loss.  She did take phentermine in the past and tolerated it well.  Is interested in what options are available at this point.  Notes that she does have poor eating habits.  At times, only eats 1 meal per day due to work and caring for her children.  Exercise is limited at this point due to her back pain.  Past Medical History:  Diagnosis Date  . [redacted] weeks gestation of pregnancy   . Advanced maternal age in multigravida 01/17/2015  . Arthritis    "back; right foot; left ankle" (07/22/2014)  . Cellulitis of leg, right 07/2014  . Chronic lower back pain   . History of COVID-19 06/21/2020  . Morbid obesity with BMI of 50.0-59.9, adult (HCC)   . Prediabetes   . S/P cesarean section 07/20/2015  . Vaginal delivery 1998   Past Surgical History:  Procedure Laterality Date  . CESAREAN SECTION  1994; 2010  . CESAREAN SECTION N/A 07/20/2015   Procedure: REPEAT CESAREAN SECTION;  Surgeon: Brock Bad, MD;  Location: [facility];  Service: Obstetrics;  Laterality: N/A;  . DILATION  AND CURETTAGE OF UTERUS  ~ 2008  . TUBAL LIGATION      Family History  Problem Relation Age of Onset  . Hypertension Mother   . Diabetes Father   . Prostate cancer Father   . Diabetes Maternal Grandfather   . Heart attack Other     Social History   Socioeconomic History  . Marital status: Significant Other    Spouse name: Not on file  . Number of children: Not on file  . Years of education: Not on file  . Highest education level: Not on file  Occupational History  . Not on file  Tobacco Use  . Smoking status: Never Smoker  . Smokeless tobacco: Never Used  Vaping Use  . Vaping Use: Never used  Substance and Sexual Activity  . Alcohol use: Not Currently    Alcohol/week: 0.0 standard drinks  . Drug use: No  . Sexual activity: Yes    Birth control/protection: Surgical    Comment: tubal ligation  Other Topics Concern  . Not on file  Social History Narrative  . Not on file   Social Determinants of Health   Financial Resource Strain: Not on file  Food Insecurity: Not on file  Transportation Needs: Not on file  Physical Activity: Not on file  Stress: Not on file  Social Connections: Not on file  Intimate  Partner Violence: Not on file    ROS Review of Systems  Constitutional: Negative for chills, fatigue, fever and unexpected weight change.  Respiratory: Negative for cough, chest tightness, shortness of breath and wheezing.   Cardiovascular: Negative for chest pain, palpitations and leg swelling.  Genitourinary: Negative for dysuria, frequency and urgency.  Musculoskeletal: Positive for back pain.  Neurological: Positive for weakness and numbness. Negative for dizziness, light-headedness and headaches.  Psychiatric/Behavioral: Negative for dysphoric mood, self-injury, sleep disturbance and suicidal ideas. The patient is not nervous/anxious.     Objective:   Today's Vitals: BP 115/74   Pulse 67   Temp 98.7 F (37.1 C)   Ht 5' 3.5" (1.613 m)   Wt (!) 370 lb 8  oz (168.1 kg)   LMP 06/19/2020   SpO2 99%   BMI 64.60 kg/m   Physical Exam Vitals reviewed.  Constitutional:      General: She is not in acute distress.    Appearance: Normal appearance.  HENT:     Head: Normocephalic and atraumatic.  Cardiovascular:     Rate and Rhythm: Normal rate and regular rhythm.     Pulses: Normal pulses.     Heart sounds: Normal heart sounds. No murmur heard. No friction rub. No gallop.   Pulmonary:     Effort: Pulmonary effort is normal. No respiratory distress.     Breath sounds: Normal breath sounds. No wheezing.  Skin:    General: Skin is warm and dry.  Neurological:     Mental Status: She is alert and oriented to person, place, and time.  Psychiatric:        Mood and Affect: Mood normal.        Behavior: Behavior normal.        Thought Content: Thought content normal.        Judgment: Judgment normal.    Assessment & Plan:   1. Encounter to establish care Reviewed available information and discussed care concerns with patient.   2. Morbid obesity with body mass index of 60.0-69.9 in adult (HCC) Checking hemoglobin A1c and TSH.  - TSH - Hemoglobin A1c  3. Chronic bilateral low back pain with right-sided sciatica Prednisone 50mg  daily x 5 days. After completing prednisone, start Celebrex 200mg  BID for 2 weeks then BID as needed. Referring to PT. Lumbar spine x-rays today. Will likely need to repeat MRI since her last one was almost 10 years ago.  - Ambulatory referral to Physical Therapy - DG Lumbar Spine Complete; Future  4. Preventative health care Checking CBC w/diff, CMP, and Lipid panel today.  - CBC with Differential/Platelet - COMPLETE METABOLIC PANEL WITH GFR - Lipid panel  5. Need for hepatitis C screening test Discussed screening recommendations. Patient agreeable so adding to blood work today. - Hepatitis C antibody  Outpatient Encounter Medications as of 06/21/2020  Medication Sig  . celecoxib (CELEBREX) 200 MG capsule  Take 1 capsule (200 mg total) by mouth 2 (two) times daily as needed.  . predniSONE (DELTASONE) 50 MG tablet Take 1 tablet (50 mg total) by mouth daily.  . [DISCONTINUED] acetaminophen (TYLENOL) 325 MG tablet Take 650 mg by mouth every 6 (six) hours as needed for fever or headache.  . [DISCONTINUED] albuterol (VENTOLIN HFA) 108 (90 Base) MCG/ACT inhaler Inhale 1-2 puffs into the lungs every 6 (six) hours as needed for wheezing or shortness of breath.  . [DISCONTINUED] cyclobenzaprine (FLEXERIL) 10 MG tablet Take 1 tablet (10 mg total) by mouth 3 (three) times daily  as needed for muscle spasms. DO NOT DRINK ALCOHOL OR DRIVE WHILE TAKING THIS MEDICATION  . [DISCONTINUED] ibuprofen (ADVIL) 200 MG tablet Take 2-3 tablets (400-600 mg total) by mouth every 6 (six) hours as needed for moderate pain.  . [DISCONTINUED] promethazine-codeine (PHENERGAN WITH CODEINE) 6.25-10 MG/5ML syrup Take 5-10 mLs by mouth at bedtime as needed for cough (at bedtime only).   No facility-administered encounter medications on file as of 06/21/2020.    Follow-up: Return in about 2 weeks (around 07/05/2020) for back pain follow up.   Thayer Ohm, DNP, APRN, FNP-BC  [facility]

## 2020-06-21 NOTE — Patient Instructions (Signed)
Weight loss tips and tricks:  1.  Make sure to drink at least 64 ounces of water every day. 2.  Avoid alcohol. 3.  Avoid eating within 3 hours of going to bed. 4.  Cut out sugary drinks such as sweet tea, regular sodas, energy drinks, etc. 5.  Make sure you are getting enough sleep every night. 6.  Start making changes by cutting back portion sizes by 1/3. 7.  Keep a food diary to help identify areas for improvement and promote awareness of bad habits. 8.  Increase your activity.  Choose something you like to do that is fun for you so you are more likely to stick with it. 9.  Do not forget your protein! 10.  Measure your neck, upper arms, waist, hips, and thighs and write those measurements down somewhere before you start your weight loss journey.  Changes in your measurements will tell a far more accurate story than the number on a scale. 11.  As you go, pay attention to how your clothes fit! 12.  Weigh yourself as often or as little as you need to.  Some folks do better weighing every day while others do better with once a week. 13.  Do not get discouraged!  Weight loss efforts are meant to be lifestyle changes.  Once you stop a medication (if you are taking 1), your behaviors and habits will determine if you maintain your weight loss or not.  For those on medications:  1.  Take your medication as prescribed every day first thing in the morning. 2.  Common side effects include nausea and constipation.  To combat this, increased your daily water consumption.  Consider adding in a stool softener (available OTC) if needed.  Also increase fiber intake with vegetables and fruits. 3.  If you have any side effects or concerns while taking medication, please do not hesitate to reach out to us here at the office. 4.  While on weight loss medications, we do require you to follow-up every 4 weeks with an in office visit.  Refills will not be called in early on controlled substances.  Good luck on your  weight loss journey!  Have faith in your self and you will reach your goals!  

## 2020-06-22 ENCOUNTER — Other Ambulatory Visit: Payer: Self-pay | Admitting: Medical-Surgical

## 2020-06-22 DIAGNOSIS — G8929 Other chronic pain: Secondary | ICD-10-CM

## 2020-06-22 DIAGNOSIS — M5441 Lumbago with sciatica, right side: Secondary | ICD-10-CM

## 2020-06-24 ENCOUNTER — Telehealth: Payer: Self-pay

## 2020-06-24 DIAGNOSIS — Z6841 Body Mass Index (BMI) 40.0 and over, adult: Secondary | ICD-10-CM

## 2020-06-24 LAB — CBC WITH DIFFERENTIAL/PLATELET
Absolute Monocytes: 359 cells/uL (ref 200–950)
Basophils Absolute: 23 cells/uL (ref 0–200)
Basophils Relative: 0.3 %
Eosinophils Absolute: 148 cells/uL (ref 15–500)
Eosinophils Relative: 1.9 %
HCT: 39.5 % (ref 35.0–45.0)
Hemoglobin: 12.3 g/dL (ref 11.7–15.5)
Lymphs Abs: 1825 cells/uL (ref 850–3900)
MCH: 24.8 pg — ABNORMAL LOW (ref 27.0–33.0)
MCHC: 31.1 g/dL — ABNORMAL LOW (ref 32.0–36.0)
MCV: 79.6 fL — ABNORMAL LOW (ref 80.0–100.0)
MPV: 13.8 fL — ABNORMAL HIGH (ref 7.5–12.5)
Monocytes Relative: 4.6 %
Neutro Abs: 5444 cells/uL (ref 1500–7800)
Neutrophils Relative %: 69.8 %
Platelets: 243 10*3/uL (ref 140–400)
RBC: 4.96 10*6/uL (ref 3.80–5.10)
RDW: 13.9 % (ref 11.0–15.0)
Total Lymphocyte: 23.4 %
WBC: 7.8 10*3/uL (ref 3.8–10.8)

## 2020-06-24 LAB — HEMOGLOBIN A1C
Hgb A1c MFr Bld: 6 % of total Hgb — ABNORMAL HIGH (ref <5.7)
Mean Plasma Glucose: 126 mg/dL
eAG (mmol/L): 7 mmol/L

## 2020-06-24 LAB — COMPLETE METABOLIC PANEL WITH GFR
AG Ratio: 1.3 (calc) (ref 1.0–2.5)
ALT: 10 U/L (ref 6–29)
AST: 14 U/L (ref 10–30)
Albumin: 4.1 g/dL (ref 3.6–5.1)
Alkaline phosphatase (APISO): 38 U/L (ref 31–125)
BUN: 15 mg/dL (ref 7–25)
CO2: 26 mmol/L (ref 20–32)
Calcium: 9.1 mg/dL (ref 8.6–10.2)
Chloride: 109 mmol/L (ref 98–110)
Creat: 0.8 mg/dL (ref 0.50–1.10)
GFR, Est African American: 106 mL/min/{1.73_m2} (ref >=60)
GFR, Est Non African American: 92 mL/min/{1.73_m2} (ref >=60)
Globulin: 3.1 g/dL (calc) (ref 1.9–3.7)
Glucose, Bld: 85 mg/dL (ref 65–99)
Potassium: 4.5 mmol/L (ref 3.5–5.3)
Sodium: 142 mmol/L (ref 135–146)
Total Bilirubin: 0.2 mg/dL (ref 0.2–1.2)
Total Protein: 7.2 g/dL (ref 6.1–8.1)

## 2020-06-24 LAB — HEPATITIS C ANTIBODY
Hepatitis C Ab: NONREACTIVE
SIGNAL TO CUT-OFF: 0.02 (ref <1.00)

## 2020-06-24 LAB — IRON, TOTAL/TOTAL IRON BINDING CAP
%SAT: 7 % (calc) — ABNORMAL LOW (ref 16–45)
Iron: 27 ug/dL — ABNORMAL LOW (ref 40–190)
TIBC: 394 mcg/dL (calc) (ref 250–450)

## 2020-06-24 LAB — TSH: TSH: 1.68 mIU/L

## 2020-06-24 LAB — LIPID PANEL
Cholesterol: 172 mg/dL (ref <200)
HDL: 60 mg/dL (ref >=50)
LDL Cholesterol (Calc): 96 mg/dL (calc)
Non-HDL Cholesterol (Calc): 112 mg/dL (calc) (ref <130)
Total CHOL/HDL Ratio: 2.9 (calc) (ref <5.0)
Triglycerides: 72 mg/dL (ref <150)

## 2020-06-24 MED ORDER — PHENTERMINE HCL 37.5 MG PO TABS: ORAL_TABLET | ORAL | 0 refills | Status: DC

## 2020-06-24 NOTE — Telephone Encounter (Signed)
Pt aware Rx has been sent to the pharmacy. She is currently scheduled for a f/up on 07/05/2020. No further questions or concerns at this time.

## 2020-06-24 NOTE — Telephone Encounter (Signed)
Phentermine sent to pharmacy with instructions to take 1/2 tablet every morning.  We will need to follow-up with an in person visit in 4 weeks for weight check.  Thayer Ohm, DNP, APRN, FNP-BC [facility]

## 2020-06-24 NOTE — Telephone Encounter (Signed)
Pt called and LVM stating she spoke with her insurance company and they said they will not pay for any meds for weight loss. She is wanting to get a Rx for phentermine and use the GoodRx coupon to get it and will pay out-of-pocket.   Looking at the [employer] and the prices for phentermine, she would like the Rx to be sent to [facility] in [place]. Rx has been tee'd up below and ready for review and approval/denial. Pharmacy updated as well.

## 2020-06-27 MED ORDER — FERROUS SULFATE 325 (65 FE) MG PO TBEC: 325.0000 mg | DELAYED_RELEASE_TABLET | Freq: Every day | ORAL | 3 refills | Status: DC

## 2020-06-27 NOTE — Addendum Note (Signed)
Addended byChristen Butter on: 06/27/2020 10:56 AM   Modules accepted: Orders

## 2020-06-27 NOTE — Addendum Note (Signed)
Addended byChristen Butter on: 06/27/2020 10:55 AM   Modules accepted: Orders

## 2020-07-05 ENCOUNTER — Ambulatory Visit: Payer: Medicaid Other | Admitting: Medical-Surgical

## 2020-07-11 ENCOUNTER — Ambulatory Visit
Admission: RE | Admit: 2020-07-11 | Discharge: 2020-07-11 | Disposition: A | Discharge status: Home / Self Care | Payer: Medicaid Other | Source: Ambulatory Visit | Attending: Medical-Surgical | Admitting: Medical-Surgical

## 2020-07-11 ENCOUNTER — Other Ambulatory Visit: Payer: Self-pay

## 2020-07-11 DIAGNOSIS — G8929 Other chronic pain: Secondary | ICD-10-CM

## 2020-07-11 DIAGNOSIS — M545 Low back pain, unspecified: Secondary | ICD-10-CM | POA: Diagnosis not present

## 2020-07-13 ENCOUNTER — Ambulatory Visit: Payer: Medicaid Other | Attending: Medical-Surgical | Admitting: Physical Therapy

## 2020-07-13 ENCOUNTER — Encounter: Payer: Self-pay | Admitting: Physical Therapy

## 2020-07-13 ENCOUNTER — Other Ambulatory Visit: Payer: Self-pay

## 2020-07-13 DIAGNOSIS — M62830 Muscle spasm of back: Secondary | ICD-10-CM | POA: Insufficient documentation

## 2020-07-13 DIAGNOSIS — M545 Low back pain, unspecified: Secondary | ICD-10-CM | POA: Diagnosis not present

## 2020-07-13 DIAGNOSIS — M6281 Muscle weakness (generalized): Secondary | ICD-10-CM | POA: Diagnosis not present

## 2020-07-13 NOTE — Therapy (Addendum)
[facility] [address] [place], [place], [address] Phone: [phone]   Fax:  [phone]  Physical Therapy Evaluation  Patient Details  Name: [name] MRN: [id number] Date of Birth: 1981-02-22 Referring Provider (PT): Christen Butter, NP   Encounter Date: 07/13/2020   PT End of Session - 07/13/20 1105     Visit Number 1    Number of Visits 8    Date for PT Re-Evaluation 09/07/20    Authorization Type [employer]    PT Start Time 0840    PT Stop Time 0925    PT Time Calculation (min) 45 min    Activity Tolerance Patient limited by pain;Patient tolerated treatment well    Behavior During Therapy [facility] for tasks assessed/performed             Past Medical History:  Diagnosis Date   [redacted] weeks gestation of pregnancy    Advanced maternal age in multigravida 01/17/2015   Arthritis    "back; right foot; left ankle" (07/22/2014)   Cellulitis of leg, right 07/2014   Chronic lower back pain    History of COVID-19 06/21/2020   Morbid obesity with BMI of 50.0-59.9, adult (HCC)    Prediabetes    S/P cesarean section 07/20/2015   Vaginal delivery 1998    Past Surgical History:  Procedure Laterality Date   CESAREAN SECTION  1994; 2010   CESAREAN SECTION N/A 07/20/2015   Procedure: REPEAT CESAREAN SECTION;  Surgeon: Brock Bad, MD;  Location: [facility];  Service: Obstetrics;  Laterality: N/A;   DILATION AND CURETTAGE OF UTERUS  ~ 2008   TUBAL LIGATION      There were no vitals filed for this visit.    Subjective Assessment - 07/13/20 0842     Subjective I have LBP, 'I'm not sure what you can do to help me'. It started out as R sided but now has moved to the whole back. My imaging results said that I had 'leaking discs with the fluid leaking down into my hip, knee, and ankle and it is causing swelling). I have been gaving back pain for 7-8 years when I injured my back, but it has been getting worse recently. It seems  to correlate with when I started trying to work out more. My back pain has been getting so bad that I am loosing my balance and falling. I am even having a hard time with my personal hygiene, like showering and turning around to perform self hygiene. I'm interested in aquatic therapy as swimming makes my back feel better.    Limitations Sitting;Lifting;Standing;Walking;House hold activities    How long can you sit comfortably? sitting is okay with back support    How long can you stand comfortably? 10-15 minutes    How long can you walk comfortably? 10-15 minutes    Diagnostic tests MRI 07/11/2020- 1. Essentially unremarkable MRI of the lumbar spine. No significant  disc protrusion, foraminal stenosis, or canal stenosis at any level.  2. 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    Patient Stated Goals don't want to be in pain    Currently in Pain? Yes    Pain Score 5     Pain Location Back    Pain Orientation Right;Left    Pain Descriptors / Indicators Stabbing;Radiating    Pain Type Chronic pain;Acute pain    Pain Radiating Towards back part of thigh    Pain Onset More than a month ago  Pain Frequency Constant    Aggravating Factors  standing, walking,    Pain Relieving Factors swimming, ibuprofen and naproxen, massages    Effect of Pain on Daily Activities ADLs/IADLs, walking, sitting, resting, daily activity                  [facility] PT Assessment - 07/13/20 0001       Assessment   Medical Diagnosis Chronic bilateral low back pain with right-sided sciatica    Referring Provider (PT) Christen Butter, NP    Onset Date/Surgical Date --   7-8 years chronic LBP but increased acute LBP past couple of motnhs   Next MD Visit not scheduled      Precautions   Precautions None      Restrictions   Weight Bearing Restrictions No      Balance Screen   Has the patient fallen in the past 6 months Yes    How many times? 4   trying to get up from the chair, standing and fell trying to sit    Has the patient had a decrease in activity level because of a fear of falling?  No    Is the patient reluctant to leave their home because of a fear of falling?  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      Cognition   Overall Cognitive Status Within Functional Limits for tasks assessed      Observation/Other Assessments   Observations pt shifting side to side sitting unabble to maintain positions for long during sitting    Focus on Therapeutic Outcomes (FOTO)  MCD- not assessed      ROM / Strength   AROM / PROM / Strength AROM;Strength      AROM   Overall AROM  Due to pain;Deficits    AROM Assessment Site Lumbar    Lumbar Flexion just above toes   pulling in back R side   Lumbar Extension 50% limited   painful   Lumbar - Right Side Bend above joint line    Lumbar - Left Side Bend above joint line    Lumbar - Right Rotation WNL   painful   Lumbar - Left Rotation WNL   painful     Strength   Strength Assessment Site Hip;Knee;Ankle    Right/Left Hip Right;Left    Right Hip Flexion 4/5    Right Hip Extension 4+/5   seated   Right Hip ABduction 3+/5   painful   Left Hip Flexion 5/5    Left Hip ABduction 4+/5    Right/Left Knee Right;Left    Right Knee Flexion 4+/5    Right Knee Extension 4/5    Left Knee Flexion 5/5    Left Knee Extension 5/5    Right/Left Ankle Right;Left    Right Ankle Dorsiflexion 4+/5    Left Ankle Dorsiflexion 5/5      Palpation   Palpation comment TTP sidelying on R side @ superior part of glut max, piriformis region, general lumbar area, and with gentle CPA's in sidelying      Special Tests  Other special tests SLR- negative  bilaterally, able to get to 90 degrees passively with legs, overpressure into dorsiflexion bilaterally just caused tightness      Balance   Balance Assessed Yes      High Level Balance   High Level Balance Comments rhomberg eyes open and closed-30 seconds no LOB; 3/4 tandem bilaterally eyes open and closed-30 seconds no LOB; full tandem bilaterally eyes closed L in front of R-15 seconds; full tandem R in front of L tandem eyes closed-3 seconds                                Objective measurements completed on examination: See above findings.          [facility] Adult PT Treatment/Exercise - 07/13/20 0001       High Level Balance   High Level Balance Comments tandem bilaterally 30 seconds eyes open      Exercises   Exercises Knee/Hip      Knee/Hip Exercises: Stretches   Other Knee/Hip Stretches LTR- no stretch felt; SKTC with strap- no stretch felt      Knee/Hip Exercises: Standing   Hip Abduction 10 reps;Both    Hip Extension Both;10 reps      Knee/Hip Exercises: Supine   Bridges 5 reps    Bridges Limitations painful, d/c    Other Supine Knee/Hip Exercises clamshells red band-no resistance felt      Knee/Hip Exercises: Sidelying   Clams x10, R>L pain, instructed to only perform on L side                          PT Education - 07/13/20 1104     Education Details HEP, sx explanation, POC, aquatic therapy    Person(s) Educated Patient    Methods Explanation;Demonstration;Tactile cues;Verbal cues;Handout    Comprehension Verbalized understanding;Returned demonstration;Need further instruction              PT Short Term Goals - 07/13/20 1127       PT SHORT TERM GOAL #1   Title Pt will be independent with initial HEP    Baseline given at eval    Time 2    Period Weeks    Status New    Target Date 07/27/20      PT SHORT TERM GOAL #2   Title Pt will achieve R hip abduction 4/5 MMT in order to help increase ambulation ability    Baseline  3+/5 R abudction MMT    Time 3    Period Weeks    Status New    Target Date 08/03/20      PT SHORT TERM GOAL #3   Title 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    Baseline 10-15 minutes    Time 3    Period Weeks    Status New    Target Date 08/03/20                PT Long Term Goals - 07/13/20 1128       PT LONG TERM GOAL #1   Title Pt will be independent with final HEP    Baseline not given yet    Time 8    Period Weeks    Status New    Target Date 09/07/20      PT LONG TERM GOAL #  2   Title Pt will be able to maintain bilateral tandem with closed eyes for 30 seconds to decrease fall risk.    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    Baseline R hip extension 4+/5   R hip fleixon 4/5  L hip abduction  R knee flexion 4+/5  R knee extension 4/5 R ankle dorsiflexion 4+/5    Time 8    Period Weeks    Status New    Target Date 09/07/20      PT LONG TERM GOAL #5   Title Pt will achieve R hip abudction of 4/5 MMT strength in order to help increase activity and job as a CNA    Baseline R hip abduction 3+/5    Time 8    Period Weeks    Status New    Target Date 09/07/20                         Plan - 07/13/20 1111     Clinical Impression Statement Pt is a 40 y/o F presenting to OPPT with acute on chronic LBP with R sided. Examination revealed balance difficulty with somatosensory and visual  challenges, R>L LE strength deficits especially with R hip abduction, and TTP in general glut/LE region. Examination reveals - SLR despite pt stating she has radicular sx into the R leg. Pt unable to tolerate prone during session. Pt given initial HEP to work on hip abduction and extension strength and somatosensory challenges with balance. Pt would benefit from skilled PT in order to address general R>L LE strength deficits, core strengthening, balance, and work on increasing general mobility and ambulation in order to decrease fall risk, increase LE/core strength, and help with her job duties as a Lawyer. She would also benefit from aquatic PT in order to increase exercise tolerance in the water to then eventually transfer onto land; pt also reports decrease in back pain in the pool    Personal Factors and Comorbidities Comorbidity 3+;Fitness;Profession    Comorbidities covid 03/29/20, morbid obesity, arthritis    Examination-Activity Limitations Bathing;Bed Mobility;Hygiene/Grooming;Dressing;Lift;Stand;Stairs;Squat;Sit;Sleep;Carry;Caring for Others;Bend;Locomotion Level    Examination-Participation Restrictions Cleaning;Church;Yard Work;Volunteer;Laundry;Shop;Occupation;Meal Prep;Community Activity    Stability/Clinical Decision Making Evolving/Moderate complexity    Clinical Decision Making Moderate    Rehab Potential Good    PT Frequency 1x / week    PT Duration 8 weeks    PT Treatment/Interventions ADLs/Self Care Home Management;Aquatic Therapy;Spinal Manipulations;Joint Manipulations;Dry needling;Passive range of motion;Patient/family education;Manual techniques;Balance training;Therapeutic exercise;Neuromuscular re-education;Therapeutic activities;Functional mobility training;Stair training;Gait training;Moist Heat;Cryotherapy;Electrical Stimulation;Taping    PT Next Visit Plan aquatic PT, test sensation, core strengthening, standing exercises to target LE strength    PT Home Exercise  Plan [id number]     Consulted and Agree with Plan of Care Patient             Patient will benefit from skilled therapeutic intervention in order to improve the following deficits and impairments:  Difficulty walking,Postural dysfunction,Increased edema,Decreased strength,Decreased mobility,Improper body mechanics,Pain,Obesity,Decreased balance,Decreased activity tolerance,Increased muscle spasms,Decreased endurance  Visit Diagnosis: Low back pain, unspecified back pain laterality, unspecified chronicity, unspecified whether sciatica present  Muscle spasm of back  Muscle weakness (generalized)      Problem List Patient Active Problem List   Diagnosis Date Noted   Chronic bilateral low back pain with right-sided sciatica 06/21/2020   Gastroenteritis 06/16/2015   Anemia 07/24/2014   Morbid obesity with body mass index of 60.0-69.9 in adult [facility](HCC) 07/22/2014   Cellulitis of right lower extremity 07/22/2014   Prediabetes 07/22/2014   Fissure in skin of foot 07/22/2014   GERD without esophagitis 08/28/2012    Jeri CosCarly Dominic Mahaney, SPT 07/13/2020, 1:39 PM  [facility] [address] [place], [place], [address] Phone: [phone]   Fax:  [phone]  Name: [name] MRN: [id number] Date of Birth: 1980-11-30   Check all possible CPT codes: 97110- Therapeutic Exercise, [phone] [id number]- Neuro Re-education, [phone] - Gait Training, [phone] - Manual Therapy, [phone] - Therapeutic Activities, [phone] - Self Care, [phone] - Electrical stimulation (unattended), and [id number] - Aquatic therapy

## 2020-07-13 NOTE — Patient Instructions (Signed)
Access Code: [email or web address] URL: [email or web address] Date: 07/13/2020 Prepared by: Jeri Cos  Exercises  Clamshell with Resistance - 1 x daily - 7 x weekly - 2-3 sets - 10 reps Standing Hip Extension with Counter Support - 1 x daily - 7 x weekly - 3 sets - 10 reps Standing Hip Abduction with Counter Support - 1 x daily - 7 x weekly - 3 sets - 10 reps Standing Tandem Balance with Counter Support - 1 x daily - 7 x weekly - 3 sets - 30 seconds hold

## 2020-08-10 ENCOUNTER — Ambulatory Visit: Payer: Medicaid Other | Admitting: Physical Therapy

## 2020-08-17 ENCOUNTER — Ambulatory Visit: Payer: Medicaid Other | Admitting: Physical Therapy

## 2020-08-24 ENCOUNTER — Other Ambulatory Visit: Payer: Self-pay

## 2020-08-24 ENCOUNTER — Ambulatory Visit: Payer: Medicaid Other | Attending: Medical-Surgical | Admitting: Physical Therapy

## 2020-08-24 ENCOUNTER — Encounter: Payer: Self-pay | Admitting: Physical Therapy

## 2020-08-24 DIAGNOSIS — M545 Low back pain, unspecified: Secondary | ICD-10-CM | POA: Insufficient documentation

## 2020-08-24 DIAGNOSIS — M6281 Muscle weakness (generalized): Secondary | ICD-10-CM | POA: Diagnosis not present

## 2020-08-24 DIAGNOSIS — M62830 Muscle spasm of back: Secondary | ICD-10-CM | POA: Diagnosis not present

## 2020-08-24 NOTE — Therapy (Signed)
[facility] [address] [place], [place], [address] Phone: [phone]   Fax:  [phone]  Physical Therapy Treatment  Patient Details  Name: [name] MRN: [id number] Date of Birth: Jul 17, 1980 Referring Provider (PT): Christen Butter, NP   Encounter Date: 08/24/2020   PT End of Session - 08/24/20 1514    Visit Number 2    Number of Visits 8    Date for PT Re-Evaluation 09/07/20    Authorization Type [employer]    PT Start Time 1348    PT Stop Time 1420    PT Time Calculation (min) 32 min    Activity Tolerance Patient limited by pain;Patient tolerated treatment well    Behavior During Therapy [facility] for tasks assessed/performed           Past Medical History:  Diagnosis Date  . [redacted] weeks gestation of pregnancy   . Advanced maternal age in multigravida 01/17/2015  . Arthritis    "back; right foot; left ankle" (07/22/2014)  . Cellulitis of leg, right 07/2014  . Chronic lower back pain   . History of COVID-19 06/21/2020  . Morbid obesity with BMI of 50.0-59.9, adult (HCC)   . Prediabetes   . S/P cesarean section 07/20/2015  . Vaginal delivery 1998    Past Surgical History:  Procedure Laterality Date  . CESAREAN SECTION  1994; 2010  . CESAREAN SECTION N/A 07/20/2015   Procedure: REPEAT CESAREAN SECTION;  Surgeon: Brock Bad, MD;  Location: [facility];  Service: Obstetrics;  Laterality: N/A;  . DILATION AND CURETTAGE OF UTERUS  ~ 2008  . TUBAL LIGATION      There were no vitals filed for this visit.   Subjective Assessment - 08/24/20 1516    Subjective Pt with 6/10 pain in R LB today entering aquatic pool.   Pt had relief of pain by being in pool at 50% to 80% submerged in water to waist and chest deep    Limitations Sitting;Lifting;Standing;Walking;House hold activities    Diagnostic tests MRI 07/11/2020- 1. Essentially unremarkable MRI of the lumbar spine. No significant  disc protrusion, foraminal stenosis, or  canal stenosis at any level.  2. 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    Patient Stated Goals don't want to be in pain    Currently in Pain? Yes    Pain Score 6     Pain Location Back    Pain Orientation Right;Left    Pain Descriptors / Indicators Radiating;Throbbing    Pain Type Chronic pain    Pain Onset More than a month ago              Aquatic therapy at [facility] [address] - therapeutic pool temp 88-90  degrees Pt enters building without AD.  Treatment took place in water 3.8 to  4 ft 8 in.feet deep depending upon activity. Pt arrived to [address] but miscommunication from Reception area caused pt to be late to appt. Pt received 30 min of Aquatic therapy to introduce aquatic concepts and initial HEP  Pt entered and exited the pool via stair and handrails independently.     [name] entered water for aquatic therapy for first time and was introduced to principles and therapeutic effects of water as she ambulated and acclimated to pool. Marland Kitchen    She was able to ambulate across pool 15 ft x 4, then side steps x 2 and then ambulates backward x 2.    She was able to  use aquatic barbells while ambulating to increase abdominal engagement with exercises.   Pt educated on neutral posture and hip hinging in seated position with water at chest level x 10 with stretch to low back and then x 10 with back at pool wall at external cue, VC for neck tucked to prevent hyperextension.   Runners stretch  2 x Left and then moves into hamstring stretch  Then runners stretch on R and then move into hamstring stretch . TC to insure proper technique. Figure 4 squat stretch with UE support for R and L x 60 sec each          On edge of pool with bil UE support   Pt performed LE exercise  Hip abd/add R/L 10 x each and then using 1 UE support Hip ext/flex with knee straight x 10, pt needing VC and TC for correct execution and sequencing Squats x 10 reps with intermittent UE support x 2  sets. Cat/Camel with noodle Triangle pose with noodle   Bad Ragaz, Pt with lumbar belt around hips and nek doodle for neck support.  .  Pt assisted into supine floating position by lying head on shoulder of PT to get into floating position. Pt with apprehension and stayed at head of pt.. PT  assisting with trunk left to right and vice versa to engage trunk muscles. PT then rotated trunk in order to engage abdomnal (internal and external obliques)  Emphasis on breathing techniques to draw in abdominals for support.  Pt then utilizing posterior chain and engaging Hip extension and knee flexion with water resistance while PT used Aquastretch techniques to decrease muscle tension in low back on Bil gluteals and Bil low back        Pt requires the buoyancy of water for active assisted exercises with buoyancy supported for strengthening and AROM exercises: PT  requires the viscosity of the water for resistance with strengthening exercises Hydrostatic pressure also supports joints by unweighting joint load by at least 50 % in 3-4 feet depth water. 80% in chest to neck deep water. Water will allow for  reduced joint loading through buoyancy to help patient improve posture without excess stress and pain.                         PT Education - 08/24/20 1517    Education Details Pt introduced to [employer] and principles of water and therapeutic benefits    Person(s) Educated Patient    Methods Explanation;Demonstration;Tactile cues;Verbal cues    Comprehension Verbalized understanding;Returned demonstration            PT Short Term Goals - 07/13/20 1127      PT SHORT TERM GOAL #1   Title Pt will be independent with initial HEP    Baseline given at eval    Time 2    Period Weeks    Status New    Target Date 07/27/20      PT SHORT TERM GOAL #2   Title Pt will achieve R hip abduction 4/5 MMT in order to help increase ambulation ability    Baseline 3+/5 R abudction MMT     Time 3    Period Weeks    Status New    Target Date 08/03/20      PT SHORT TERM GOAL #3   Title 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  Baseline 10-15 minutes    Time 3    Period Weeks    Status New    Target Date 08/03/20             PT Long Term Goals - 07/13/20 1128      PT LONG TERM GOAL #1   Title Pt will be independent with final HEP    Baseline not given yet    Time 8    Period Weeks    Status New    Target Date 09/07/20      PT LONG TERM GOAL #2   Title Pt will be able to maintain bilateral tandem with closed eyes for 30 seconds to decrease fall risk.    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    Baseline R hip extension 4+/5   R hip fleixon 4/5  L hip abduction  R knee flexion 4+/5  R knee extension 4/5 R ankle dorsiflexion 4+/5    Time 8    Period Weeks    Status New    Target Date 09/07/20      PT LONG TERM GOAL #5   Title Pt will achieve R hip abudction of 4/5 MMT strength 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  Pt had 6/10 pain in low back  especially R QL and was able to reduce pain to 5/10.  Pt able to reduce pain further using Aquastretch and Bad Ragaz.  Pt was able to work on trunk muscle and postural control with perturbations and viscosity of water.  Hydrostatic pressure also supports joints by unweighting joint load by at least 50 % in 3-4 feet depth water. 80% in chest to neck deep water.  Water will allow for  reduced joint loading through buoyancy to help patient improve posture without excess stress and pain.    Personal Factors and Comorbidities Comorbidity 3+;Fitness;Profession    Comorbidities covid 03/29/20, morbid obesity, arthritis    Examination-Activity Limitations Bathing;Bed Mobility;Hygiene/Grooming;Dressing;Lift;Stand;Stairs;Squat;Sit;Sleep;Carry;Caring for Others;Bend;Locomotion Level    Examination-Participation Restrictions Cleaning;Church;Yard Work;Volunteer;Laundry;Shop;Occupation;Meal Prep;Community Activity    PT Frequency 1x / week    PT Duration 8 weeks    PT Treatment/Interventions ADLs/Self Care Home Management;Aquatic Therapy;Spinal Manipulations;Joint Manipulations;Dry needling;Passive  range of motion;Patient/family education;Manual techniques;Balance training;Therapeutic exercise;Neuromuscular re-education;Therapeutic activities;Functional mobility training;Stair training;Gait training;Moist Heat;Cryotherapy;Electrical Stimulation;Taping    PT Next Visit Plan aquatic PT, test sensation, core strengthening, standing exercises to target LE strength    PT Home Exercise Plan [id number]    Consulted and Agree with Plan of Care Patient           Patient will benefit from skilled therapeutic intervention in order to improve the following deficits and impairments:  Difficulty walking,Postural dysfunction,Increased edema,Decreased strength,Decreased mobility,Improper body mechanics,Pain,Obesity,Decreased balance,Decreased activity tolerance,Increased muscle spasms,Decreased endurance  Visit Diagnosis: Low  back pain, unspecified back pain laterality, unspecified chronicity, unspecified whether sciatica present  Muscle spasm of back  Muscle weakness (generalized)     Problem List Patient Active Problem List   Diagnosis Date Noted  . Chronic bilateral low back pain with right-sided sciatica 06/21/2020  . Gastroenteritis 06/16/2015  . Anemia 07/24/2014  . Morbid obesity with body mass index of 60.0-69.9 in adult [facility]) 07/22/2014  . Cellulitis of right lower extremity 07/22/2014  . Prediabetes 07/22/2014  . Fissure in skin of foot 07/22/2014  . GERD without esophagitis 08/28/2012     Garen Lah, PT, [facility] Certified Exercise Expert for the Aging Adult  08/24/20 5:43 PM Phone: [phone] Fax: [phone]  [facility] [address] [place], [place], [address] Phone: [phone]   Fax:  [phone]  Name: [name] MRN: [id number] Date of Birth: 26-Mar-1981

## 2020-08-31 ENCOUNTER — Other Ambulatory Visit: Payer: Self-pay

## 2020-08-31 ENCOUNTER — Ambulatory Visit: Payer: Medicaid Other | Admitting: Physical Therapy

## 2020-08-31 ENCOUNTER — Ambulatory Visit: Payer: Medicaid Other | Attending: Medical-Surgical | Admitting: Physical Therapy

## 2020-08-31 ENCOUNTER — Encounter: Payer: Self-pay | Admitting: Physical Therapy

## 2020-08-31 DIAGNOSIS — M545 Low back pain, unspecified: Secondary | ICD-10-CM | POA: Diagnosis not present

## 2020-08-31 DIAGNOSIS — M6281 Muscle weakness (generalized): Secondary | ICD-10-CM | POA: Insufficient documentation

## 2020-08-31 DIAGNOSIS — M62830 Muscle spasm of back: Secondary | ICD-10-CM | POA: Diagnosis not present

## 2020-08-31 NOTE — Therapy (Addendum)
[place], [place], [address] Phone: [phone]   Fax:  [phone]  Physical Therapy Treatment/Discharge Note  Patient Details  Name: [name] MRN: [id number] Date of Birth: 12/18/1980 Referring Provider (PT): Samuel Bouche, NP   Encounter Date: 08/31/2020   PT End of Session - 08/31/20 1344     Visit Number 3    Number of Visits 8    Date for PT Re-Evaluation 09/07/20    Authorization Type [employer]    PT Start Time 1245    PT Stop Time 1330    PT Time Calculation (min) 45 min    Activity Tolerance Patient tolerated treatment well    Behavior During Therapy [facility] for tasks assessed/performed             Past Medical History:  Diagnosis Date   [redacted] weeks gestation of pregnancy    Advanced maternal age in multigravida 01/17/2015   Arthritis    "back; right foot; left ankle" (07/22/2014)   Cellulitis of leg, right 07/2014   Chronic lower back pain    History of COVID-19 06/21/2020   Morbid obesity with BMI of 50.0-59.9, adult ([place])    Prediabetes    S/P cesarean section 07/20/2015   Vaginal delivery 1998    Past Surgical History:  Procedure Laterality Date   [place]; 2010   CESAREAN SECTION N/A 07/20/2015   Procedure: REPEAT CESAREAN SECTION;  Surgeon: Shelly Bombard, MD;  Location: [facility];  Service: Obstetrics;  Laterality: N/A;   DILATION AND CURETTAGE OF UTERUS  ~ 2008   TUBAL LIGATION      There were no vitals filed for this visit.   Subjective Assessment - 08/31/20 1343     Subjective Pt enters clinic 5/10 pain in R LBP/ R hip radiating pain.  Pt did mention she feels better in the water and she was able to sleep more soundly last week.    Diagnostic tests MRI 07/11/2020- 1. Essentially unremarkable MRI of the lumbar spine. No significant  disc protrusion, foraminal stenosis, or canal stenosis at any level.  2. 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     Patient Stated Goals don't want to be in pain    Currently in Pain? Yes    Pain Score 5     Pain Location Back    Pain Orientation Right    Pain Descriptors / Indicators Radiating;Aching    Pain Type Chronic pain    Pain Onset More than a month ago    Pain Frequency Constant              Aquatic therapy at [address] - therapeutic pool temp 88-90  degrees Pt enters building without AD.  Treatment took place in water 3.8 to  4 ft 8 in.feet deep depending upon activity.   Pt entered and exited the pool via stair and handrails independently.     [name] entered water for aquatic therapy for second  time  And  acclimated to pool. By using aquatic dumbbells submerged as she was able to ambulate across pool 15 ft x 4, then side steps x 4and then ambulates backward x 4. Use of aquatic barbells while ambulating to increase abdominal engagement with exercises. Lunge walk across pool. X 2   Reinforced neutral posture with hip hinge with back at pool wall at external cue, VC for neck tucked to prevent hyperextension. Pt used pool noodle to  insure added stretch and neutral spine with deep hip hinge.   Triangle pose  X 10 R and L with extension of spine Cat camel with pool noodle x 10   Runners stretch  2 x Left and then moves into hamstring stretch  Then runners stretch on R and then move into hamstring stretch . TC to insure proper technique. Figure 4 squat stretch with UE support for R and L x 60 sec each      On edge of pool with bil UE support using weighted ankle cuffs for added resistance Pt performed LE exercise  Hip abd/add R/L 15 x each and then using 1 UE support Hip ext/flex with knee straight x 10, pt needing VC and TC for correct execution and sequencing Ham curls   10 x each R and L Squats x 10 reps with intermittent UE support x 2 sets. On step - step down and step back on R x 10 then step down and step back on L Curtsy lunge x 10 R and L      Bad  Ragaz, Pt with lumbar belt around hips and nek doodle for neck support.  .  Pt assisted into supine floating position by lying head on shoulder of PT to get into floating position. Pt with apprehension but better able to calm self and holding onto pool edge. PT  assisting with trunk left to right and vice versa to engage trunk muscles. PT then rotated trunk in order to engage abdomnal (internal and external obliques)  Emphasis on breathing techniques to draw in abdominals for support.  Pt then utilizing posterior chain and engaging Hip extension and knee flexion with water resistance while PT used Aquastretch techniques to decrease muscle tension in low back on  R gluteals and R low back with decrease of pain to 2-3/10.  LAD to R LE improved spasms of R gluteal and low back region       Pt requires the buoyancy of water for active assisted exercises with buoyancy supported for strengthening and AROM exercises: PT  requires the viscosity of the water for resistance with strengthening exercises Hydrostatic pressure also supports joints by unweighting joint load by at least 50 % in 3-4 feet depth water. 80% in chest to neck deep water. Water will allow for  reduced joint loading through buoyancy to help patient improve posture without excess stress and pain.                         PT Education - 08/31/20 1350     Education Details Pt educated on exercises for low back and working on [employer]) Educated Patient    Methods Explanation;Demonstration;Tactile cues;Verbal cues    Comprehension Verbalized understanding;Returned demonstration              PT Short Term Goals - 07/13/20 1127       PT SHORT TERM GOAL #1   Title Pt will be independent with initial HEP    Baseline given at eval    Time 2    Period Weeks    Status New    Target Date 07/27/20      PT SHORT TERM GOAL #2   Title Pt will achieve R hip abduction 4/5 MMT in order to help increase  ambulation ability    Baseline 3+/5 R abudction MMT    Time 3    Period Weeks    Status  New    Target Date 08/03/20      PT SHORT TERM GOAL #3   Title 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    Baseline 10-15 minutes    Time 3    Period Weeks    Status New    Target Date 08/03/20               PT Long Term Goals - 07/13/20 1128       PT LONG TERM GOAL #1   Title Pt will be independent with final HEP    Baseline not given yet    Time 8    Period Weeks    Status New    Target Date 09/07/20      PT LONG TERM GOAL #2   Title Pt will be able to maintain bilateral tandem with closed eyes for 30 seconds to decrease fall risk.    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  after Rx pt 2-3/10 using aquastretch and Bad Ragaz  techniques on R hip /low back.  Added cuff weights and aquatic barbells to add resistance and challenge to exercises in ambulation/ exercise.  Working on HEP to use a [employer] on her own after next aquatic visit to reinforce.  Hydrostatic pressure supports joints by unweighting joint load by at least 50% in 3-4 feet depth wter.  80% at chest level to neck deep water.  Water allows for reduced joint loading through buoyancy to help improve posture/ body movement without excess stress and pain.    Personal Factors and Comorbidities Comorbidity 3+;Fitness;Profession    Comorbidities covid 03/29/20, morbid obesity, arthritis    Examination-Activity Limitations Bathing;Bed Mobility;Hygiene/Grooming;Dressing;Lift;Stand;Stairs;Squat;Sit;Sleep;Carry;Caring for Others;Bend;Locomotion Level    Examination-Participation  Restrictions Cleaning;Church;Yard Work;Volunteer;Laundry;Shop;Occupation;Meal Prep;Community Activity    PT Frequency 1x / week    PT Duration 8 weeks    PT Treatment/Interventions ADLs/Self Care Home Management;Aquatic Therapy;Spinal Manipulations;Joint Manipulations;Dry needling;Passive range of motion;Patient/family education;Manual techniques;Balance training;Therapeutic exercise;Neuromuscular re-education;Therapeutic activities;Functional mobility training;Stair training;Gait training;Moist Heat;Cryotherapy;Electrical Stimulation;Taping    PT Next Visit Plan aquatic PT, test sensation, core strengthening, standing exercises to target LE strength    PT Home Exercise Plan [id number]    Consulted and Agree with Plan of Care Patient             Patient will benefit from skilled therapeutic intervention in order to improve the following deficits and impairments:  Difficulty walking,Postural dysfunction,Increased edema,Decreased strength,Decreased mobility,Improper body mechanics,Pain,Obesity,Decreased balance,Decreased activity tolerance,Increased muscle spasms,Decreased endurance  Visit  Diagnosis: Low back pain, unspecified back pain laterality, unspecified chronicity, unspecified whether sciatica present  Muscle spasm of back  Muscle weakness (generalized)     Problem List Patient Active Problem List   Diagnosis Date Noted   Chronic bilateral low back pain with right-sided sciatica 06/21/2020   Gastroenteritis 06/16/2015   Anemia 07/24/2014   Morbid obesity with body mass index of 60.0-69.9 in adult [facility]) 07/22/2014   Cellulitis of right lower extremity 07/22/2014   Prediabetes 07/22/2014   Fissure in skin of foot 07/22/2014   GERD without esophagitis 08/28/2012    Voncille Lo, PT, [place] Certified Exercise Expert for the Aging Adult  08/31/20 3:09 PM Phone: [phone] Fax: [place] [facility] [address] [place], [place], [address] Phone: [phone]   Fax:  [phone]  Name: [name] MRN: [id number] Date of Birth: [birth year]  PHYSICAL THERAPY DISCHARGE SUMMARY  Visits from Start of Care: 3  Current functional level related to goals / functional outcomes: As above and from Eval   Remaining deficits: Continued LBP  managed by attending aquatics and [employer] / Equipment: HEP   Patient agrees to discharge. Patient goals were not met. Patient is being discharged due to the patient's request.   Pt was called about scheduled aquatics appt at [facility].  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, [place], [facility] Certified Exercise Expert for the Aging Adult  10/19/20 1:43 PM Phone: [phone] Fax: [phone]

## 2020-09-02 IMAGING — CT CT ABDOMEN AND PELVIS WITH CONTRAST
2 of 4 series · 17 of 46 positions shown, 19 images · IV contrast (omnipaque)
Comparison: 06/11/2009

CLINICAL DATA: Abdominal pain, diverticulitis suspected, positive
[id number]

EXAM:
CT ABDOMEN AND PELVIS WITH CONTRAST
TECHNIQUE: Multidetector CT imaging of the abdomen and pelvis was performed
using the standard protocol following bolus administration of
intravenous contrast.
CONTRAST:  100mL OMNIPAQUE IOHEXOL 300 MG/ML  SOLN

[Series 2: axial st · axial · 0.89mm/px · z∈[+1231,+1656]mm · 14 of 99 slices shown, 16 images]
[im 7/99  soft-tissue]
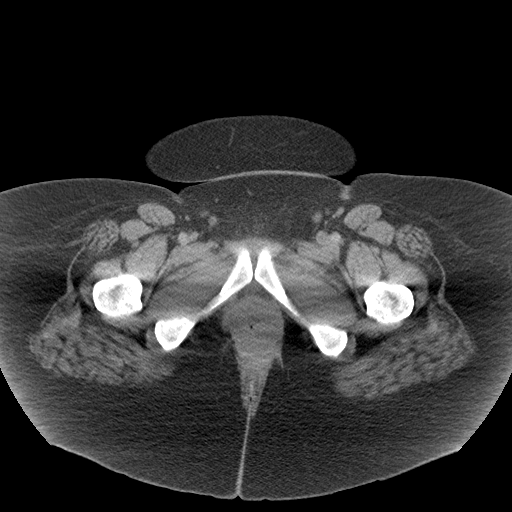
[im 7/99  bone]
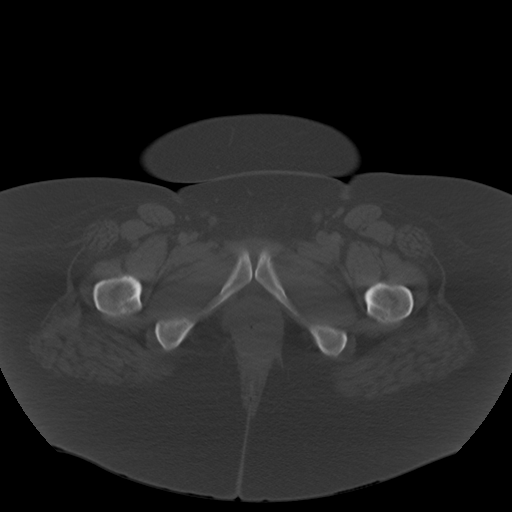
[im 13/99  soft-tissue]
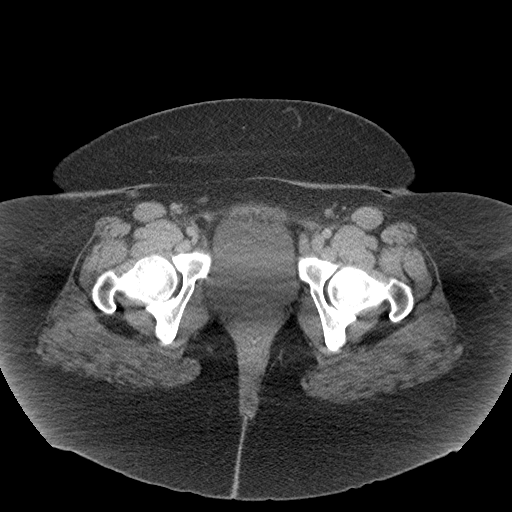
[im 19/99  soft-tissue]
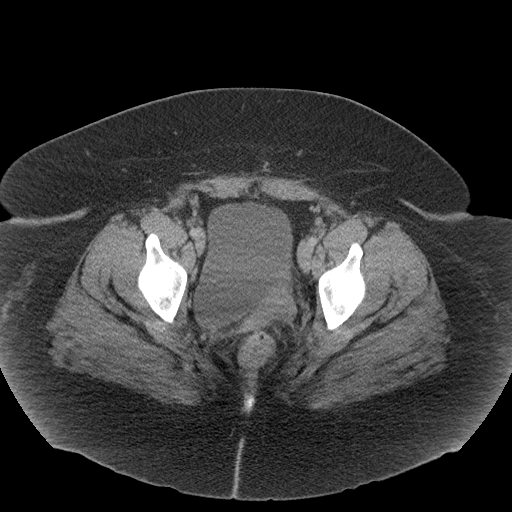
[im 25/99  soft-tissue]
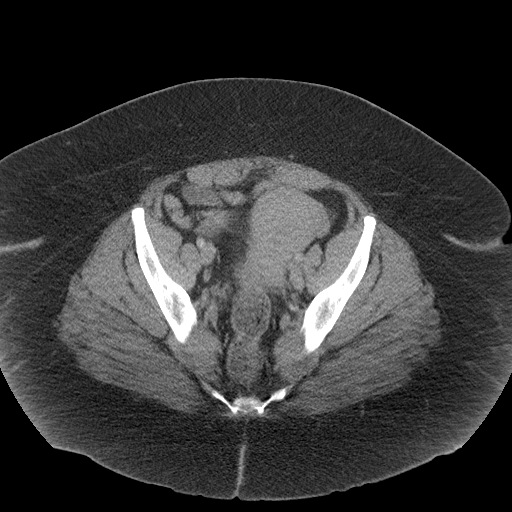
[im 31/99  soft-tissue]
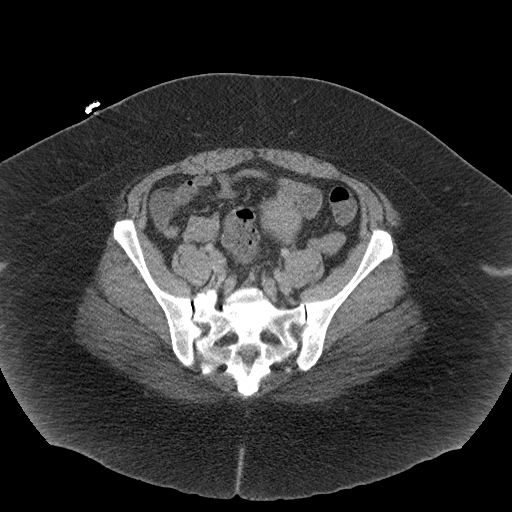
[im 37/99  soft-tissue]
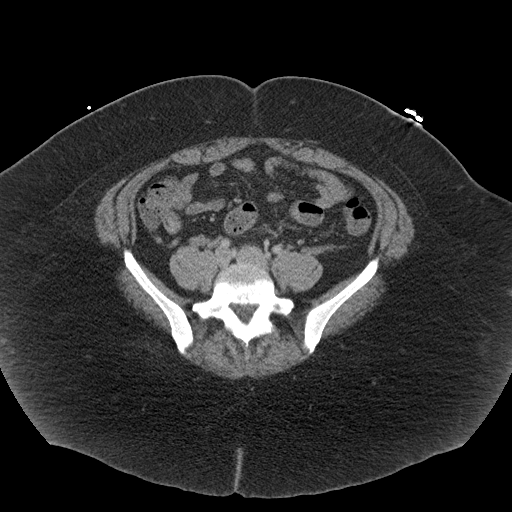
[im 43/99  soft-tissue]
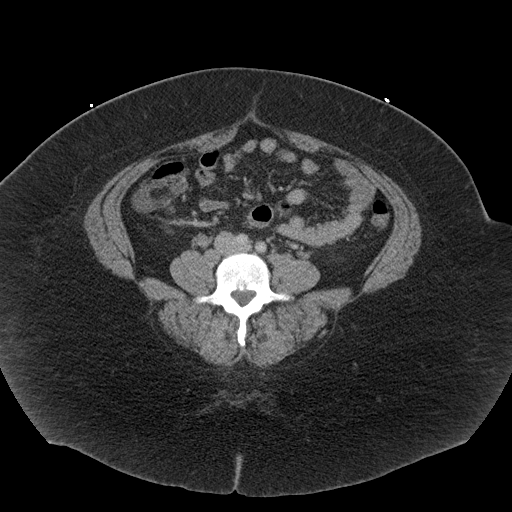
[im 56/99  soft-tissue]
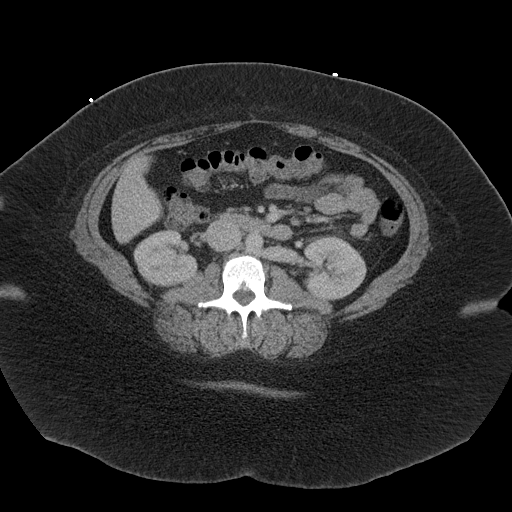
[im 62/99  soft-tissue]
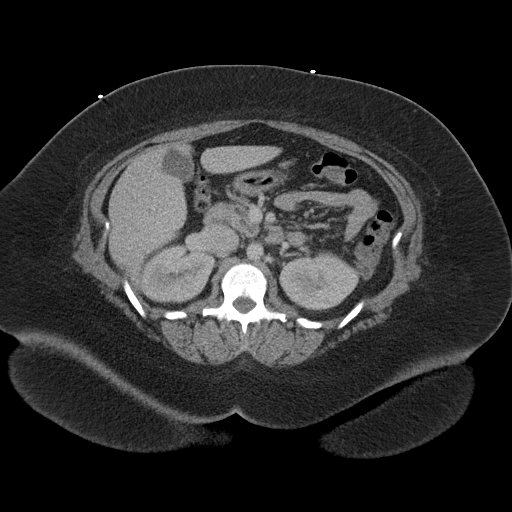
[im 62/99  bone]
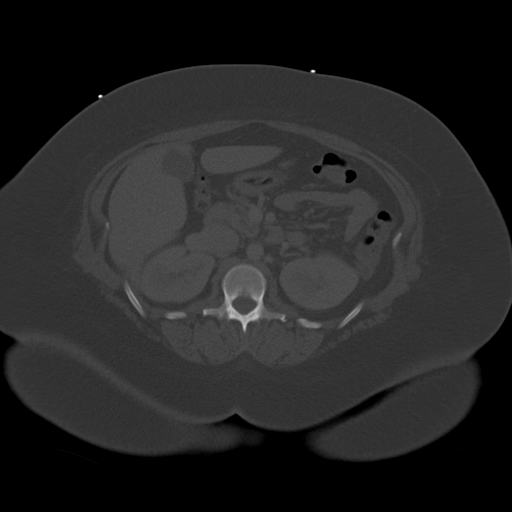
[im 68/99  soft-tissue]
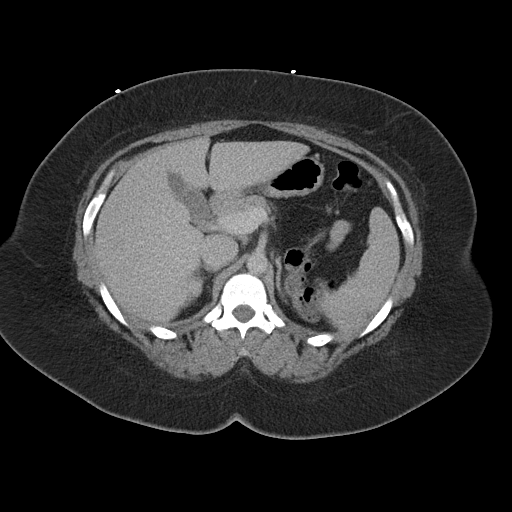
[im 74/99  soft-tissue]
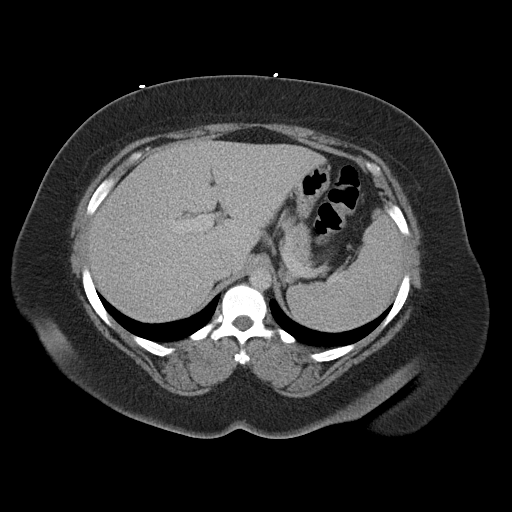
[im 80/99  soft-tissue]
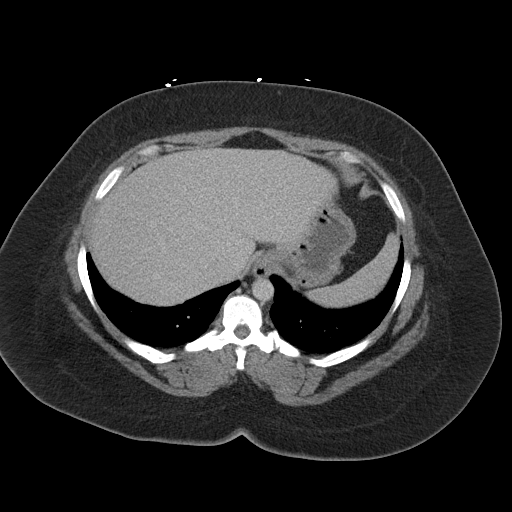
[im 86/99  soft-tissue]
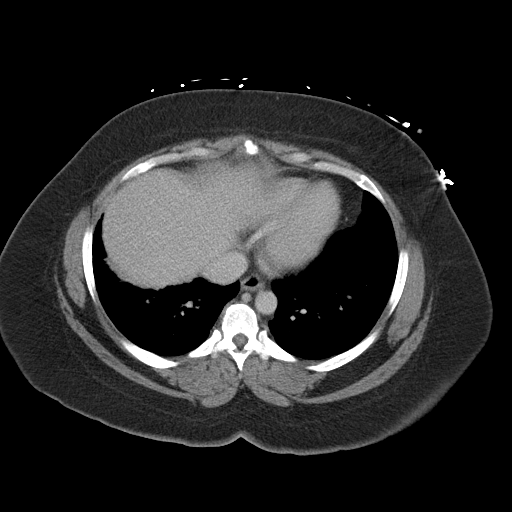
[im 92/99  soft-tissue]
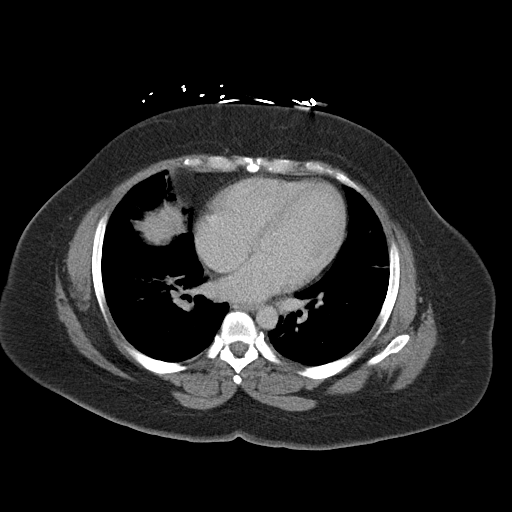

[Series 5: coronal st · coronal · 0.97mm/px · 3 of 151 slices shown]
[im 51/151  soft-tissue]
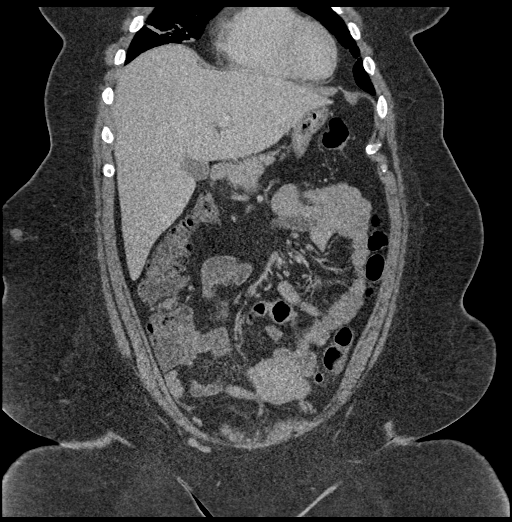
[im 67/151  soft-tissue]
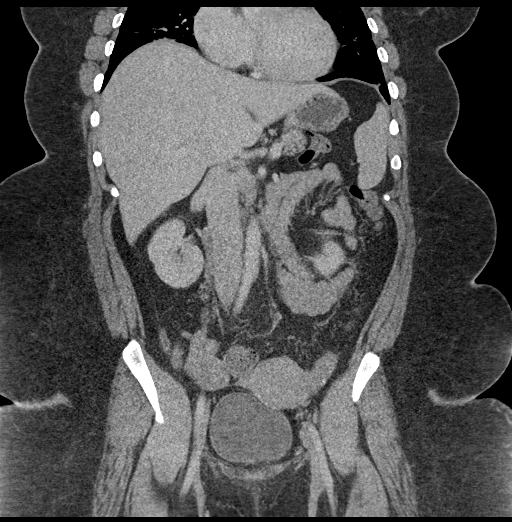
[im 84/151  soft-tissue]
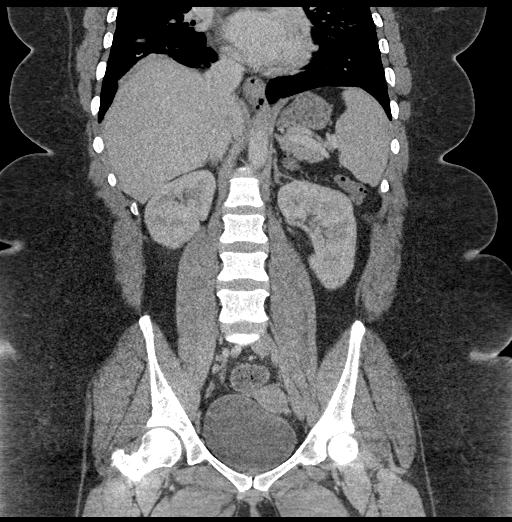

[17 of 46 positions shown; findings below may reference images not displayed]

FINDINGS: Lower chest: Extensive irregular, consolidative, and ground-glass
opacity of the included bilateral lung bases. Small, benign
pulmonary nodules of the left lung base unchanged from prior
examination dated 9399.

Hepatobiliary: No solid liver abnormality is seen. No gallstones,
gallbladder wall thickening, or biliary dilatation.

Pancreas: Unremarkable. No pancreatic ductal dilatation or
surrounding inflammatory changes.

Spleen: Normal in size without significant abnormality.

Adrenals/Urinary Tract: Adrenal glands are unremarkable. Kidneys are
normal, without renal calculi, solid lesion, or hydronephrosis.
Bladder is unremarkable.

Stomach/Bowel: Stomach is within normal limits. Appendix appears
normal. No evidence of bowel wall thickening, distention, or
inflammatory changes.

Vascular/Lymphatic: No significant vascular findings are present. No
enlarged abdominal or pelvic lymph nodes.

Reproductive: No mass or other significant abnormality.

Other: No abdominal wall hernia or abnormality. No abdominopelvic
ascites.

Musculoskeletal: No acute or significant osseous findings.
IMPRESSION: 1. No acute CT findings of the abdomen or pelvis to explain
abdominal pain. No evidence of significant diverticular disease or
diverticulitis.

2. Extensive irregular, consolidative, and ground-glass opacity of
the included bilateral lung bases, in keeping with [id number]
diagnosis.

## 2020-09-14 ENCOUNTER — Ambulatory Visit: Payer: Medicaid Other | Admitting: Physical Therapy

## 2020-09-27 ENCOUNTER — Ambulatory Visit: Payer: Medicaid Other | Admitting: Physical Therapy

## 2020-09-28 ENCOUNTER — Telehealth: Payer: Self-pay | Admitting: Physical Therapy

## 2020-09-28 NOTE — Telephone Encounter (Signed)
Attempted to contact patient due to missed PT appointment on 09/27/2020. Left VM informing patient of missed appointment and next scheduled appointment on 10/19/20, which is an aquatic therapy appointment, that will have to be cancelled if she does not have a land appointment first. Patient instructed to contact the clinic to schedule a land based appointment for re-eval prior to her scheduled aquatic therapy appointment or else she would not be able to be seen on 10/19/20. Patient advised of attendance policy.  Rosana Hoes, PT, DPT, LAT, ATC 09/28/20  9:00 AM Phone: [phone] Fax: [phone]

## 2020-10-19 ENCOUNTER — Ambulatory Visit: Payer: Medicaid Other | Admitting: Physical Therapy

## 2020-12-15 ENCOUNTER — Telehealth: Payer: Self-pay | Admitting: Medical-Surgical

## 2020-12-15 NOTE — Telephone Encounter (Signed)
Pt called. She is following up on a medical form sent by fax yesterday. Both Christal and Amber were unable to locate fax. I asked patient to re-fax.  Thank you

## 2021-02-11 ENCOUNTER — Emergency Department (INDEPENDENT_AMBULATORY_CARE_PROVIDER_SITE_OTHER): Payer: Medicaid Other

## 2021-02-11 ENCOUNTER — Encounter: Payer: Self-pay | Admitting: Emergency Medicine

## 2021-02-11 ENCOUNTER — Emergency Department
Admission: EM | Admit: 2021-02-11 | Discharge: 2021-02-11 | Disposition: A | Discharge status: Home / Self Care | Payer: Medicaid Other | Source: Home / Self Care

## 2021-02-11 DIAGNOSIS — R0602 Shortness of breath: Secondary | ICD-10-CM

## 2021-02-11 DIAGNOSIS — M79644 Pain in right finger(s): Secondary | ICD-10-CM | POA: Diagnosis not present

## 2021-02-11 DIAGNOSIS — W19XXXA Unspecified fall, initial encounter: Secondary | ICD-10-CM

## 2021-02-11 DIAGNOSIS — R059 Cough, unspecified: Secondary | ICD-10-CM

## 2021-02-11 DIAGNOSIS — M7989 Other specified soft tissue disorders: Secondary | ICD-10-CM

## 2021-02-11 DIAGNOSIS — M79641 Pain in right hand: Secondary | ICD-10-CM

## 2021-02-11 DIAGNOSIS — J101 Influenza due to other identified influenza virus with other respiratory manifestations: Secondary | ICD-10-CM

## 2021-02-11 DIAGNOSIS — R079 Chest pain, unspecified: Secondary | ICD-10-CM | POA: Diagnosis not present

## 2021-02-11 LAB — POCT INFLUENZA A/B
Influenza A, POC: POSITIVE — AB
Influenza B, POC: NEGATIVE

## 2021-02-11 MED ORDER — OSELTAMIVIR PHOSPHATE 75 MG PO CAPS: 75.0000 mg | ORAL_CAPSULE | Freq: Two times a day (BID) | ORAL | 0 refills | Status: DC

## 2021-02-11 MED ORDER — ACETAMINOPHEN 325 MG PO TABS
650.0000 mg | ORAL_TABLET | ORAL | Status: AC
  Administered 2021-02-11: 650 mg via ORAL

## 2021-02-11 MED ORDER — BENZONATATE 200 MG PO CAPS: 200.0000 mg | ORAL_CAPSULE | Freq: Three times a day (TID) | ORAL | 0 refills | Status: AC | PRN

## 2021-02-11 NOTE — Discharge Instructions (Addendum)
Advised/instructed patient to take medication as directed with food to completion.  Advised patient may take Tessalon Perles daily or as needed for cough.  Encouraged patient to increase daily water intake while taking these medications. 

## 2021-02-11 NOTE — ED Provider Notes (Signed)
[name]    CSN: [id number] Arrival date & time: 02/11/21  1508      History   Chief Complaint Chief Complaint  Patient presents with   Cough    HPI [name] is a 40 y.o. female.   HPI 40 year old woman woke up with cough and congestion this morning reports having COVID-pneumonia 2 years ago was hospitalized.  Patient reveals similar symptoms.  And is concerned with pneumonia.  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  Additionally, patient reports right index finger pain for 2 to 3 days secondary to fall.  Past Medical History:  Diagnosis Date   [redacted] weeks gestation of pregnancy    Advanced maternal age in multigravida 01/17/2015   Arthritis    "back; right foot; left ankle" (07/22/2014)   Cellulitis of leg, right 07/2014   Chronic lower back pain    History of COVID-19 06/21/2020   Morbid obesity with BMI of 50.0-59.9, adult (HCC)    Prediabetes    S/P cesarean section 07/20/2015   Vaginal delivery 1998    Patient Active Problem List   Diagnosis Date Noted   Chronic bilateral low back pain with right-sided sciatica 06/21/2020   Gastroenteritis 06/16/2015   Anemia 07/24/2014   Morbid obesity with body mass index of 60.0-69.9 in adult [facility]) 07/22/2014   Cellulitis of right lower extremity 07/22/2014   Prediabetes 07/22/2014   Fissure in skin of foot 07/22/2014   GERD without esophagitis 08/28/2012    Past Surgical History:  Procedure Laterality Date   CESAREAN SECTION  1994; 2010   CESAREAN SECTION N/A 07/20/2015   Procedure: REPEAT CESAREAN SECTION;  Surgeon: Brock Bad, MD;  Location: [facility];  Service: Obstetrics;  Laterality: N/A;   DILATION AND CURETTAGE OF UTERUS  ~ 2008   TUBAL LIGATION      OB History     Gravida  5   Para  4   Term  4   Preterm      AB  1   Living  4      SAB  1   IAB      Ectopic      Multiple  0   Live Births  4            Home Medications    Prior to  Admission medications   Medication Sig Start Date End Date Taking? Authorizing Provider  benzonatate (TESSALON) 200 MG capsule Take 1 capsule (200 mg total) by mouth 3 (three) times daily as needed for up to 7 days for cough. 02/11/21 02/18/21 Yes Trevor Iha, FNP  oseltamivir (TAMIFLU) 75 MG capsule Take 1 capsule (75 mg total) by mouth every 12 (twelve) hours. 02/11/21  Yes Trevor Iha, FNP  celecoxib (CELEBREX) 200 MG capsule Take 1 capsule (200 mg total) by mouth 2 (two) times daily as needed. Patient not taking: Reported on 07/13/2020 06/21/20   Christen Butter, NP  ferrous sulfate 325 (65 FE) MG EC tablet Take 1 tablet (325 mg total) by mouth daily with breakfast. Patient not taking: Reported on 07/13/2020 06/27/20   Christen Butter, NP  phentermine (ADIPEX-P) 37.5 MG tablet One half (1/2) tablet by mouth every morning. Patient not taking: Reported on 02/11/2021 06/24/20   Christen Butter, NP  predniSONE (DELTASONE) 50 MG tablet Take 1 tablet (50 mg total) by mouth daily. Patient not taking: Reported on 07/13/2020 06/21/20   Christen Butter, NP    Family  History Family History  Problem Relation Age of Onset   Hypertension Mother    Diabetes Father    Prostate cancer Father    Diabetes Maternal Grandfather    Heart attack Other     Social History Social History   Tobacco Use   Smoking status: Never   Smokeless tobacco: Never  Vaping Use   Vaping Use: Never used  Substance Use Topics   Alcohol use: Not Currently    Alcohol/week: 0.0 standard drinks   Drug use: No     Allergies   Penicillin g, Penicillins, and Tramadol hcl   Review of Systems Review of Systems  Respiratory:  Positive for cough.   Musculoskeletal:        Right index finger pain x3 days  All other systems reviewed and are negative.   Physical Exam Triage Vital Signs ED Triage Vitals  Enc Vitals Group     BP 02/11/21 1531 112/77     Pulse Rate 02/11/21 1531 86     Resp 02/11/21 1531 (!) 22     Temp 02/11/21  1531 99.4 F (37.4 C)     Temp Source 02/11/21 1531 Oral     SpO2 02/11/21 1531 100 %     Weight 02/11/21 1537 290 lb (131.5 kg)     Height 02/11/21 1537 5' 3.5" (1.613 m)     Head Circumference --      Peak Flow --      Pain Score 02/11/21 1536 8     Pain Loc --      Pain Edu? --      Excl. in GC? --    No data found.  Updated Vital Signs BP 112/77 (BP Location: Left Arm)   Pulse 86   Temp 99.4 F (37.4 C) (Oral)   Resp (!) 22   Ht 5' 3.5" (1.613 m)   Wt 290 lb (131.5 kg)   LMP 01/13/2021 (Exact Date)   SpO2 100%   BMI 50.57 kg/m    Physical Exam Vitals and nursing note reviewed.  Constitutional:      General: She is not in acute distress.    Appearance: Normal appearance. She is obese. She is not ill-appearing.  HENT:     Head: Normocephalic and atraumatic.     Right Ear: Tympanic membrane, ear canal and external ear normal.     Left Ear: Tympanic membrane, ear canal and external ear normal.     Mouth/Throat:     Mouth: Mucous membranes are moist.     Pharynx: Oropharynx is clear.  Eyes:     Extraocular Movements: Extraocular movements intact.     Conjunctiva/sclera: Conjunctivae normal.     Pupils: Pupils are equal, round, and reactive to light.  Cardiovascular:     Rate and Rhythm: Normal rate and regular rhythm.     Pulses: Normal pulses.     Heart sounds: Normal heart sounds.  Pulmonary:     Effort: Pulmonary effort is normal.     Breath sounds: Normal breath sounds. No wheezing, rhonchi or rales.  Musculoskeletal:        General: Normal range of motion.     Cervical back: Normal range of motion and neck supple.     Comments: Right index finger: Mildly TTP over dorsum, full range of motion with flexion extension.  Skin:    General: Skin is warm and dry.  Neurological:     General: No focal deficit present.     Mental Status:  She is alert and oriented to person, place, and time.     UC Treatments / Results  Labs (all labs ordered are listed, but  only abnormal results are displayed) Labs Reviewed  POCT INFLUENZA A/B - Abnormal; Notable for the following components:      Result Value   Influenza A, POC Positive (*)    All other components within normal limits  COVID-19, FLU A+B AND RSV    EKG   Radiology DG Chest 2 View  Result Date: 02/11/2021 CLINICAL DATA:  Cough. Shortness of breath and chest pain. Fall 2 days ago. EXAM: CHEST - 2 VIEW COMPARISON:  03/29/2020 FINDINGS: Central bronchial thickening.The cardiomediastinal contours are normal. Pulmonary vasculature is normal. No consolidation, pleural effusion, or pneumothorax. No acute osseous abnormalities are seen. IMPRESSION: Central bronchial thickening without pneumonia. Electronically Signed   By: Narda Rutherford M.D.   On: 02/11/2021 16:25   DG Hand Complete Right  Result Date: 02/11/2021 CLINICAL DATA:  Right index finger pain. Fall 2 days ago. Swelling. EXAM: RIGHT HAND - COMPLETE 3+ VIEW COMPARISON:  None. FINDINGS: There is no evidence of fracture or dislocation. Normal joint spaces. Normal alignment. There is no evidence of arthropathy or other focal bone abnormality. Soft tissues are unremarkable. IMPRESSION: No fracture or dislocation of the right hand, with special attention to the index finger. Electronically Signed   By: Narda Rutherford M.D.   On: 02/11/2021 16:24    Procedures Procedures (including critical care time)  Medications Ordered in UC Medications  acetaminophen (TYLENOL) tablet 650 mg (650 mg Oral Given 02/11/21 1625)    Initial Impression / Assessment and Plan / UC Course  I have reviewed the triage vital signs and the nursing notes.  Pertinent labs & imaging results that were available during my care of the patient were reviewed by me and considered in my medical decision making (see chart for details).    MDM: 1.  Cough-CXR revealed (above), 2.  Right hand pain-right hand/right index finger-negative; 3.  Influenza A-Rx'd Tamiflu and  Tessalon Perles. Advised/instructed patient to take medication as directed with food to completion.  Advised patient may take Tessalon Perles daily or as needed for cough.  Encouraged patient to increase daily water intake while taking these medications.  Patient discharged home, hemodynamically stable. Final Clinical Impressions(s) / UC Diagnoses   Final diagnoses:  Cough, unspecified type  Right hand pain  Influenza A     Discharge Instructions      Advised/instructed patient to take medication as directed with food to completion.  Advised patient may take Tessalon Perles daily or as needed for cough.  Encouraged patient to increase daily water intake while taking these medications.     ED Prescriptions     Medication Sig Dispense Auth. Provider   oseltamivir (TAMIFLU) 75 MG capsule Take 1 capsule (75 mg total) by mouth every 12 (twelve) hours. 10 capsule Trevor Iha, FNP   benzonatate (TESSALON) 200 MG capsule Take 1 capsule (200 mg total) by mouth 3 (three) times daily as needed for up to 7 days for cough. 40 capsule Trevor Iha, FNP      PDMP not reviewed this encounter.   Trevor Iha, FNP 02/11/21 1637

## 2021-02-11 NOTE — ED Notes (Signed)
Pt asked RN at discharge about a steroid shot before going home. RN confirmed w/ Trevor Iha, FNP that a steroid shot was not ordered at this time. 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 Patient requested a work note. See excuses section- one sent to My Chart & pt also given a printed copy. Pt remains concerned about pain to r index finger. Copies of Xray results provided to patient by M. Ave Filter, FNP. Patient asked to speak to provider at 1640 about pain to right index finger. RN had explained to patient that xray showed no evidence of an infection or fracture at this time. If there was a concern for ligament or tendon damage, pt was given sports medicine contact information for Dr T. Patient asked why the doctor was not still here & what time did we close.  RN explained that the provider had left when RN went in to discharge her & [facility] closes at 4pm  on the weekend. No other questions at this time. Pt given a box of tissues prior to leaving.

## 2021-02-11 NOTE — ED Triage Notes (Addendum)
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

## 2021-02-12 LAB — COVID-19, FLU A+B AND RSV
Influenza A, NAA: DETECTED — AB
Influenza B, NAA: NOT DETECTED
RSV, NAA: NOT DETECTED
SARS-CoV-2, NAA: NOT DETECTED

## 2021-05-04 DIAGNOSIS — N390 Urinary tract infection, site not specified: Secondary | ICD-10-CM | POA: Diagnosis not present

## 2021-05-04 DIAGNOSIS — R310 Gross hematuria: Secondary | ICD-10-CM | POA: Diagnosis not present

## 2021-05-04 DIAGNOSIS — Z79899 Other long term (current) drug therapy: Secondary | ICD-10-CM | POA: Diagnosis not present

## 2021-05-04 DIAGNOSIS — R300 Dysuria: Secondary | ICD-10-CM | POA: Diagnosis not present

## 2021-05-05 DIAGNOSIS — R310 Gross hematuria: Secondary | ICD-10-CM | POA: Diagnosis not present

## 2021-05-06 DIAGNOSIS — N390 Urinary tract infection, site not specified: Secondary | ICD-10-CM | POA: Diagnosis not present

## 2021-05-06 DIAGNOSIS — R300 Dysuria: Secondary | ICD-10-CM | POA: Diagnosis not present

## 2021-05-06 DIAGNOSIS — Z7251 High risk heterosexual behavior: Secondary | ICD-10-CM | POA: Diagnosis not present

## 2021-05-06 DIAGNOSIS — R310 Gross hematuria: Secondary | ICD-10-CM | POA: Diagnosis not present

## 2021-05-06 DIAGNOSIS — N182 Chronic kidney disease, stage 2 (mild): Secondary | ICD-10-CM | POA: Diagnosis not present

## 2021-05-12 DIAGNOSIS — N390 Urinary tract infection, site not specified: Secondary | ICD-10-CM | POA: Diagnosis not present

## 2021-05-12 DIAGNOSIS — N182 Chronic kidney disease, stage 2 (mild): Secondary | ICD-10-CM | POA: Diagnosis not present

## 2021-05-12 DIAGNOSIS — N200 Calculus of kidney: Secondary | ICD-10-CM | POA: Diagnosis not present

## 2021-05-12 DIAGNOSIS — R310 Gross hematuria: Secondary | ICD-10-CM | POA: Diagnosis not present

## 2021-05-23 DIAGNOSIS — N200 Calculus of kidney: Secondary | ICD-10-CM | POA: Diagnosis not present

## 2021-05-23 DIAGNOSIS — R310 Gross hematuria: Secondary | ICD-10-CM | POA: Diagnosis not present

## 2021-05-23 DIAGNOSIS — Z9189 Other specified personal risk factors, not elsewhere classified: Secondary | ICD-10-CM | POA: Diagnosis not present

## 2021-05-23 DIAGNOSIS — N182 Chronic kidney disease, stage 2 (mild): Secondary | ICD-10-CM | POA: Diagnosis not present

## 2021-07-10 DIAGNOSIS — J029 Acute pharyngitis, unspecified: Secondary | ICD-10-CM | POA: Diagnosis not present

## 2021-07-10 DIAGNOSIS — Z20822 Contact with and (suspected) exposure to covid-19: Secondary | ICD-10-CM | POA: Diagnosis not present

## 2021-07-11 DIAGNOSIS — J029 Acute pharyngitis, unspecified: Secondary | ICD-10-CM | POA: Diagnosis not present

## 2021-07-11 DIAGNOSIS — Z880 Allergy status to penicillin: Secondary | ICD-10-CM | POA: Diagnosis not present

## 2021-07-11 DIAGNOSIS — Z885 Allergy status to narcotic agent status: Secondary | ICD-10-CM | POA: Diagnosis not present

## 2021-07-11 DIAGNOSIS — R519 Headache, unspecified: Secondary | ICD-10-CM | POA: Diagnosis not present

## 2021-07-11 DIAGNOSIS — Z20822 Contact with and (suspected) exposure to covid-19: Secondary | ICD-10-CM | POA: Diagnosis not present

## 2021-07-11 DIAGNOSIS — J069 Acute upper respiratory infection, unspecified: Secondary | ICD-10-CM | POA: Diagnosis not present

## 2021-07-12 ENCOUNTER — Telehealth: Payer: Self-pay

## 2021-07-12 NOTE — Telephone Encounter (Signed)
Transition Care Management Follow-up Telephone Call ?Date of discharge and from where: 07/11/2021-[facility]  ?How have you been since you were released from the hospital? Pt stated she is doing fine but felt like she was rushed out of the [place] ED. Patient was encouraged to follow up with her PCP for further assistance.  ?Any questions or concerns? No ? ?Items Reviewed: ?Did the pt receive and understand the discharge instructions provided? Yes  ?Medications obtained and verified?  No medications given at discharge ?Other? No  ?Any new allergies since your discharge? No  ?Dietary orders reviewed? No ?Do you have support at home? Yes  ? ?Home Care and Equipment/Supplies: ?Were home health services ordered? not applicable ?If so, what is the name of the agency? N/A  ?Has the agency set up a time to come to the patient's home? not applicable ?Were any new equipment or medical supplies ordered?  No ?What is the name of the medical supply agency? N/A ?Were you able to get the supplies/equipment? not applicable ?Do you have any questions related to the use of the equipment or supplies? No ? ?Functional Questionnaire: (I = Independent and D = Dependent) ?ADLs: I ? ?Bathing/Dressing- I ? ?Meal Prep- I ? ?Eating- I ? ?Maintaining continence- I ? ?Transferring/Ambulation- I ? ?Managing Meds- I ? ?Follow up appointments reviewed: ? ?PCP Hospital f/u appt confirmed? No  confirmed? No   ?Are transportation arrangements needed? No  ?If their condition worsens, is the pt aware to call PCP or go to the Emergency Dept.? Yes ?Was the patient provided with contact information for the PCP's office or ED? Yes ?Was to pt encouraged to call back with questions or concerns? Yes  ?

## 2021-07-17 ENCOUNTER — Telehealth: Payer: Self-pay | Admitting: *Deleted

## 2021-07-17 NOTE — Telephone Encounter (Signed)
Returned call from 2:45 PM. Left patient a message to call and schedule New GYN appointment for annual and pap per patient request. ?

## 2021-09-07 ENCOUNTER — Other Ambulatory Visit: Payer: Self-pay | Admitting: Obstetrics

## 2021-09-07 ENCOUNTER — Encounter: Payer: Self-pay | Admitting: Obstetrics

## 2021-09-07 ENCOUNTER — Other Ambulatory Visit (HOSPITAL_COMMUNITY)
Admission: RE | Admit: 2021-09-07 | Discharge: 2021-09-07 | Disposition: A | Discharge status: Home / Self Care | Payer: Medicaid Other | Source: Ambulatory Visit | Attending: Obstetrics | Admitting: Obstetrics

## 2021-09-07 ENCOUNTER — Ambulatory Visit (INDEPENDENT_AMBULATORY_CARE_PROVIDER_SITE_OTHER): Payer: Medicaid Other | Admitting: Obstetrics

## 2021-09-07 VITALS — BP 135/83 | HR 84 | Ht 63.0 in | Wt 361.5 lb

## 2021-09-07 DIAGNOSIS — N898 Other specified noninflammatory disorders of vagina: Secondary | ICD-10-CM | POA: Insufficient documentation

## 2021-09-07 DIAGNOSIS — N946 Dysmenorrhea, unspecified: Secondary | ICD-10-CM | POA: Diagnosis not present

## 2021-09-07 DIAGNOSIS — Z1239 Encounter for other screening for malignant neoplasm of breast: Secondary | ICD-10-CM

## 2021-09-07 DIAGNOSIS — Z113 Encounter for screening for infections with a predominantly sexual mode of transmission: Secondary | ICD-10-CM

## 2021-09-07 DIAGNOSIS — Z01419 Encounter for gynecological examination (general) (routine) without abnormal findings: Secondary | ICD-10-CM | POA: Diagnosis not present

## 2021-09-07 DIAGNOSIS — D508 Other iron deficiency anemias: Secondary | ICD-10-CM

## 2021-09-07 MED ORDER — IBUPROFEN 800 MG PO TABS: 800.0000 mg | ORAL_TABLET | Freq: Three times a day (TID) | ORAL | 5 refills | Status: DC | PRN

## 2021-09-07 MED ORDER — FERROUS SULFATE 325 (65 FE) MG PO TABS: 650.0000 mg | ORAL_TABLET | ORAL | 5 refills | Status: DC

## 2021-09-07 NOTE — Progress Notes (Signed)
Pt is in the office for annual Last pap 08/28/2021 Last pap 12/16/2014 Pt declined GAD-7 and PhQ9

## 2021-09-07 NOTE — Progress Notes (Signed)
Subjective:        [name] is a 41 y.o. female here for a routine exam.  Current complaints: Vaginal discharge.  Painful periods..    Personal health questionnaire:  Is patient Ashkenazi Jewish, have a family history of breast and/or ovarian cancer: no Is there a family history of uterine cancer diagnosed at age < 9050, gastrointestinal cancer, urinary tract cancer, family member who is a Personnel officerLynch syndrome-associated carrier: no Is the patient overweight and hypertensive, family history of diabetes, personal history of gestational diabetes, preeclampsia or PCOS: no Is patient over 2555, have PCOS,  family history of premature CHD under age 10065, diabetes, smoke, have hypertension or peripheral artery disease:  no At any time, has a partner hit, kicked or otherwise hurt or frightened you?: no Over the past 2 weeks, have you felt down, depressed or hopeless?: no Over the past 2 weeks, have you felt little interest or pleasure in doing things?:no   Gynecologic History Patient's last menstrual period was 08/28/2021. Contraception: tubal ligation Last Pap: 2016. Results were: normal Last mammogram: none. Results were: none  Obstetric History OB History  Gravida Para Term Preterm AB Living  5 4 4   1 4   SAB IAB Ectopic Multiple Live Births  1     0 4    # Outcome Date GA Lbr Len/2nd Weight Sex Delivery Anes PTL Lv  5 Term 07/20/15 [id number]  8 lb 8.5 oz (3.87 kg) M CS-LTranv Spinal  LIV  4 Term 12/24/08    M CS-LTranv   LIV  3 SAB 04/02/06          2 Term 02/11/97    F Vag-Spont   LIV  1 Term 12/29/92    Wandalee FerdinandM CS-LTranv   LIV    Past Medical History:  Diagnosis Date   [redacted] weeks gestation of pregnancy    Advanced maternal age in multigravida 01/17/2015   Arthritis    "back; right foot; left ankle" (07/22/2014)   Cellulitis of leg, right 07/2014   Chronic lower back pain    History of COVID-19 06/21/2020   Morbid obesity with BMI of 50.0-59.9, adult (HCC)    Prediabetes    S/P cesarean  section 07/20/2015   Vaginal delivery 1998    Past Surgical History:  Procedure Laterality Date   CESAREAN SECTION  1994; 2010   CESAREAN SECTION N/A 07/20/2015   Procedure: REPEAT CESAREAN SECTION;  Surgeon: Brock Badharles A Sarkis Rhines, MD;  Location: [facility];  Service: Obstetrics;  Laterality: N/A;   DILATION AND CURETTAGE OF UTERUS  ~ 2008   TUBAL LIGATION       Current Outpatient Medications:    ferrous sulfate 325 (65 FE) MG tablet, Take 2 tablets (650 mg total) by mouth every other day., Disp: 60 tablet, Rfl: 5   ibuprofen (ADVIL) 800 MG tablet, Take 1 tablet (800 mg total) by mouth every 8 (eight) hours as needed., Disp: 30 tablet, Rfl: 5   ferrous sulfate 325 (65 FE) MG EC tablet, Take 1 tablet (325 mg total) by mouth daily with breakfast. (Patient not taking: Reported on 07/13/2020), Disp: 90 tablet, Rfl: 3   oseltamivir (TAMIFLU) 75 MG capsule, Take 1 capsule (75 mg total) by mouth every 12 (twelve) hours. (Patient not taking: Reported on 09/07/2021), Disp: 10 capsule, Rfl: 0   phentermine (ADIPEX-P) 37.5 MG tablet, One half (1/2) tablet by mouth every morning. (Patient not taking: Reported on 02/11/2021), Disp: 30 tablet, Rfl: 0   predniSONE (  DELTASONE) 50 MG tablet, Take 1 tablet (50 mg total) by mouth daily. (Patient not taking: Reported on 07/13/2020), Disp: 5 tablet, Rfl: 0 Allergies  Allergen Reactions   Penicillin G Rash   Penicillins Hives   Tramadol Hcl Other (See Comments)    hallucinations    Social History   Tobacco Use   Smoking status: Never   Smokeless tobacco: Never  Substance Use Topics   Alcohol use: Not Currently    Alcohol/week: 0.0 standard drinks of alcohol    Family History  Problem Relation Age of Onset   Hypertension Mother    Diabetes Father    Prostate cancer Father    Diabetes Maternal Grandfather    Heart attack Other       Review of Systems  Constitutional: negative for fatigue and weight loss Respiratory: negative for cough and  wheezing Cardiovascular: negative for chest pain, fatigue and palpitations Gastrointestinal: negative for abdominal pain and change in bowel habits Musculoskeletal:negative for myalgias Neurological: negative for gait problems and tremors Behavioral/Psych: negative for abusive relationship, depression Endocrine: negative for temperature intolerance    Genitourinary:negative for abnormal menstrual periods, genital lesions, hot flashes, sexual problems and vaginal discharge Integument/breast: negative for breast lump, breast tenderness, nipple discharge and skin lesion(s)    Objective:       BP 135/83   Pulse 84   Ht 5\' 3"  (1.6 m)   Wt (!) 361 lb 8 oz (164 kg)   LMP 08/28/2021   BMI 64.04 kg/m  General:   alert  Skin:   no rash or abnormalities  Lungs:   clear to auscultation bilaterally  Heart:   regular rate and rhythm, S1, S2 normal, no murmur, click, rub or gallop  Breasts:   normal without suspicious masses, skin or nipple changes or axillary nodes  Abdomen:  normal findings: no organomegaly, soft, non-tender and no hernia  Pelvis:  External genitalia: normal general appearance Urinary system: urethral meatus normal and bladder without fullness, nontender Vaginal: normal without tenderness, induration or masses Cervix: normal appearance Adnexa: normal bimanual exam Uterus: anteverted and non-tender, normal size   Lab Review Urine pregnancy test Labs reviewed yes Radiologic studies reviewed no  I have spent a total of 20 minutes of face-to-face time, excluding clinical staff time, reviewing notes and preparing to see patient, ordering tests and/or medications, and counseling the patient.   Assessment:     1. Encounter for gynecological examination with Papanicolaou smear of cervix Rx: - Cytology - PAP( [place])  2. Dysmenorrhea Rx: - ibuprofen (ADVIL) 800 MG tablet; Take 1 tablet (800 mg total) by mouth every 8 (eight) hours as needed.  Dispense: 30 tablet;  Refill: 5  3. Vaginal discharge Rx: - Cervicovaginal ancillary only( [place])  4. Screening for STD (sexually transmitted disease) Rx: - HIV antibody (with reflex) - RPR - Hepatitis B Surface AntiGEN - Hepatitis C Antibody  5. Screening breast examination - mammogram scheduled  6. Iron deficiency anemia secondary to inadequate dietary iron intake Rx: - ferrous sulfate 325 (65 FE) MG tablet; Take 2 tablets (650 mg total) by mouth every other day.  Dispense: 60 tablet; Refill: 5    Plan:    Education reviewed: calcium supplements, depression evaluation, low fat, low cholesterol diet, safe sex/STD prevention, self breast exams, and weight bearing exercise. Mammogram ordered. Follow up in: 1 year.   Meds ordered this encounter  Medications   ibuprofen (ADVIL) 800 MG tablet    Sig: Take 1 tablet (800  mg total) by mouth every 8 (eight) hours as needed.    Dispense:  30 tablet    Refill:  5   ferrous sulfate 325 (65 FE) MG tablet    Sig: Take 2 tablets (650 mg total) by mouth every other day.    Dispense:  60 tablet    Refill:  5   Orders Placed This Encounter  Procedures   HIV antibody (with reflex)   RPR   Hepatitis B Surface AntiGEN   Hepatitis C Antibody     Brock Bad, MD 09/08/2021 9:20 AM

## 2021-09-08 LAB — HIV ANTIBODY (ROUTINE TESTING W REFLEX): HIV Screen 4th Generation wRfx: NONREACTIVE

## 2021-09-08 LAB — HEPATITIS B SURFACE ANTIGEN: Hepatitis B Surface Ag: NEGATIVE

## 2021-09-08 LAB — RPR: RPR Ser Ql: NONREACTIVE

## 2021-09-08 LAB — HEPATITIS C ANTIBODY: Hep C Virus Ab: NONREACTIVE

## 2021-09-11 ENCOUNTER — Other Ambulatory Visit: Payer: Self-pay | Admitting: Medical-Surgical

## 2021-09-11 LAB — CERVICOVAGINAL ANCILLARY ONLY
Bacterial Vaginitis (gardnerella): POSITIVE — AB
Candida Glabrata: NEGATIVE
Candida Vaginitis: NEGATIVE
Chlamydia: NEGATIVE
Comment: NEGATIVE
Comment: NEGATIVE
Comment: NEGATIVE
Comment: NEGATIVE
Comment: NEGATIVE
Comment: NORMAL
Neisseria Gonorrhea: NEGATIVE
Trichomonas: NEGATIVE

## 2021-09-11 LAB — CYTOLOGY - PAP
Adequacy: ABSENT
Comment: NEGATIVE
Diagnosis: NEGATIVE
High risk HPV: NEGATIVE

## 2021-09-12 ENCOUNTER — Ambulatory Visit: Payer: Medicaid Other

## 2021-09-12 ENCOUNTER — Other Ambulatory Visit: Payer: Self-pay | Admitting: Obstetrics

## 2021-09-12 DIAGNOSIS — B9689 Other specified bacterial agents as the cause of diseases classified elsewhere: Secondary | ICD-10-CM

## 2021-09-12 MED ORDER — METRONIDAZOLE 500 MG PO TABS: 500.0000 mg | ORAL_TABLET | Freq: Two times a day (BID) | ORAL | 2 refills | Status: DC

## 2021-09-19 ENCOUNTER — Ambulatory Visit: Payer: Medicaid Other

## 2021-10-03 DIAGNOSIS — R1030 Lower abdominal pain, unspecified: Secondary | ICD-10-CM | POA: Diagnosis not present

## 2021-10-03 DIAGNOSIS — Z880 Allergy status to penicillin: Secondary | ICD-10-CM | POA: Diagnosis not present

## 2021-10-03 DIAGNOSIS — N3289 Other specified disorders of bladder: Secondary | ICD-10-CM | POA: Diagnosis not present

## 2021-10-03 DIAGNOSIS — Z885 Allergy status to narcotic agent status: Secondary | ICD-10-CM | POA: Diagnosis not present

## 2021-10-03 DIAGNOSIS — R112 Nausea with vomiting, unspecified: Secondary | ICD-10-CM | POA: Diagnosis not present

## 2021-10-03 DIAGNOSIS — R1084 Generalized abdominal pain: Secondary | ICD-10-CM | POA: Diagnosis not present

## 2021-10-03 DIAGNOSIS — K5730 Diverticulosis of large intestine without perforation or abscess without bleeding: Secondary | ICD-10-CM | POA: Diagnosis not present

## 2021-10-06 ENCOUNTER — Encounter: Payer: Self-pay | Admitting: Medical-Surgical

## 2021-10-06 ENCOUNTER — Ambulatory Visit: Payer: Medicaid Other | Admitting: Medical-Surgical

## 2021-10-06 VITALS — BP 128/77 | HR 76 | Resp 20 | Ht 63.0 in | Wt 354.8 lb

## 2021-10-06 DIAGNOSIS — Z6841 Body Mass Index (BMI) 40.0 and over, adult: Secondary | ICD-10-CM | POA: Diagnosis not present

## 2021-10-06 DIAGNOSIS — N200 Calculus of kidney: Secondary | ICD-10-CM | POA: Diagnosis not present

## 2021-10-06 DIAGNOSIS — R300 Dysuria: Secondary | ICD-10-CM | POA: Diagnosis not present

## 2021-10-06 DIAGNOSIS — R1084 Generalized abdominal pain: Secondary | ICD-10-CM | POA: Diagnosis not present

## 2021-10-06 LAB — POCT URINALYSIS DIP (CLINITEK)
Bilirubin, UA: NEGATIVE
Blood, UA: NEGATIVE
Glucose, UA: NEGATIVE mg/dL
Ketones, POC UA: NEGATIVE mg/dL
Leukocytes, UA: NEGATIVE
Nitrite, UA: NEGATIVE
POC PROTEIN,UA: NEGATIVE
Spec Grav, UA: 1.02 (ref 1.010–1.025)
Urobilinogen, UA: 0.2 E.U./dL
pH, UA: 5.5 (ref 5.0–8.0)

## 2021-10-06 MED ORDER — PHENTERMINE HCL 15 MG PO CAPS: 15.0000 mg | ORAL_CAPSULE | ORAL | 0 refills | Status: DC

## 2021-10-06 MED ORDER — TAMSULOSIN HCL 0.4 MG PO CAPS: 0.4000 mg | ORAL_CAPSULE | Freq: Every day | ORAL | 3 refills | Status: DC

## 2021-10-06 NOTE — Progress Notes (Signed)
Established Patient Office Visit  Subjective   Patient ID: [name], female   DOB: 02/20/1981 Age: 41 y.o. MRN: [id number]   Chief Complaint  Patient presents with   Hospitalization Follow-up    HPI Pleasant 41 year old female presenting today for hospital discharge follow up. Was seen in the ED on 7/ for severe abdominal pain that had started that morning. She had blood work and an abdominal/pelvis CT done but no abnormalities were found to explain her symptoms. She was prescribed Zofran for nausea but has not picked that up yet. Plans to get it today. Reports at discharge, she was told to just see her PCP. Today, she continues to experience generalized abdominal pain that changes to midline if she lays down. She has continued to have nausea along with a very poor appetite. Has barely been eating but when she dose, she focuses on crackers, ginger ale, etc. Has been taking Ibuprofen three times daily in hopes to contain the pain level to a manageable level. Reports she had a similar pain a few months ago. She went to [facility] where they told her she has large left kidney stones that were too big to pass. She was told to start on a medication that would relax the urinary vessels but the provider there never called it in to her pharmacy. On her recent CT, no kidney stones were identified.    Objective:    Vitals:   10/06/21 1615  BP: 128/77  Pulse: 76  Resp: 20  Height: 5\' 3"  (1.6 m)  Weight: (!) 354 lb 12.8 oz (160.9 kg)  SpO2: 98%  BMI (Calculated): 62.87   Physical Exam Vitals and nursing note reviewed.  Constitutional:      General: She is not in acute distress.    Appearance: Normal appearance. She is obese. She is not ill-appearing.  HENT:     Head: Normocephalic and atraumatic.  Cardiovascular:     Rate and Rhythm: Normal rate and regular rhythm.     Pulses: Normal pulses.     Heart sounds: Normal heart sounds.  Pulmonary:     Effort: Pulmonary effort  is normal. No respiratory distress.     Breath sounds: Normal breath sounds. No wheezing, rhonchi or rales.  Abdominal:     General: Bowel sounds are normal. There is no distension.     Palpations: Abdomen is soft. There is no mass.     Tenderness: There is abdominal tenderness. There is no guarding or rebound.     Hernia: No hernia is present.  Musculoskeletal:        General: Normal range of motion.  Skin:    General: Skin is warm and dry.  Neurological:     Mental Status: She is alert and oriented to person, place, and time.  Psychiatric:        Mood and Affect: Mood normal.        Behavior: Behavior normal.        Thought Content: Thought content normal.        Judgment: Judgment normal.    Results for orders placed or performed in visit on 10/06/21 (from the past 24 hour(s))  POCT URINALYSIS DIP (CLINITEK)     Status: None   Collection Time: 10/06/21  4:33 PM  Result Value Ref Range   Color, UA yellow yellow   Clarity, UA clear clear   Glucose, UA negative negative mg/dL   Bilirubin, UA negative negative   Ketones, POC UA  negative negative mg/dL   Spec Grav, UA 5.621 3.086 - 1.025   Blood, UA negative negative   pH, UA 5.5 5.0 - 8.0   POC PROTEIN,UA negative negative, trace   Urobilinogen, UA 0.2 0.2 or 1.0 E.U./dL   Nitrite, UA Negative Negative   Leukocytes, UA Negative Negative       The 10-year ASCVD risk score (Arnett DK, et al., 2019) is: 0.5%   Values used to calculate the score:     Age: 64 years     Sex: Female     Is Non-Hispanic African American: Yes     Diabetic: No     Tobacco smoker: No     Systolic Blood Pressure: 128 mmHg     Is BP treated: No     HDL Cholesterol: 60 mg/dL     Total Cholesterol: 172 mg/dL   Assessment & Plan:   1. Dysuria POCT UA  negative.  - POCT URINALYSIS DIP (CLINITEK)  2. Kidney stone Referring to Urology. Start Flomax 0.4mg  daily. Requesting records from [facility] for updating our records.  - Ambulatory  referral to Urology  3. Generalized abdominal pain Unclear etiology. Consider issues with constipation since patient does report having irregular and unsatisfying bowel movements. Recommend adding Miralax 17g twice daily for up to 7 days. Possibly related to kidney stones, see above. Stay well hydrated and monitor symptoms for worsening or connection to activities or intake that help symptoms or worsen them.   4. Morbid obesity with body mass index of 60.0-69.9 in adult [facility]) Patient requested refill of phentermine. Advised to avoid starting the medication while already having stomach issues like pain or constipation. Patient agreed to hold off but would like to go ahead and get the medicine to have on hand for when her symptoms improve. After discuss, phentermine 15mg  daily sent to the pharmacy on file.   Return in about 4 weeks (around 11/03/2021) for weight check.  ___________________________________________ 01/03/2022, DNP, APRN, FNP-BC Primary Care and Sports Medicine [facility] [place]

## 2021-10-16 ENCOUNTER — Encounter: Payer: Self-pay | Admitting: Medical-Surgical

## 2021-11-10 ENCOUNTER — Ambulatory Visit: Payer: Medicaid Other | Admitting: Medical-Surgical

## 2021-11-28 ENCOUNTER — Encounter (HOSPITAL_COMMUNITY): Payer: Self-pay | Admitting: Emergency Medicine

## 2021-11-28 ENCOUNTER — Ambulatory Visit (HOSPITAL_COMMUNITY)
Admission: EM | Admit: 2021-11-28 | Discharge: 2021-11-28 | Disposition: A | Discharge status: Home / Self Care | Payer: Medicaid Other | Attending: Internal Medicine | Admitting: Internal Medicine

## 2021-11-28 ENCOUNTER — Telehealth (HOSPITAL_COMMUNITY): Payer: Self-pay

## 2021-11-28 ENCOUNTER — Ambulatory Visit (INDEPENDENT_AMBULATORY_CARE_PROVIDER_SITE_OTHER): Payer: Medicaid Other

## 2021-11-28 DIAGNOSIS — M19012 Primary osteoarthritis, left shoulder: Secondary | ICD-10-CM

## 2021-11-28 DIAGNOSIS — M25512 Pain in left shoulder: Secondary | ICD-10-CM

## 2021-11-28 MED ORDER — DICLOFENAC SODIUM 1 % EX GEL: 4.0000 g | Freq: Four times a day (QID) | CUTANEOUS | 2 refills | Status: DC

## 2021-11-28 MED ORDER — BACLOFEN 10 MG PO TABS: 10.0000 mg | ORAL_TABLET | Freq: Every day | ORAL | 0 refills | Status: AC

## 2021-11-28 MED ORDER — ACETAMINOPHEN 500 MG PO TABS
1000.0000 mg | ORAL_TABLET | Freq: Three times a day (TID) | ORAL | 0 refills | Status: DC
Start: 2021-11-28 — End: 2021-11-28

## 2021-11-28 MED ORDER — KETOROLAC TROMETHAMINE 30 MG/ML IJ SOLN
INTRAMUSCULAR | Status: AC
  Filled 2021-11-28: qty 1

## 2021-11-28 MED ORDER — METHYLPREDNISOLONE 4 MG PO TBPK: ORAL_TABLET | ORAL | 0 refills | Status: DC

## 2021-11-28 MED ORDER — DICLOFENAC SODIUM 1 % EX GEL
4.0000 g | Freq: Four times a day (QID) | CUTANEOUS | 2 refills | Status: DC
Start: 2021-11-28 — End: 2021-11-28

## 2021-11-28 MED ORDER — KETOROLAC TROMETHAMINE 30 MG/ML IJ SOLN
30.0000 mg | Freq: Once | INTRAMUSCULAR | Status: AC
  Administered 2021-11-28: 30 mg via INTRAMUSCULAR

## 2021-11-28 MED ORDER — BACLOFEN 10 MG PO TABS
10.0000 mg | ORAL_TABLET | Freq: Every day | ORAL | 0 refills | Status: DC
Start: 2021-11-28 — End: 2021-11-28

## 2021-11-28 MED ORDER — BACLOFEN 10 MG PO TABS: 10.0000 mg | ORAL_TABLET | Freq: Every day | ORAL | 0 refills | Status: DC

## 2021-11-28 MED ORDER — ACETAMINOPHEN 500 MG PO TABS: 1000.0000 mg | ORAL_TABLET | Freq: Three times a day (TID) | ORAL | 0 refills | Status: AC

## 2021-11-28 NOTE — ED Triage Notes (Signed)
Pt presents with chest pain. Onset lastnight when working. States the pain is radiating into left arm and penetrating into back. Rates pain 7/10. States took aspirin lastnight and again this morning. Denies cardiac history.

## 2021-11-28 NOTE — ED Provider Notes (Signed)
[facility]    CSN: [id number] Arrival date & time: 11/28/21  1546    HISTORY   Chief Complaint  Patient presents with   Chest Pain   HPI [name] is a pleasant, 41 y.o. female who presents to urgent care today. Pt presents with chest pain. Onset lastnight when working. States the pain is radiating into left arm and penetrating into back. Rates pain 7/10. States took aspirin last night and again this morning which helped for a little while. Denies cardiac history, EKG today is normal.  Patient states that she moved to a new apartment last weekend and is still unpacking her things and breaking down boxes.  Patient states she also takes ibuprofen for pain at times but has not tried this yet.  The history is provided by the patient.   Past Medical History:  Diagnosis Date   [redacted] weeks gestation of pregnancy    Advanced maternal age in multigravida 01/17/2015   Arthritis    "back; right foot; left ankle" (07/22/2014)   Cellulitis of leg, right 07/2014   Chronic lower back pain    History of COVID-19 06/21/2020   Morbid obesity with BMI of 50.0-59.9, adult (HCC)    Prediabetes    S/P cesarean section 07/20/2015   Vaginal delivery 1998   Patient Active Problem List   Diagnosis Date Noted   Chronic bilateral low back pain with right-sided sciatica 06/21/2020   Gastroenteritis 06/16/2015   Anemia 07/24/2014   Morbid obesity with body mass index of 60.0-69.9 in adult [facility]) 07/22/2014   Cellulitis of right lower extremity 07/22/2014   Prediabetes 07/22/2014   Fissure in skin of foot 07/22/2014   GERD without esophagitis 08/28/2012   Past Surgical History:  Procedure Laterality Date   CESAREAN SECTION  1994; 2010   CESAREAN SECTION N/A 07/20/2015   Procedure: REPEAT CESAREAN SECTION;  Surgeon: Brock Bad, MD;  Location: [facility];  Service: Obstetrics;  Laterality: N/A;   DILATION AND CURETTAGE OF UTERUS  ~ 2008   TUBAL LIGATION     OB History     Gravida  5    Para  4   Term  4   Preterm      AB  1   Living  4      SAB  1   IAB      Ectopic      Multiple  0   Live Births  4          Home Medications    Prior to Admission medications   Medication Sig Start Date End Date Taking? Authorizing Provider  ferrous sulfate 325 (65 FE) MG tablet Take 2 tablets (650 mg total) by mouth every other day. 09/07/21   Brock Bad, MD  ibuprofen (ADVIL) 800 MG tablet Take 1 tablet (800 mg total) by mouth every 8 (eight) hours as needed. 09/07/21   Brock Bad, MD  phentermine 15 MG capsule Take 1 capsule (15 mg total) by mouth every morning. 10/06/21   Christen Butter, NP  tamsulosin (FLOMAX) 0.4 MG CAPS capsule Take 1 capsule (0.4 mg total) by mouth daily. 10/06/21   Christen Butter, NP    Family History Family History  Problem Relation Age of Onset   Hypertension Mother    Diabetes Father    Prostate cancer Father    Diabetes Maternal Grandfather    Heart attack Other    Social History Social History   Tobacco Use  Smoking status: Never   Smokeless tobacco: Never  Vaping Use   Vaping Use: Never used  Substance Use Topics   Alcohol use: Not Currently    Alcohol/week: 0.0 standard drinks of alcohol   Drug use: No   Allergies   Penicillin g, Penicillins, and Tramadol hcl  Review of Systems Review of Systems Pertinent findings revealed after performing a 14 point review of systems has been noted in the history of present illness.  Physical Exam Triage Vital Signs ED Triage Vitals  Enc Vitals Group     BP 01/27/21 0827 (!) 147/82     Pulse Rate 01/27/21 0827 72     Resp 01/27/21 0827 18     Temp 01/27/21 0827 98.3 F (36.8 C)     Temp Source 01/27/21 0827 Oral     SpO2 01/27/21 0827 98 %     Weight --      Height --      Head Circumference --      Peak Flow --      Pain Score 01/27/21 0826 5     Pain Loc --      Pain Edu? --      Excl. in GC? --    Updated Vital Signs BP 137/71 (BP Location: Right Wrist)    Pulse 74   Temp 98 F (36.7 C) (Oral)   Resp 18   SpO2 100%   Physical Exam Vitals and nursing note reviewed.  Constitutional:      General: She is awake. She is not in acute distress.    Appearance: Normal appearance. She is well-developed and well-groomed. She is morbidly obese. She is not ill-appearing, toxic-appearing or diaphoretic.  HENT:     Head: Normocephalic and atraumatic.  Eyes:     Pupils: Pupils are equal, round, and reactive to light.  Cardiovascular:     Rate and Rhythm: Normal rate and regular rhythm.  Pulmonary:     Effort: Pulmonary effort is normal.     Breath sounds: Normal breath sounds.  Musculoskeletal:     Right shoulder: Normal.     Left shoulder: Tenderness and bony tenderness present. No swelling, deformity, effusion, laceration or crepitus. Decreased range of motion. Decreased strength. Normal pulse.     Cervical back: Neck supple. Spasms and tenderness present. No swelling, rigidity, torticollis or bony tenderness. No pain with movement. Decreased range of motion.     Thoracic back: Normal.  Skin:    General: Skin is warm and dry.  Neurological:     General: No focal deficit present.     Mental Status: She is alert and oriented to person, place, and time. Mental status is at baseline.  Psychiatric:        Mood and Affect: Mood normal.        Behavior: Behavior normal. Behavior is cooperative.        Thought Content: Thought content normal.        Judgment: Judgment normal.     [facility] Couse / Diagnostics / Procedures:     Radiology DG Shoulder Left  Result Date: 11/28/2021 CLINICAL DATA:  Shoulder pain. EXAM: LEFT SHOULDER - 2+ VIEW COMPARISON:  None Available. FINDINGS: There is no evidence of fracture or dislocation. There is no evidence of arthropathy or other focal bone abnormality. Soft tissues are unremarkable. IMPRESSION: Negative. Electronically Signed   By: Darliss CheneyAmy  Guttmann M.D.   On: 11/28/2021 17:34    Procedures Procedures (including  critical care time) EKG  Pending results:  Labs Reviewed - No data to display  Medications Ordered in UC: Medications  ketorolac (TORADOL) 30 MG/ML injection 30 mg (30 mg Intramuscular Given 11/28/21 1715)    UC Diagnoses / Final Clinical Impressions(s)   I have reviewed the triage vital signs and the nursing notes.  Pertinent labs & imaging results that were available during my care of the patient were reviewed by me and considered in my medical decision making (see chart for details).    Final diagnoses:  Acute pain of left shoulder  Arthritis of shoulder region, left    Patient was provided with an injection during their visit today for acute pain relief.  Patient was advised to:  Begin Medrol dose pack Take baclofen once daily 1 hour prior to bedtime Begin acetaminophen 1000 mg 3 times daily on a scheduled basis. Apply ice pack to affected area 4 times daily for 20 minutes each time Apply topical Voltaren gel 4 times daily as needed Avoid stretching or strengthening exercises until pain is completely resolved Consider physical therapy, chiropractic care, orthopedic follow-up Return precautions advised  ED Prescriptions     Medication Sig Dispense Auth. Provider   methylPREDNISolone (MEDROL DOSEPAK) 4 MG TBPK tablet Take 24 mg on day 1, 20 mg on day 2, 16 mg on day 3, 12 mg on day 4, 8 mg on day 5, 4 mg on day 6.  Take all tablets in each row at once, do not spread tablets out throughout the day. 21 tablet Theadora Rama Scales, PA-C   acetaminophen (TYLENOL) 500 MG tablet Take 2 tablets (1,000 mg total) by mouth every 8 (eight) hours. 180 tablet Theadora Rama Scales, PA-C   diclofenac Sodium (VOLTAREN) 1 % GEL Apply 4 g topically 4 (four) times daily. Apply to affected areas 4 times daily as needed for pain. 100 g Theadora Rama Scales, PA-C   baclofen (LIORESAL) 10 MG tablet Take 1 tablet (10 mg total) by mouth at bedtime for 7 days. 7 tablet Theadora Rama Scales,  PA-C      PDMP not reviewed this encounter.  Discharge Instructions:   Discharge Instructions      The x-ray of your left shoulder is not concerning for any acute injury however, in my personal opinion, there are early signs of arthritis in your left shoulder joint.  I believe that your recent move coupled with the regular work that you do has caused your otherwise mild arthritis to flare up.  The mainstay of therapy for musculoskeletal pain is reduction of inflammation and relaxation of tension which is causing inflammation.  Keep in mind, pain always begets more pain.  To help you stay ahead of your pain and inflammation, I have provided the following regimen for you:   During your visit today, you received an injection of ketorolac, high-dose nonsteroidal anti-inflammatory pain medication that should significantly reduce your pain for the next 6 to 8 hours.    Please begin taking Tylenol 1000 mg 3 times daily (every 8 hours) as soon as you pick up your prescriptions from the pharmacy.   This evening, please begin taking baclofen 10 mg.  This is a highly effective muscle relaxer and antispasmodic which should continue to provide you with relaxation of your tense muscles, allow you to sleep well and to keep your pain under control.   Tomorrow morning, please begin taking methylprednisolone.  Please take 1 full row tablets at once with your breakfast meal.  If you have had significant resolution  of your pain before you finish the entire prescription, please feel free to discontinue.  It is not important to finish every ta dose blet.   During the day, please set aside time to apply ice to the affected area 4 times daily for 20 minutes each application.  This can be achieved by using a bag of frozen peas or corn, a Ziploc bag filled with ice and water, or Ziploc bag filled with half rubbing alcohol and half Dawn dish detergent, frozen into a slush.  Please be careful not to apply ice directly  to your skin, always place a soft cloth between you and the ice pack.   You are welcome to use topical anti-inflammatory creams such as Voltaren gel, capsaicin or Aspercreme as recommended.  These medications are available over-the-counter, please follow manufactures instructions for use.  As a courtesy, I provided you with a prescription for diclofenac in the event that your insurance will pay for this.   Please consider discussing referral to physical therapy with your primary care provider.  Physical therapist are very good at teasing out the underlying cause of acute lower back pain and helping with prevention of future recurrences.   Please avoid attempts to stretch or strengthen the affected area until you are feeling completely pain-free.  Attempts to do so will only prolong the healing process.   I also recommend that you remain out of work for the next several days, I provided you with a note to return to work in 3 days.  If you feel that you need this time extended, please follow-up with your primary care provider or return to urgent care for reevaluation so that we can provide you with a note for another 3 days.   Thank you for visiting urgent care today.  We appreciate the opportunity to participate in your care.      Disposition Upon Discharge:  Condition: stable for discharge home Home: take medications as prescribed; routine discharge instructions as discussed; follow up as advised.  Patient presented with an acute illness with associated systemic symptoms and significant discomfort requiring urgent management. In my opinion, this is a condition that a prudent lay person (someone who possesses an average knowledge of health and medicine) may potentially expect to result in complications if not addressed urgently such as respiratory distress, impairment of bodily function or dysfunction of bodily organs.   Routine symptom specific, illness specific and/or disease specific  instructions were discussed with the patient and/or caregiver at length.   As such, the patient has been evaluated and assessed, work-up was performed and treatment was provided in alignment with urgent care protocols and evidence based medicine.  Patient/parent/caregiver has been advised that the patient may require follow up for further testing and treatment if the symptoms continue in spite of treatment, as clinically indicated and appropriate.  Patient/parent/caregiver has been advised to report to orthopedic urgent care clinic or return to the [facility] or PCP in 3-5 days if no better; follow-up with orthopedics, PCP or the Emergency Department if new signs and symptoms develop or if the current signs or symptoms continue to change or worsen for further workup, evaluation and treatment as clinically indicated and appropriate  The patient will follow up with their current PCP if and as advised. If the patient does not currently have a PCP we will have assisted them in obtaining one.   The patient may need specialty follow up if the symptoms continue, in spite of conservative treatment and management,  for further workup, evaluation, consultation and treatment as clinically indicated and appropriate.  Patient/parent/caregiver verbalized understanding and agreement of plan as discussed.  All questions were addressed during visit.  Please see discharge instructions below for further details of plan.  This office note has been dictated using Teaching laboratory technician.  Unfortunately, this method of dictation can sometimes lead to typographical or grammatical errors.  I apologize for your inconvenience in advance if this occurs.  Please do not hesitate to reach out to me if clarification is needed.      Theadora Rama Scales, PA-C 11/28/21 1739

## 2021-11-28 NOTE — Discharge Instructions (Addendum)
The x-ray of your left shoulder is not concerning for any acute injury however, in my personal opinion, there are early signs of arthritis in your left shoulder joint.  I believe that your recent move coupled with the regular work that you do has caused your otherwise mild arthritis to flare up.  The mainstay of therapy for musculoskeletal pain is reduction of inflammation and relaxation of tension which is causing inflammation.  Keep in mind, pain always begets more pain.  To help you stay ahead of your pain and inflammation, I have provided the following regimen for you:   During your visit today, you received an injection of ketorolac, high-dose nonsteroidal anti-inflammatory pain medication that should significantly reduce your pain for the next 6 to 8 hours.    Please begin taking Tylenol 1000 mg 3 times daily (every 8 hours) as soon as you pick up your prescriptions from the pharmacy.   This evening, please begin taking baclofen 10 mg.  This is a highly effective muscle relaxer and antispasmodic which should continue to provide you with relaxation of your tense muscles, allow you to sleep well and to keep your pain under control.   Tomorrow morning, please begin taking methylprednisolone.  Please take 1 full row tablets at once with your breakfast meal.  If you have had significant resolution of your pain before you finish the entire prescription, please feel free to discontinue.  It is not important to finish every ta dose blet.   During the day, please set aside time to apply ice to the affected area 4 times daily for 20 minutes each application.  This can be achieved by using a bag of frozen peas or corn, a Ziploc bag filled with ice and water, or Ziploc bag filled with half rubbing alcohol and half Dawn dish detergent, frozen into a slush.  Please be careful not to apply ice directly to your skin, always place a soft cloth between you and the ice pack.   You are welcome to use topical  anti-inflammatory creams such as Voltaren gel, capsaicin or Aspercreme as recommended.  These medications are available over-the-counter, please follow manufactures instructions for use.  As a courtesy, I provided you with a prescription for diclofenac in the event that your insurance will pay for this.   Please consider discussing referral to physical therapy with your primary care provider.  Physical therapist are very good at teasing out the underlying cause of acute lower back pain and helping with prevention of future recurrences.   Please avoid attempts to stretch or strengthen the affected area until you are feeling completely pain-free.  Attempts to do so will only prolong the healing process.   I also recommend that you remain out of work for the next several days, I provided you with a note to return to work in 3 days.  If you feel that you need this time extended, please follow-up with your primary care provider or return to urgent care for reevaluation so that we can provide you with a note for another 3 days.   Thank you for visiting urgent care today.  We appreciate the opportunity to participate in your care.

## 2021-12-14 ENCOUNTER — Ambulatory Visit (INDEPENDENT_AMBULATORY_CARE_PROVIDER_SITE_OTHER): Payer: Medicaid Other

## 2021-12-14 ENCOUNTER — Ambulatory Visit: Payer: Medicaid Other | Admitting: Podiatry

## 2021-12-14 ENCOUNTER — Encounter: Payer: Self-pay | Admitting: Podiatrist

## 2021-12-14 ENCOUNTER — Ambulatory Visit: Payer: Medicaid Other | Admitting: Podiatrist

## 2021-12-14 DIAGNOSIS — M19072 Primary osteoarthritis, left ankle and foot: Secondary | ICD-10-CM | POA: Diagnosis not present

## 2021-12-14 DIAGNOSIS — M19079 Primary osteoarthritis, unspecified ankle and foot: Secondary | ICD-10-CM

## 2021-12-14 DIAGNOSIS — M722 Plantar fascial fibromatosis: Secondary | ICD-10-CM | POA: Diagnosis not present

## 2021-12-14 DIAGNOSIS — M79672 Pain in left foot: Secondary | ICD-10-CM

## 2021-12-14 DIAGNOSIS — M19071 Primary osteoarthritis, right ankle and foot: Secondary | ICD-10-CM

## 2021-12-14 DIAGNOSIS — M79671 Pain in right foot: Secondary | ICD-10-CM

## 2021-12-14 MED ORDER — UREA 40 % EX CREA: 1.0000 | TOPICAL_CREAM | Freq: Two times a day (BID) | CUTANEOUS | 3 refills | Status: DC

## 2021-12-14 MED ORDER — TRIAMCINOLONE ACETONIDE 10 MG/ML IJ SUSP
10.0000 mg | Freq: Once | INTRAMUSCULAR | Status: AC
  Administered 2021-12-14: 10 mg

## 2021-12-14 MED ORDER — MELOXICAM 15 MG PO TABS: 15.0000 mg | ORAL_TABLET | Freq: Every day | ORAL | 2 refills | Status: DC

## 2021-12-14 NOTE — Progress Notes (Unsigned)
Chief Complaint  Patient presents with   Foot Pain    Right top of the foot  started 13 years ago with a right foot fracture , and left heel started 3 weeks ago, Bilateral dry cracked skin,Rate of pain 8/10, X-Rays done      HPI: Patient is 41 y.o. female who presents today for pain top of right foot and heel pain left. Top of right foot feels like a tooth ache and hurts at night.  She relates she fractured her right foot 13 years ago.  She denies any recent injury.   Patient Active Problem List   Diagnosis Date Noted   Chronic bilateral low back pain with right-sided sciatica 06/21/2020   Gastroenteritis 06/16/2015   Anemia 07/24/2014   Morbid obesity with body mass index of 60.0-69.9 in adult [facility]) 07/22/2014   Cellulitis of right lower extremity 07/22/2014   Prediabetes 07/22/2014   Fissure in skin of foot 07/22/2014   GERD without esophagitis 08/28/2012    Current Outpatient Medications on File Prior to Visit  Medication Sig Dispense Refill   acetaminophen (TYLENOL) 500 MG tablet Take 2 tablets (1,000 mg total) by mouth every 8 (eight) hours. 180 tablet 0   diclofenac Sodium (VOLTAREN) 1 % GEL Apply 4 g topically 4 (four) times daily. Apply to affected areas 4 times daily as needed for pain. 100 g 2   ferrous sulfate 325 (65 FE) MG tablet Take 2 tablets (650 mg total) by mouth every other day. 60 tablet 5   ibuprofen (ADVIL) 800 MG tablet Take 1 tablet (800 mg total) by mouth every 8 (eight) hours as needed. 30 tablet 5   phentermine 15 MG capsule Take 1 capsule (15 mg total) by mouth every morning. 30 capsule 0   tamsulosin (FLOMAX) 0.4 MG CAPS capsule Take 1 capsule (0.4 mg total) by mouth daily. 30 capsule 3   No current facility-administered medications on file prior to visit.    Allergies  Allergen Reactions   Penicillin G Rash   Penicillins Hives   Tramadol Hcl Other (See Comments)    hallucinations    Review of Systems No fevers, chills, nausea, muscle aches, no  difficulty breathing, no calf pain, no chest pain or shortness of breath.   Physical Exam  GENERAL APPEARANCE: Alert, conversant. Appropriately groomed. No acute distress.   VASCULAR: Pedal pulses palpable 2/4 DP and PT bilateral.  Capillary refill time is immediate to all digits,  Proximal to distal cooling it warm to warm.  Digital perfusion adequate.   NEUROLOGIC: sensation is intact to 5.07 monofilament at 5/5 sites bilateral.  Light touch is intact bilateral, vibratory sensation intact bilateral  MUSCULOSKELETAL: acceptable muscle strength, tone and stability bilateral. Pain plantar lateral and plantar medial right heel noted.  Pain dorsal aspect of the right foot at midfoot region noted with palpation pain plantar left heel as well.    DERMATOLOGIC: skin is warm, supple, and dry.  Xerotic, callused skin of the heels noted.  Digital nails are asymptomatic.   Xrays:  Right foot:  dorsal midfoot spurring noted with associated soft tissue inflammation in this area.  Pes planus foot structure seen. No acute osseous abnormalities noted. No inferior or posterior heel spur noted.    Leff foot:  pes planus foot structure seen.  Small inferior spur noted with increase in soft tissue density of plantar fascia at its insertion. No acute osseous abnormalities seen.   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 Bilateral plantar fasciitis  M72.2 DG Foot Complete Left    triamcinolone acetonide (KENALOG) 10 MG/ML injection 10 mg       Plan  Discussed exam and xray findings.  Recommended injecting the left heel and dorsal right mid foot.  The patient agreed and this was carried out at todays visit.  The skin was prepped with alcohol and injection of 10mg  kenalog and marcaine plain infiltrated in to dorsal right midfoot as well as plantar left foot.  She tolerated the injections well.  Advised on stretching exercises, shoegear recommendations given,  discussed otc inserts and rx meloxicam antiinflammatory.  Also recommended 40% urea cream and use of pumice stone on a regular basis for the heel calluses.  She will be seen back in 6 weeks for follow up and if any questions arise she will call.

## 2021-12-14 NOTE — Patient Instructions (Addendum)
Topical Urea 40% cream was called into your pharmacy-    There is a similar cream at [employer] that is $12-15 called oukeya urea cream.. there is also one [employer] and several simiilar ones that are the same active urea ingredient-  Apply to heels daily - once a week, soak your feet in warm epsom salt or soapy water.  And use a pumice stone to remove the dead skin.  There are also socks you can sleep in for cracked heels off [employer] that also work-- one is called ZenToes that seem to work well.    Flexitol heel balm is a good moisturizing heel balm that also works well to [employer] the heel skin.   Plantar Fasciitis (Heel Spur Syndrome) with Rehab The plantar fascia is a fibrous, ligament-like, soft-tissue structure that spans the bottom of the foot. Plantar fasciitis is a condition that causes pain in the foot due to inflammation of the tissue. SYMPTOMS  Pain and tenderness on the underneath side of the foot. Pain that worsens with standing or walking. CAUSES  Plantar fasciitis is caused by irritation and injury to the plantar fascia on the underneath side of the foot. Common mechanisms of injury include: Direct trauma to bottom of the foot. Damage to a small nerve that runs under the foot where the main fascia attaches to the heel bone. Stress placed on the plantar fascia due to any mild increased activity or injury RISK INCREASES WITH:  Obesity. Poor strength and flexibility. Improperly fitted shoes. Tight calf muscles. Flat feet. Failure to warm-up properly before activity.  PREVENTION Warm up and stretch properly before activity. Strength, flexibility Maintain a health body weight. Avoid stress on the plantar fascia. Wear properly fitted shoes, including arch supports for individuals who have flat feet. PROGNOSIS  If treated properly, then the symptoms of plantar fasciitis usually resolve without surgery. However, occasionally surgery is necessary. RELATED COMPLICATIONS   Recurrent symptoms that may result in a chronic condition. Problems of the lower back that are caused by compensating for the injury, such as limping. Pain or weakness of the foot during push-off following surgery. Chronic inflammation, scarring, and partial or complete fascia tear, occurring more often from repeated injections. TREATMENT  Treatment initially involves the use of ice and medication to help reduce pain and inflammation. The use of strengthening and stretching exercises may help reduce pain with activity, especially stretches of the Achilles tendon.  Your caregiver may recommend that you use arch supports to help reduce stress on the plantar fascia. Often, corticosteroid injections are given to reduce inflammation. If symptoms persist for greater than 6 months despite non-surgical (conservative), then surgery may be recommended.  MEDICATION  If pain medication is necessary, then nonsteroidal anti-inflammatory medications, such as aspirin and ibuprofen, or other minor pain relievers, such as acetaminophen, are often recommended. Corticosteroid injections may be given by your caregiver.  HEAT AND COLD Cold treatment (icing) relieves pain and reduces inflammation. Cold treatment should be applied for 10 to 15 minutes every 2 to 3 hours for inflammation and pain and immediately after any activity that aggravates your symptoms. Use ice packs or massage the area with a piece of ice (ice massage). Heat treatment may be used prior to performing the stretching and strengthening activities prescribed by your caregiver, physical therapist, or athletic trainer. Use a heat pack or soak the injury in warm water. SEEK IMMEDIATE MEDICAL CARE IF: Treatment seems to offer no benefit, or the condition worsens. Any medications produce adverse side effects.  Perform this particular stretch daily first thing in the morning and before you go to bed. Hold for 30 seconds.    Try all the exercises and choose  your favorite 3 to perform daily--  EXERCISES-- perform each exercise a total of 10-15 repetitions.  Hold for 30 seconds and perform 3 times per day   RANGE OF MOTION (ROM) AND STRETCHING EXERCISES - Plantar Fasciitis (Heel Spur Syndrome) These exercises may help you when beginning to rehabilitate your injury.   While completing these exercises, remember:  Restoring tissue flexibility helps normal motion to return to the joints. This allows healthier, less painful movement and activity. An effective stretch should be held for at least 30 seconds. A stretch should never be painful. You should only feel a gentle lengthening or release in the stretched tissue. RANGE OF MOTION - Toe Extension, Flexion Sit with your right / left leg crossed over your opposite knee. Grasp your toes and gently pull them back toward the top of your foot. You should feel a stretch on the bottom of your toes and/or foot. Hold this stretch for __________ seconds. Now, gently pull your toes toward the bottom of your foot. You should feel a stretch on the top of your toes and or foot. Hold this stretch for __________ seconds. Repeat __________ times. Complete this stretch __________ times per day.  RANGE OF MOTION - Ankle Dorsiflexion, Active Assisted Remove shoes and sit on a chair that is preferably not on a carpeted surface. Place right / left foot under knee. Extend your opposite leg for support. Keeping your heel down, slide your right / left foot back toward the chair until you feel a stretch at your ankle or calf. If you do not feel a stretch, slide your bottom forward to the edge of the chair, while still keeping your heel down. Hold this stretch for __________ seconds. Repeat __________ times. Complete this stretch __________ times per day.  STRETCH  Gastroc, Standing Place hands on wall. Extend right / left leg, keeping the front knee somewhat bent. Slightly point your toes inward on your back foot. Keeping  your right / left heel on the floor and your knee straight, shift your weight toward the wall, not allowing your back to arch. You should feel a gentle stretch in the right / left calf. Hold this position for __________ seconds. Repeat __________ times. Complete this stretch __________ times per day. STRETCH  Soleus, Standing Place hands on wall. Extend right / left leg, keeping the other knee somewhat bent. Slightly point your toes inward on your back foot. Keep your right / left heel on the floor, bend your back knee, and slightly shift your weight over the back leg so that you feel a gentle stretch deep in your back calf. Hold this position for __________ seconds. Repeat __________ times. Complete this stretch __________ times per day. STRETCH  Gastrocsoleus, Standing  Note: This exercise can place a lot of stress on your foot and ankle. Please complete this exercise only if specifically instructed by your caregiver.  Place the ball of your right / left foot on a step, keeping your other foot firmly on the same step. Hold on to the wall or a rail for balance. Slowly lift your other foot, allowing your body weight to press your heel down over the edge of the step. You should feel a stretch in your right / left calf. Hold this position for __________ seconds. Repeat this exercise with a slight bend  in your right / left knee. Repeat __________ times. Complete this stretch __________ times per day.  STRENGTHENING EXERCISES - Plantar Fasciitis (Heel Spur Syndrome)  These exercises may help you when beginning to rehabilitate your injury. They may resolve your symptoms with or without further involvement from your physician, physical therapist or athletic trainer. While completing these exercises, remember:  Muscles can gain both the endurance and the strength needed for everyday activities through controlled exercises. Complete these exercises as instructed by your physician, physical therapist or  athletic trainer. Progress the resistance and repetitions only as guided.

## 2021-12-21 ENCOUNTER — Encounter: Payer: Self-pay | Admitting: Podiatrist

## 2021-12-27 ENCOUNTER — Other Ambulatory Visit: Payer: Self-pay | Admitting: Podiatrist

## 2021-12-27 DIAGNOSIS — M19079 Primary osteoarthritis, unspecified ankle and foot: Secondary | ICD-10-CM

## 2022-01-23 ENCOUNTER — Ambulatory Visit (HOSPITAL_COMMUNITY)
Admission: EM | Admit: 2022-01-23 | Discharge: 2022-01-23 | Disposition: A | Discharge status: Home / Self Care | Payer: Medicaid Other | Attending: Internal Medicine | Admitting: Internal Medicine

## 2022-01-23 ENCOUNTER — Encounter (HOSPITAL_COMMUNITY): Payer: Self-pay | Admitting: Emergency Medicine

## 2022-01-23 DIAGNOSIS — R519 Headache, unspecified: Secondary | ICD-10-CM | POA: Insufficient documentation

## 2022-01-23 DIAGNOSIS — J069 Acute upper respiratory infection, unspecified: Secondary | ICD-10-CM | POA: Insufficient documentation

## 2022-01-23 DIAGNOSIS — R0981 Nasal congestion: Secondary | ICD-10-CM | POA: Diagnosis not present

## 2022-01-23 DIAGNOSIS — Z1152 Encounter for screening for COVID-19: Secondary | ICD-10-CM | POA: Diagnosis not present

## 2022-01-23 MED ORDER — ACETAMINOPHEN 325 MG PO TABS
ORAL_TABLET | ORAL | Status: AC
  Filled 2022-01-23: qty 3

## 2022-01-23 MED ORDER — ACETAMINOPHEN 325 MG PO TABS
975.0000 mg | ORAL_TABLET | Freq: Once | ORAL | Status: AC
  Administered 2022-01-23: 975 mg via ORAL

## 2022-01-23 MED ORDER — BENZONATATE 100 MG PO CAPS: 100.0000 mg | ORAL_CAPSULE | Freq: Three times a day (TID) | ORAL | 0 refills | Status: DC

## 2022-01-23 NOTE — ED Triage Notes (Signed)
Pt c/o congestion, sinus pain, ear pain and throat pain for a couple days. Taking Mucinex, theraflu and tea.

## 2022-01-23 NOTE — ED Provider Notes (Signed)
[facility]    CSN: [id number] Arrival date & time: 01/23/22  0813      History   Chief Complaint No chief complaint on file.   HPI [name] is a 41 y.o. female.   Patient presents to urgent care for evaluation of sudden onset sore throat, nasal congestion, body aches, chills, headache, dry cough, fatigue that started 3 days ago on January 21, 2022. Cough is productive with yellow sputum and worse at nighttime. Nasal congestion is thick.  Sore throat is worsened with swallowing. Headache is generalized to the head and is currently an 8 on a scale of 0-10.  Denies vision changes and dizziness. Reports chills but unknown highest temp at home. Denies shortness of breath, chest pain, nausea, vomiting, abdominal pain, diarrhea, and eye drainage. 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  Her children at home are not sick. Denies history of asthma or chronic respiratory problems. Patient is not a smoker and denies drug use. They are not vaccinated against COVID-19 and they have not received their seasonal flu vaccine this year.  Has attempted use of Mucinex, TheraFlu, and warm tea prior to arrival at urgent care for relief of symptoms without very much relief.  She also took ibuprofen 800 mg this morning at approximately 4 AM and continues to have significant headache and body aches.  She has not attempted use of any Tylenol at home.   prod  Past Medical History:  Diagnosis Date   [redacted] weeks gestation of pregnancy    Advanced maternal age in multigravida 01/17/2015   Arthritis    "back; right foot; left ankle" (07/22/2014)   Cellulitis of leg, right 07/2014   Chronic lower back pain    History of COVID-19 06/21/2020   Morbid obesity with BMI of 50.0-59.9, adult (HCC)    Prediabetes    S/P cesarean section 07/20/2015   Vaginal delivery 1998    Patient Active Problem List   Diagnosis Date Noted   Chronic bilateral low  back pain with right-sided sciatica 06/21/2020   Gastroenteritis 06/16/2015   Anemia 07/24/2014   Morbid obesity with body mass index of 60.0-69.9 in adult [facility]) 07/22/2014   Cellulitis of right lower extremity 07/22/2014   Prediabetes 07/22/2014   Fissure in skin of foot 07/22/2014   GERD without esophagitis 08/28/2012    Past Surgical History:  Procedure Laterality Date   CESAREAN SECTION  1994; 2010   CESAREAN SECTION N/A 07/20/2015   Procedure: REPEAT CESAREAN SECTION;  Surgeon: Brock Bad, MD;  Location: [facility];  Service: Obstetrics;  Laterality: N/A;   DILATION AND CURETTAGE OF UTERUS  ~ 2008   TUBAL LIGATION      OB History     Gravida  5   Para  4   Term  4   Preterm      AB  1   Living  4      SAB  1   IAB      Ectopic      Multiple  0   Live Births  4            Home Medications    Prior to Admission medications   Medication Sig Start Date End Date Taking? Authorizing Provider  benzonatate (TESSALON) 100 MG capsule Take 1 capsule (100 mg total) by mouth every 8 (eight) hours. 01/23/22  Yes Carlisle Beers, FNP  diclofenac Sodium (VOLTAREN)  1 % GEL Apply 4 g topically 4 (four) times daily. Apply to affected areas 4 times daily as needed for pain. 11/28/21   Theadora Rama Scales, PA-C  ferrous sulfate 325 (65 FE) MG tablet Take 2 tablets (650 mg total) by mouth every other day. 09/07/21   Brock Bad, MD  ibuprofen (ADVIL) 800 MG tablet Take 1 tablet (800 mg total) by mouth every 8 (eight) hours as needed. 09/07/21   Brock Bad, MD  meloxicam (MOBIC) 15 MG tablet Take 1 tablet (15 mg total) by mouth daily. 12/14/21   Delories Heinz, DPM  phentermine 15 MG capsule Take 1 capsule (15 mg total) by mouth every morning. 10/06/21   Christen Butter, NP  tamsulosin (FLOMAX) 0.4 MG CAPS capsule Take 1 capsule (0.4 mg total) by mouth daily. 10/06/21   Christen Butter, NP  urea (CARMOL) 40 % CREA Apply 1 Application topically 2 (two) times daily.  Apply to heels twice daily 12/14/21   Delories Heinz, DPM    Family History Family History  Problem Relation Age of Onset   Hypertension Mother    Diabetes Father    Prostate cancer Father    Diabetes Maternal Grandfather    Heart attack Other     Social History Social History   Tobacco Use   Smoking status: Never   Smokeless tobacco: Never  Vaping Use   Vaping Use: Never used  Substance Use Topics   Alcohol use: Not Currently    Alcohol/week: 0.0 standard drinks of alcohol   Drug use: No     Allergies   Penicillin g, Penicillins, and Tramadol hcl   Review of Systems Review of Systems Per HPI  Physical Exam Triage Vital Signs ED Triage Vitals  Enc Vitals Group     BP 01/23/22 0901 (!) 140/80     Pulse Rate 01/23/22 0901 62     Resp 01/23/22 0901 17     Temp 01/23/22 0901 98.2 F (36.8 C)     Temp Source 01/23/22 0901 Oral     SpO2 01/23/22 0901 98 %     Weight --      Height --      Head Circumference --      Peak Flow --      Pain Score 01/23/22 0900 6     Pain Loc --      Pain Edu? --      Excl. in GC? --    No data found.  Updated Vital Signs BP (!) 140/80 (BP Location: Left Arm)   Pulse 62   Temp 98.2 F (36.8 C) (Oral)   Resp 17   LMP 12/29/2021   SpO2 98%   Visual Acuity Right Eye Distance:   Left Eye Distance:   Bilateral Distance:    Right Eye Near:   Left Eye Near:    Bilateral Near:     Physical Exam Vitals and nursing note reviewed.  Constitutional:      Appearance: She is obese. She is ill-appearing. She is not toxic-appearing.  HENT:     Head: Normocephalic and atraumatic.     Right Ear: Hearing, tympanic membrane, ear canal and external ear normal.     Left Ear: Hearing, tympanic membrane, ear canal and external ear normal.     Nose: Congestion present.     Mouth/Throat:     Lips: Pink.     Mouth: Mucous membranes are moist.     Pharynx: No posterior oropharyngeal  erythema.     Comments: Small amount of clear  postnasal drainage visualized to the posterior oropharynx.  Eyes:     General: Lids are normal. Vision grossly intact. Gaze aligned appropriately.        Right eye: No discharge.        Left eye: No discharge.     Extraocular Movements: Extraocular movements intact.     Conjunctiva/sclera: Conjunctivae normal.     Pupils: Pupils are equal, round, and reactive to light.  Cardiovascular:     Rate and Rhythm: Normal rate and regular rhythm.     Heart sounds: Normal heart sounds, S1 normal and S2 normal.  Pulmonary:     Effort: Pulmonary effort is normal. No respiratory distress.     Breath sounds: Normal breath sounds and air entry.  Musculoskeletal:     Cervical back: Neck supple.  Lymphadenopathy:     Cervical: Cervical adenopathy present.  Skin:    General: Skin is warm and dry.     Capillary Refill: Capillary refill takes less than 2 seconds.     Findings: No rash.  Neurological:     General: No focal deficit present.     Mental Status: She is alert and oriented to person, place, and time. Mental status is at baseline.     Cranial Nerves: No dysarthria or facial asymmetry.  Psychiatric:        Mood and Affect: Mood normal.        Speech: Speech normal.        Behavior: Behavior normal.        Thought Content: Thought content normal.        Judgment: Judgment normal.      UC Treatments / Results  Labs (all labs ordered are listed, but only abnormal results are displayed) Labs Reviewed  SARS CORONAVIRUS 2 (TAT 6-24 HRS)    EKG   Radiology No results found.  Procedures Procedures (including critical care time)  Medications Ordered in UC Medications  acetaminophen (TYLENOL) tablet 975 mg (has no administration in time range)    Initial Impression / Assessment and Plan / UC Course  I have reviewed the triage vital signs and the nursing notes.  Pertinent labs & imaging results that were available during my care of the patient were reviewed by me and considered  in my medical decision making (see chart for details).   Viral URI with cough Symptoms and physical exam consistent with a viral upper respiratory tract infection that will likely resolve with rest, fluids, and prescriptions for symptomatic relief. No indication for imaging today based on stable cardiopulmonary exam and hemodynamically stable vital signs.  COVID-19 testing is pending.  We will call patient if this is positive.  Quarantine guidelines discussed. Currently on day 3 of symptoms and does qualify for antiviral therapy.    Patient given Tylenol in clinic today for headache and body aches.  Tessalon Perles sent to pharmacy for symptomatic relief to be taken as prescribed.  May continue using guaifenesin to thin mucus and help with cough.  May use ibuprofen/tylenol over the counter for body aches, fever/chills, and overall discomfort associated with viral illness. Nonpharmacologic interventions for symptom relief provided and after visit summary below.   Strict ED/urgent care return precautions given.  Patient verbalizes understanding and agreement with plan.  Counseled patient regarding possible side effects and uses of all medications prescribed at today's visit.  Patient verbalizes understanding and agreement with plan.  All questions answered.  Patient  discharged from urgent care in stable condition.         Final Clinical Impressions(s) / UC Diagnoses   Final diagnoses:  Viral URI with cough  Bad headache  Nasal congestion     Discharge Instructions      You have a viral upper respiratory infection.  COVID-19 testing is pending. We will call you with results if positive. If your COVID test is positive, you must stay at home until day 6 of symptoms. On day 6, you may go out into public and go back to work, but you must wear a mask until day 11 of symptoms to prevent spread to others.  Take guaifenesin 1200mg   every 12 hours to thin your mucous so that you can get it out of  your body easier with coughing/blowing your nose. Drink plenty of water while taking this medication so that it works well in your body (at least 8 cups a day).   Take tessalon pearles every 8 hours as needed for cough.  You may take tylenol 1,000mg  and ibuprofen 600mg  every 6 hours with food as needed for fever/chills, sore throat, aches/pains, and inflammation associated with viral illness. Take this with food to avoid stomach upset.    You may do salt water and baking soda gargles every 4 hours as needed for your throat pain.  Please put 1 teaspoon of salt and 1/2 teaspoon of baking soda in 8 ounces of warm water then gargle and spit the water out. You may also put 1 tablespoon of honey in warm water and drink this to soothe your throat.  Place a humidifier in your room at night to help decrease dry air that can irritate your airway and cause you to have a sore throat and cough.  Please try to eat a well-balanced diet while you are sick so that your body gets proper nutrition to heal.  If you develop any new or worsening symptoms, please return.  If your symptoms are severe, please go to the emergency room.  Follow-up with your primary care provider for further evaluation and management of your symptoms as well as ongoing wellness visits.  I hope you feel better!    ED Prescriptions     Medication Sig Dispense Auth. Provider   benzonatate (TESSALON) 100 MG capsule Take 1 capsule (100 mg total) by mouth every 8 (eight) hours. 21 capsule , FNP      PDMP not reviewed this encounter.   [place], Reita May 01/23/22 [phone]

## 2022-01-23 NOTE — Discharge Instructions (Addendum)
You have a viral upper respiratory infection.  COVID-19 testing is pending. We will call you with results if positive. If your COVID test is positive, you must stay at home until day 6 of symptoms. On day 6, you may go out into public and go back to work, but you must wear a mask until day 11 of symptoms to prevent spread to others.  Take guaifenesin 1200mg  every 12 hours to thin your mucous so that you can get it out of your body easier with coughing/blowing your nose. Drink plenty of water while taking this medication so that it works well in your body (at least 8 cups a day).   Take tessalon pearles every 8 hours as needed for cough.  You may take tylenol 1,000mg and ibuprofen 600mg every 6 hours with food as needed for fever/chills, sore throat, aches/pains, and inflammation associated with viral illness. Take this with food to avoid stomach upset.    You may do salt water and baking soda gargles every 4 hours as needed for your throat pain.  Please put 1 teaspoon of salt and 1/2 teaspoon of baking soda in 8 ounces of warm water then gargle and spit the water out. You may also put 1 tablespoon of honey in warm water and drink this to soothe your throat.  Place a humidifier in your room at night to help decrease dry air that can irritate your airway and cause you to have a sore throat and cough.  Please try to eat a well-balanced diet while you are sick so that your body gets proper nutrition to heal.  If you develop any new or worsening symptoms, please return.  If your symptoms are severe, please go to the emergency room.  Follow-up with your primary care provider for further evaluation and management of your symptoms as well as ongoing wellness visits.  I hope you feel better!  

## 2022-01-24 LAB — SARS CORONAVIRUS 2 (TAT 6-24 HRS): SARS Coronavirus 2: NEGATIVE

## 2022-02-01 ENCOUNTER — Encounter: Payer: Self-pay | Admitting: Podiatry

## 2022-02-01 ENCOUNTER — Ambulatory Visit: Payer: Medicaid Other | Admitting: Podiatry

## 2022-02-01 DIAGNOSIS — M722 Plantar fascial fibromatosis: Secondary | ICD-10-CM

## 2022-02-01 DIAGNOSIS — M778 Other enthesopathies, not elsewhere classified: Secondary | ICD-10-CM | POA: Diagnosis not present

## 2022-02-01 DIAGNOSIS — M19079 Primary osteoarthritis, unspecified ankle and foot: Secondary | ICD-10-CM | POA: Diagnosis not present

## 2022-02-01 MED ORDER — TRIAMCINOLONE ACETONIDE 40 MG/ML IJ SUSP
60.0000 mg | Freq: Once | INTRAMUSCULAR | Status: AC
  Administered 2022-02-01: 60 mg

## 2022-02-01 MED ORDER — MELOXICAM 15 MG PO TABS: 15.0000 mg | ORAL_TABLET | Freq: Every day | ORAL | 3 refills | Status: DC

## 2022-02-01 MED ORDER — METHYLPREDNISOLONE 4 MG PO TBPK: ORAL_TABLET | ORAL | 0 refills | Status: DC

## 2022-02-01 NOTE — Progress Notes (Signed)
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  States that they are both just killing her.  She did not take her meloxicam stating that she was taking ibuprofen and was told by the pharmacist not to take better and that she could get an upset stomach.  Objective: Vital signs are stable she is alert and oriented x3.  Has pain on palpation and frontal plane range of motion at the tarsometatarsal joints on the right foot.  She also has moderate to severe pain on palpation medial calcaneal tubercles left greater than right.  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  Of the TMT joints right foot.  Plan: Injected the dorsal aspect of the TMT joints right foot.  Plantar fascial injections bilateral.  Start her on methylprednisolone to be followed by meloxicam.  If no improvement consider MRI.  We also discussed briefly appropriate shoe gear stretching exercise ice therapy and shoe gear modifications.  I will follow-up with her in about 6 weeks

## 2022-02-13 ENCOUNTER — Other Ambulatory Visit: Payer: Self-pay

## 2022-02-13 ENCOUNTER — Encounter (HOSPITAL_BASED_OUTPATIENT_CLINIC_OR_DEPARTMENT_OTHER): Payer: Self-pay

## 2022-02-13 DIAGNOSIS — T68XXXA Hypothermia, initial encounter: Secondary | ICD-10-CM | POA: Diagnosis not present

## 2022-02-13 DIAGNOSIS — R252 Cramp and spasm: Secondary | ICD-10-CM | POA: Diagnosis not present

## 2022-02-13 DIAGNOSIS — M79605 Pain in left leg: Secondary | ICD-10-CM | POA: Diagnosis not present

## 2022-02-13 DIAGNOSIS — M79604 Pain in right leg: Secondary | ICD-10-CM | POA: Diagnosis not present

## 2022-02-13 DIAGNOSIS — M62838 Other muscle spasm: Secondary | ICD-10-CM | POA: Diagnosis not present

## 2022-02-13 DIAGNOSIS — M549 Dorsalgia, unspecified: Secondary | ICD-10-CM | POA: Diagnosis not present

## 2022-02-13 DIAGNOSIS — R9431 Abnormal electrocardiogram [ECG] [EKG]: Secondary | ICD-10-CM | POA: Diagnosis not present

## 2022-02-13 DIAGNOSIS — R109 Unspecified abdominal pain: Secondary | ICD-10-CM | POA: Diagnosis not present

## 2022-02-13 DIAGNOSIS — I959 Hypotension, unspecified: Secondary | ICD-10-CM | POA: Diagnosis not present

## 2022-02-13 LAB — CBC WITH DIFFERENTIAL/PLATELET
Abs Immature Granulocytes: 0.04 10*3/uL (ref 0.00–0.07)
Basophils Absolute: 0 10*3/uL (ref 0.0–0.1)
Basophils Relative: 0 %
Eosinophils Absolute: 0.1 10*3/uL (ref 0.0–0.5)
Eosinophils Relative: 1 %
HCT: 41.9 % (ref 36.0–46.0)
Hemoglobin: 12.9 g/dL (ref 12.0–15.0)
Immature Granulocytes: 0 %
Lymphocytes Relative: 20 %
Lymphs Abs: 2.4 10*3/uL (ref 0.7–4.0)
MCH: 25.1 pg — ABNORMAL LOW (ref 26.0–34.0)
MCHC: 30.8 g/dL (ref 30.0–36.0)
MCV: 81.7 fL (ref 80.0–100.0)
Monocytes Absolute: 0.7 10*3/uL (ref 0.1–1.0)
Monocytes Relative: 6 %
Neutro Abs: 8.3 10*3/uL — ABNORMAL HIGH (ref 1.7–7.7)
Neutrophils Relative %: 73 %
Platelets: 310 10*3/uL (ref 150–400)
RBC: 5.13 MIL/uL — ABNORMAL HIGH (ref 3.87–5.11)
RDW: 14.3 % (ref 11.5–15.5)
WBC: 11.5 10*3/uL — ABNORMAL HIGH (ref 4.0–10.5)
nRBC: 0 % (ref 0.0–0.2)

## 2022-02-13 LAB — COMPREHENSIVE METABOLIC PANEL
ALT: 11 U/L (ref 0–44)
AST: 12 U/L — ABNORMAL LOW (ref 15–41)
Albumin: 4.4 g/dL (ref 3.5–5.0)
Alkaline Phosphatase: 45 U/L (ref 38–126)
Anion gap: 8 (ref 5–15)
BUN: 16 mg/dL (ref 6–20)
CO2: 29 mmol/L (ref 22–32)
Calcium: 10.2 mg/dL (ref 8.9–10.3)
Chloride: 100 mmol/L (ref 98–111)
Creatinine, Ser: 0.92 mg/dL (ref 0.44–1.00)
GFR, Estimated: >60 mL/min (ref >60)
Glucose, Bld: 96 mg/dL (ref 70–99)
Potassium: 4 mmol/L (ref 3.5–5.1)
Sodium: 137 mmol/L (ref 135–145)
Total Bilirubin: 0.5 mg/dL (ref 0.3–1.2)
Total Protein: 8.2 g/dL — ABNORMAL HIGH (ref 6.5–8.1)

## 2022-02-13 NOTE — ED Triage Notes (Signed)
Pt via [facility] for eval of cramping in legs, abd, denies known electrolyte or dehydration issues. Reports today cramping in abd, legs, "all over." First noticed "a few weeks ago" when she fell on getting up to go the bathroom   VSS

## 2022-02-14 ENCOUNTER — Emergency Department (HOSPITAL_BASED_OUTPATIENT_CLINIC_OR_DEPARTMENT_OTHER)
Admission: EM | Admit: 2022-02-14 | Discharge: 2022-02-14 | Disposition: A | Discharge status: Home / Self Care | Payer: Medicaid Other | Attending: Emergency Medicine | Admitting: Emergency Medicine

## 2022-02-14 DIAGNOSIS — M791 Myalgia, unspecified site: Secondary | ICD-10-CM

## 2022-02-14 LAB — TSH: TSH: 5.165 u[IU]/mL — ABNORMAL HIGH (ref 0.350–4.500)

## 2022-02-14 LAB — SEDIMENTATION RATE: Sed Rate: 30 mm/h — ABNORMAL HIGH (ref 0–22)

## 2022-02-14 LAB — CK: Total CK: 155 U/L (ref 38–234)

## 2022-02-14 LAB — HCG, SERUM, QUALITATIVE: Preg, Serum: NEGATIVE

## 2022-02-14 NOTE — ED Provider Notes (Signed)
[facility] EMERGENCY DEPT Provider Note   CSN: [id number] Arrival date & time: 02/13/22  2110     History  Chief Complaint  Patient presents with   Abdominal Cramping   Leg Pain    [name] is a 41 y.o. female.  Patient is a 41 year old female brought by EMS for evaluation of body aches.  Patient has a history of prediabetes, anemia, and morbid obesity.  Patient describes as pleuritic with history of intermittent episodes where she develops spasms throughout her body.  This can occur in her arms, legs, back, or abdomen.  She started out having this once every few days, but seems to be occurring in an accelerating pattern.  This evening, she developed cramping "all over".  She denies to me she is having any fevers or chills.  She denies any nausea, vomiting, or diarrhea.  She denies any new medications or changes in her diet.  The history is provided by the patient.       Home Medications Prior to Admission medications   Medication Sig Start Date End Date Taking? Authorizing Provider  benzonatate (TESSALON) 100 MG capsule Take 1 capsule (100 mg total) by mouth every 8 (eight) hours. 01/23/22   Carlisle Beers, FNP  diclofenac Sodium (VOLTAREN) 1 % GEL Apply 4 g topically 4 (four) times daily. Apply to affected areas 4 times daily as needed for pain. 11/28/21   Theadora Rama Scales, PA-C  ferrous sulfate 325 (65 FE) MG tablet Take 2 tablets (650 mg total) by mouth every other day. 09/07/21   Brock Bad, MD  ibuprofen (ADVIL) 800 MG tablet Take 1 tablet (800 mg total) by mouth every 8 (eight) hours as needed. 09/07/21   Brock Bad, MD  meloxicam (MOBIC) 15 MG tablet Take 1 tablet (15 mg total) by mouth daily. 02/01/22   Hyatt, Max T, DPM  methylPREDNISolone (MEDROL DOSEPAK) 4 MG TBPK tablet 6 day dose pack - take as directed 02/01/22   Hyatt, Max T, DPM  phentermine 15 MG capsule Take 1 capsule (15 mg total) by mouth every morning. 10/06/21   Christen Butter, NP  tamsulosin (FLOMAX) 0.4 MG CAPS capsule Take 1 capsule (0.4 mg total) by mouth daily. 10/06/21   Christen Butter, NP  urea (CARMOL) 40 % CREA Apply 1 Application topically 2 (two) times daily. Apply to heels twice daily 12/14/21   Delories Heinz, DPM      Allergies    Penicillin g, Penicillins, and Tramadol hcl    Review of Systems   Review of Systems  All other systems reviewed and are negative.   Physical Exam Updated Vital Signs BP (!) 143/88 (BP Location: Right Wrist)   Pulse 79   Temp 97.8 F (36.6 C) (Oral)   Resp 18   LMP 12/29/2021   SpO2 100%  Physical Exam Vitals and nursing note reviewed.  Constitutional:      General: She is not in acute distress.    Appearance: She is well-developed. She is not diaphoretic.  HENT:     Head: Normocephalic and atraumatic.  Cardiovascular:     Rate and Rhythm: Normal rate and regular rhythm.     Heart sounds: No murmur heard.    No friction rub. No gallop.  Pulmonary:     Effort: Pulmonary effort is normal. No respiratory distress.     Breath sounds: Normal breath sounds. No wheezing.  Abdominal:     General: Bowel sounds are normal. There is  no distension.     Palpations: Abdomen is soft.     Tenderness: There is no abdominal tenderness.  Musculoskeletal:        General: Normal range of motion.     Cervical back: Normal range of motion and neck supple.  Skin:    General: Skin is warm and dry.  Neurological:     General: No focal deficit present.     Mental Status: She is alert and oriented to person, place, and time.     ED Results / Procedures / Treatments   Labs (all labs ordered are listed, but only abnormal results are displayed) Labs Reviewed  CBC WITH DIFFERENTIAL/PLATELET - Abnormal; Notable for the following components:      Result Value   WBC 11.5 (*)    RBC 5.13 (*)    MCH 25.1 (*)    Neutro Abs 8.3 (*)    All other components within normal limits  COMPREHENSIVE METABOLIC PANEL - Abnormal;  Notable for the following components:   Total Protein 8.2 (*)    AST 12 (*)    All other components within normal limits  SEDIMENTATION RATE  HCG, SERUM, QUALITATIVE  CK  TSH    EKG None  Radiology No results found.  Procedures Procedures    Medications Ordered in ED Medications - No data to display  ED Course/ Medical Decision Making/ A&P  Patient is a 89-year-old female presenting with complaints of muscle spasms throughout her body.  This has been occurring intermittently over the past 3 weeks and occurring with greater frequency.  She had a bad episode this evening, then called 911 and was transported here.  Patient arrives here with stable vital signs and appears clinically well.  She is afebrile and physical examination is unremarkable.  Work-up initiated in triage including CBC and basic metabolic panel.  These were both unremarkable.  Her electrolytes are essentially normal including potassium and calcium.  Total CK is normal.  I did order a sed rate and TSH, however these are pending and will likely not result until tomorrow.  At this point, nothing appears emergent.  I feel as though patient can safely be discharged with follow-up with her primary doctor if not improving.  Final Clinical Impression(s) / ED Diagnoses Final diagnoses:  None    Rx / DC Orders ED Discharge Orders     None         Geoffery Lyons, MD 02/14/22 [phone]

## 2022-02-14 NOTE — Discharge Instructions (Signed)
Drink plenty of fluids.  Follow-up with your primary doctor in the next few days, and return to the ER if symptoms significantly worsen or change.

## 2022-03-01 ENCOUNTER — Ambulatory Visit (HOSPITAL_COMMUNITY)
Admission: EM | Admit: 2022-03-01 | Discharge: 2022-03-01 | Disposition: A | Discharge status: Home / Self Care | Payer: Medicaid Other | Attending: Internal Medicine | Admitting: Internal Medicine

## 2022-03-01 ENCOUNTER — Encounter (HOSPITAL_COMMUNITY): Payer: Self-pay

## 2022-03-01 DIAGNOSIS — J111 Influenza due to unidentified influenza virus with other respiratory manifestations: Secondary | ICD-10-CM | POA: Diagnosis not present

## 2022-03-01 DIAGNOSIS — R509 Fever, unspecified: Secondary | ICD-10-CM | POA: Diagnosis not present

## 2022-03-01 DIAGNOSIS — J101 Influenza due to other identified influenza virus with other respiratory manifestations: Secondary | ICD-10-CM | POA: Diagnosis not present

## 2022-03-01 DIAGNOSIS — R051 Acute cough: Secondary | ICD-10-CM | POA: Insufficient documentation

## 2022-03-01 DIAGNOSIS — Z1152 Encounter for screening for COVID-19: Secondary | ICD-10-CM | POA: Insufficient documentation

## 2022-03-01 LAB — RESP PANEL BY RT-PCR (FLU A&B, COVID) ARPGX2
Influenza A by PCR: POSITIVE — AB
Influenza B by PCR: NEGATIVE
SARS Coronavirus 2 by RT PCR: NEGATIVE

## 2022-03-01 LAB — POC INFLUENZA A AND B ANTIGEN (URGENT CARE ONLY)
INFLUENZA A ANTIGEN, POC: NEGATIVE
INFLUENZA B ANTIGEN, POC: NEGATIVE

## 2022-03-01 MED ORDER — ACETAMINOPHEN 325 MG PO TABS
975.0000 mg | ORAL_TABLET | Freq: Once | ORAL | Status: AC
  Administered 2022-03-01: 975 mg via ORAL

## 2022-03-01 MED ORDER — ONDANSETRON HCL 4 MG/2ML IJ SOLN
INTRAMUSCULAR | Status: AC
  Filled 2022-03-01: qty 2

## 2022-03-01 MED ORDER — ACETAMINOPHEN 325 MG PO TABS
ORAL_TABLET | ORAL | Status: AC
  Filled 2022-03-01: qty 3

## 2022-03-01 MED ORDER — ONDANSETRON HCL 4 MG/2ML IJ SOLN
4.0000 mg | Freq: Once | INTRAMUSCULAR | Status: AC
  Administered 2022-03-01: 4 mg via INTRAMUSCULAR

## 2022-03-01 NOTE — ED Provider Notes (Signed)
[facility]    CSN: [id number] Arrival date & time: 03/01/22  1497      History   Chief Complaint Chief Complaint  Patient presents with   Generalized Body Aches   Fever   Cough    HPI [name] is a 41 y.o. female.   Patient presents to urgent care for evaluation of sore throat, nasal congestion, body aches, fever/chills, headache, cough, fatigue that started 2 days ago on February 27, 2022. Cough is productive with clear sputum and worse at nighttime.  Headache is generalized, denies vision changes and dizziness. Fever was 102 at the highest at home.  Nausea with a couple episodes of vomiting started this morning at around 2 AM.  She reports decreased appetite and has attempted to drink water after last episode of nonbloody/bilious emesis and has been able to keep this down but she remains nauseous. Denies shortness of breath, chest pain, abdominal pain, diarrhea, and eye drainage.  Her son was sick with influenza over the weekend a few days ago. Denies history of asthma or chronic respiratory problems.  Patient is not a smoker and denies drug use.Has been taking naproxen, ibuprofen, and aspirin in attempt to help with bodyaches and fever.  Has also taken Tylenol, last dose of Tylenol yesterday.  Last dose of naproxen and ibuprofen was this morning at approximately 5 AM.   Fever Associated symptoms: cough   Cough Associated symptoms: fever     Past Medical History:  Diagnosis Date   [redacted] weeks gestation of pregnancy    Advanced maternal age in multigravida 01/17/2015   Arthritis    "back; right foot; left ankle" (07/22/2014)   Cellulitis of leg, right 07/2014   Chronic lower back pain    History of COVID-19 06/21/2020   Morbid obesity with BMI of 50.0-59.9, adult (HCC)    Prediabetes    S/P cesarean section 07/20/2015   Vaginal delivery 1998    Patient Active Problem List   Diagnosis Date Noted   Chronic bilateral low back pain with right-sided sciatica  06/21/2020   Gastroenteritis 06/16/2015   Anemia 07/24/2014   Morbid obesity with body mass index of 60.0-69.9 in adult [facility]) 07/22/2014   Cellulitis of right lower extremity 07/22/2014   Prediabetes 07/22/2014   Fissure in skin of foot 07/22/2014   GERD without esophagitis 08/28/2012    Past Surgical History:  Procedure Laterality Date   CESAREAN SECTION  1994; 2010   CESAREAN SECTION N/A 07/20/2015   Procedure: REPEAT CESAREAN SECTION;  Surgeon: Brock Bad, MD;  Location: [facility];  Service: Obstetrics;  Laterality: N/A;   DILATION AND CURETTAGE OF UTERUS  ~ 2008   TUBAL LIGATION      OB History     Gravida  5   Para  4   Term  4   Preterm      AB  1   Living  4      SAB  1   IAB      Ectopic      Multiple  0   Live Births  4            Home Medications    Prior to Admission medications   Medication Sig Start Date End Date Taking? Authorizing Provider  benzonatate (TESSALON) 100 MG capsule Take 1 capsule (100 mg total) by mouth every 8 (eight) hours. 01/23/22   Carlisle Beers, FNP  diclofenac Sodium (VOLTAREN) 1 % GEL Apply 4 g  topically 4 (four) times daily. Apply to affected areas 4 times daily as needed for pain. 11/28/21   Theadora Rama Scales, PA-C  ferrous sulfate 325 (65 FE) MG tablet Take 2 tablets (650 mg total) by mouth every other day. 09/07/21   Brock Bad, MD  ibuprofen (ADVIL) 800 MG tablet Take 1 tablet (800 mg total) by mouth every 8 (eight) hours as needed. 09/07/21   Brock Bad, MD  meloxicam (MOBIC) 15 MG tablet Take 1 tablet (15 mg total) by mouth daily. 02/01/22   Hyatt, Max T, DPM  methylPREDNISolone (MEDROL DOSEPAK) 4 MG TBPK tablet 6 day dose pack - take as directed 02/01/22   Hyatt, Max T, DPM  phentermine 15 MG capsule Take 1 capsule (15 mg total) by mouth every morning. 10/06/21   Christen Butter, NP  tamsulosin (FLOMAX) 0.4 MG CAPS capsule Take 1 capsule (0.4 mg total) by mouth daily. 10/06/21   Christen Butter, NP   urea (CARMOL) 40 % CREA Apply 1 Application topically 2 (two) times daily. Apply to heels twice daily 12/14/21   Delories Heinz, DPM    Family History Family History  Problem Relation Age of Onset   Hypertension Mother    Diabetes Father    Prostate cancer Father    Diabetes Maternal Grandfather    Heart attack Other     Social History Social History   Tobacco Use   Smoking status: Never   Smokeless tobacco: Never  Vaping Use   Vaping Use: Never used  Substance Use Topics   Alcohol use: Not Currently    Alcohol/week: 0.0 standard drinks of alcohol   Drug use: No     Allergies   Penicillin g, Penicillins, and Tramadol hcl   Review of Systems Review of Systems  Constitutional:  Positive for fever.  Respiratory:  Positive for cough.   Per HPI   Physical Exam Triage Vital Signs ED Triage Vitals [03/01/22 0902]  Enc Vitals Group     BP 131/84     Pulse Rate 82     Resp 18     Temp 99.1 F (37.3 C)     Temp Source Oral     SpO2 99 %     Weight      Height      Head Circumference      Peak Flow      Pain Score      Pain Loc      Pain Edu?      Excl. in GC?    No data found.  Updated Vital Signs BP 131/84 (BP Location: Left Arm)   Pulse 82   Temp 99.1 F (37.3 C) (Oral)   Resp 18   LMP 03/01/2022   SpO2 99%   Visual Acuity Right Eye Distance:   Left Eye Distance:   Bilateral Distance:    Right Eye Near:   Left Eye Near:    Bilateral Near:     Physical Exam Vitals and nursing note reviewed.  Constitutional:      Appearance: She is obese. She is ill-appearing. She is not toxic-appearing.     Comments: Tearful due to body aches and discomfort.  HENT:     Head: Normocephalic and atraumatic.     Right Ear: Hearing, tympanic membrane, ear canal and external ear normal.     Left Ear: Hearing, tympanic membrane, ear canal and external ear normal.     Nose: Congestion and rhinorrhea present.  Right Turbinates: Swollen.     Left  Turbinates: Swollen.     Mouth/Throat:     Lips: Pink.     Mouth: Mucous membranes are moist.     Pharynx: No posterior oropharyngeal erythema.     Comments: Small amount of clear postnasal drainage visualized to the posterior oropharynx.  Eyes:     General: Lids are normal. Vision grossly intact. Gaze aligned appropriately.        Right eye: No discharge.        Left eye: No discharge.     Extraocular Movements: Extraocular movements intact.     Conjunctiva/sclera: Conjunctivae normal.     Pupils: Pupils are equal, round, and reactive to light.  Cardiovascular:     Rate and Rhythm: Normal rate and regular rhythm.     Heart sounds: Normal heart sounds, S1 normal and S2 normal.  Pulmonary:     Effort: Pulmonary effort is normal. No respiratory distress.     Breath sounds: Normal breath sounds and air entry.     Comments: No adventitious sounds heard throughout.  No respiratory distress. Abdominal:     Palpations: Abdomen is soft.  Musculoskeletal:     Cervical back: Neck supple.     Right lower leg: No edema.     Left lower leg: No edema.  Lymphadenopathy:     Cervical: No cervical adenopathy.  Skin:    General: Skin is warm and dry.     Capillary Refill: Capillary refill takes less than 2 seconds.     Findings: No rash.  Neurological:     General: No focal deficit present.     Mental Status: She is alert and oriented to person, place, and time. Mental status is at baseline.     Cranial Nerves: No dysarthria or facial asymmetry.     Motor: No weakness.     Gait: Gait normal.  Psychiatric:        Mood and Affect: Mood normal.        Speech: Speech normal.        Behavior: Behavior normal.        Thought Content: Thought content normal.        Judgment: Judgment normal.      UC Treatments / Results  Labs (all labs ordered are listed, but only abnormal results are displayed) Labs Reviewed  RESP PANEL BY RT-PCR (FLU A&B, COVID) ARPGX2  POC INFLUENZA A AND B ANTIGEN  (URGENT CARE ONLY)    EKG   Radiology No results found.  Procedures Procedures (including critical care time)  Medications Ordered in UC Medications  acetaminophen (TYLENOL) tablet 975 mg (has no administration in time range)  ondansetron (ZOFRAN) injection 4 mg (4 mg Intramuscular Given 03/01/22 0955)    Initial Impression / Assessment and Plan / UC Course  I have reviewed the triage vital signs and the nursing notes.  Pertinent labs & imaging results that were available during my care of the patient were reviewed by me and considered in my medical decision making (see chart for details).  Influenza-like illness Symptoms and physical exam consistent with a viral upper respiratory tract infection that will likely resolve with rest, fluids, and prescriptions for symptomatic relief. No indication for imaging today based on stable cardiopulmonary exam and hemodynamically stable vital signs.   Influenza testing is negative in clinic.  PCR COVID-19 and flu testing is pending.  We will call patient if this is positive.  Quarantine guidelines discussed. Currently on  day 3 of symptoms and does qualify for antiviral therapy.   Patient given Tylenol and 4 mg Zofran IM in clinic today for generalized body pain and nausea.  Zofran 4 mg ODT every 8 hours as needed for nausea and vomiting and Tessalon Perles every 8 hours as needed for cough sent to pharmacy for symptomatic relief to be taken as prescribed.   May purchase guaifenesin over-the-counter (Mucinex) and take this every 12 hours to help with nasal congestion and cough.  May use ibuprofen/tylenol over the counter for body aches, fever/chills, and overall discomfort associated with viral illness. Nonpharmacologic interventions for symptom relief provided and after visit summary below.   Strict ED/urgent care return precautions given.  Patient verbalizes understanding and agreement with plan.  Counseled patient regarding possible side  effects and uses of all medications prescribed at today's visit.  Patient verbalizes understanding and agreement with plan.  All questions answered.  Patient discharged from urgent care in stable condition.       Final Clinical Impressions(s) / UC Diagnoses   Final diagnoses:  Influenza-like illness  Fever, unspecified  Acute cough     Discharge Instructions      You have a viral upper respiratory infection.  In clinic flu testing is negative. COVID-19 and flu PCR testing is pending. We will call you with results if positive. If your COVID test is positive, you must stay at home until day 6 of symptoms. On day 6, you may go out into public and go back to work, but you must wear a mask until day 11 of symptoms to prevent spread to others.  Take guaifenesin 1200mg   every 12 hours to thin your mucous so that you can get it out of your body easier with coughing/blowing your nose. Drink plenty of water while taking this medication so that it works well in your body (at least 8 cups a day).   Take tessalon pearles every 8 hours as needed for cough.  You may take Zofran every 8 hours as needed for nausea and vomiting.  I encourage you to drink a lot of fluid including water and Pedialyte to stay well-hydrated.  You may take tylenol 1,000mg  and ibuprofen 600mg  every 6 hours with food as needed for fever/chills, sore throat, aches/pains, and inflammation associated with viral illness. Take this with food to avoid stomach upset.    You may do salt water and baking soda gargles every 4 hours as needed for your throat pain.  Please put 1 teaspoon of salt and 1/2 teaspoon of baking soda in 8 ounces of warm water then gargle and spit the water out. You may also put 1 tablespoon of honey in warm water and drink this to soothe your throat.  Place a humidifier in your room at night to help decrease dry air that can irritate your airway and cause you to have a sore throat and cough.  Please try to eat a  well-balanced diet while you are sick so that your body gets proper nutrition to heal.  If you develop any new or worsening symptoms, please return.  If your symptoms are severe, please go to the emergency room.  Follow-up with your primary care provider for further evaluation and management of your symptoms as well as ongoing wellness visits.  I hope you feel better!      ED Prescriptions   None    PDMP not reviewed this encounter.   Carlisle BeersStanhope, Riva Sesma M, FNP 03/01/22 1011

## 2022-03-01 NOTE — ED Triage Notes (Addendum)
Pt reports cough and congestion x 2 days.  Pt reports fever and body aches x 2 days. Pt is taking Advil to help with her symptoms.

## 2022-03-01 NOTE — Discharge Instructions (Signed)
You have a viral upper respiratory infection.  In clinic flu testing is negative. COVID-19 and flu PCR testing is pending. We will call you with results if positive. If your COVID test is positive, you must stay at home until day 6 of symptoms. On day 6, you may go out into public and go back to work, but you must wear a mask until day 11 of symptoms to prevent spread to others.  Take guaifenesin 1200mg   every 12 hours to thin your mucous so that you can get it out of your body easier with coughing/blowing your nose. Drink plenty of water while taking this medication so that it works well in your body (at least 8 cups a day).   Take tessalon pearles every 8 hours as needed for cough.  You may take Zofran every 8 hours as needed for nausea and vomiting.  I encourage you to drink a lot of fluid including water and Pedialyte to stay well-hydrated.  You may take tylenol 1,000mg  and ibuprofen 600mg  every 6 hours with food as needed for fever/chills, sore throat, aches/pains, and inflammation associated with viral illness. Take this with food to avoid stomach upset.    You may do salt water and baking soda gargles every 4 hours as needed for your throat pain.  Please put 1 teaspoon of salt and 1/2 teaspoon of baking soda in 8 ounces of warm water then gargle and spit the water out. You may also put 1 tablespoon of honey in warm water and drink this to soothe your throat.  Place a humidifier in your room at night to help decrease dry air that can irritate your airway and cause you to have a sore throat and cough.  Please try to eat a well-balanced diet while you are sick so that your body gets proper nutrition to heal.  If you develop any new or worsening symptoms, please return.  If your symptoms are severe, please go to the emergency room.  Follow-up with your primary care provider for further evaluation and management of your symptoms as well as ongoing wellness visits.  I hope you feel better!

## 2022-03-08 ENCOUNTER — Ambulatory Visit: Payer: Medicaid Other | Admitting: Podiatry

## 2022-05-04 ENCOUNTER — Telehealth: Payer: Medicaid Other | Admitting: Nurse Practitioner

## 2022-05-04 DIAGNOSIS — N3000 Acute cystitis without hematuria: Secondary | ICD-10-CM

## 2022-05-04 MED ORDER — NITROFURANTOIN MONOHYD MACRO 100 MG PO CAPS: 100.0000 mg | ORAL_CAPSULE | Freq: Two times a day (BID) | ORAL | 0 refills | Status: AC

## 2022-05-04 NOTE — Progress Notes (Signed)
Virtual Visit Consent   [name], you are scheduled for a virtual visit with a [facility] provider today. Just as with appointments in the office, your consent must be obtained to participate. Your consent will be active for this visit and any virtual visit you may have with one of our providers in the next 365 days. If you have a MyChart account, a copy of this consent can be sent to you electronically.  As this is a virtual visit, video technology does not allow for your provider to perform a traditional examination. This may limit your provider's ability to fully assess your condition. If your provider identifies any concerns that need to be evaluated in person or the need to arrange testing (such as labs, EKG, etc.), we will make arrangements to do so. Although advances in technology are sophisticated, we cannot ensure that it will always work on either your end or our end. If the connection with a video visit is poor, the visit may have to be switched to a telephone visit. With either a video or telephone visit, we are not always able to ensure that we have a secure connection.  By engaging in this virtual visit, you consent to the provision of healthcare and authorize for your insurance to be billed (if applicable) for the services provided during this visit. Depending on your insurance coverage, you may receive a charge related to this service.  I need to obtain your verbal consent now. Are you willing to proceed with your visit today? [name] has provided verbal consent on 05/04/2022 for a virtual visit (video or telephone). Apolonio Schneiders, FNP  Date: 05/04/2022 5:56 PM  Virtual Visit via Video Note   I, Apolonio Schneiders, connected with  [name]  ([id number], 1980/10/26) on 05/04/22 at  6:30 PM EST by a video-enabled telemedicine application and verified that I am speaking with the correct person using two identifiers.  Location: Patient: Virtual Visit Location Patient:  Home Provider: Virtual Visit Location Provider: Home Office   I discussed the limitations of evaluation and management by telemedicine and the availability of in person appointments. The patient expressed understanding and agreed to proceed.    History of Present Illness: [name] is a 42 y.o. who identifies as a female who was assigned female at birth, and is being seen today for pain and burning with urination. She has increased frequency in urination.   Her symptoms started slowly over the past week and have been intermittent.   She denies fever, N/V or abdominal pain   She does have a history of back pain chronically that has been bothering her more recently as well   No recent UTI  Most recent culture 2016  Denies a history of kidney disease or decreased function   Problems:  Patient Active Problem List   Diagnosis Date Noted   Chronic bilateral low back pain with right-sided sciatica 06/21/2020   Gastroenteritis 06/16/2015   Anemia 07/24/2014   Morbid obesity with body mass index of 60.0-69.9 in adult [facility]) 07/22/2014   Cellulitis of right lower extremity 07/22/2014   Prediabetes 07/22/2014   Fissure in skin of foot 07/22/2014   GERD without esophagitis 08/28/2012    Allergies:  Allergies  Allergen Reactions   Penicillin G Rash   Penicillins Hives   Tramadol Hcl Other (See Comments)    hallucinations   Medications:  Current Outpatient Medications:    benzonatate (TESSALON) 100 MG capsule, Take 1 capsule (100  mg total) by mouth every 8 (eight) hours., Disp: 21 capsule, Rfl: 0   diclofenac Sodium (VOLTAREN) 1 % GEL, Apply 4 g topically 4 (four) times daily. Apply to affected areas 4 times daily as needed for pain., Disp: 100 g, Rfl: 2   ferrous sulfate 325 (65 FE) MG tablet, Take 2 tablets (650 mg total) by mouth every other day., Disp: 60 tablet, Rfl: 5   ibuprofen (ADVIL) 800 MG tablet, Take 1 tablet (800 mg total) by mouth every 8 (eight) hours as needed.,  Disp: 30 tablet, Rfl: 5   meloxicam (MOBIC) 15 MG tablet, Take 1 tablet (15 mg total) by mouth daily., Disp: 30 tablet, Rfl: 3   methylPREDNISolone (MEDROL DOSEPAK) 4 MG TBPK tablet, 6 day dose pack - take as directed, Disp: 21 tablet, Rfl: 0   phentermine 15 MG capsule, Take 1 capsule (15 mg total) by mouth every morning., Disp: 30 capsule, Rfl: 0   tamsulosin (FLOMAX) 0.4 MG CAPS capsule, Take 1 capsule (0.4 mg total) by mouth daily., Disp: 30 capsule, Rfl: 3   urea (CARMOL) 40 % CREA, Apply 1 Application topically 2 (two) times daily. Apply to heels twice daily, Disp: 120 g, Rfl: 3  Observations/Objective: Patient is well-developed, well-nourished in no acute distress.  Resting comfortably  at home.  Head is normocephalic, atraumatic.  No labored breathing.  Speech is clear and coherent with logical content.  Patient is alert and oriented at baseline.    Assessment and Plan: 1. Acute cystitis without hematuria  Push fluids  Avoid alcohol, sugar, and caffeine    - nitrofurantoin, macrocrystal-monohydrate, (MACROBID) 100 MG capsule; Take 1 capsule (100 mg total) by mouth 2 (two) times daily for 5 days.  Dispense: 10 capsule; Refill: 0     Follow Up Instructions: I discussed the assessment and treatment plan with the patient. The patient was provided an opportunity to ask questions and all were answered. The patient agreed with the plan and demonstrated an understanding of the instructions.  A copy of instructions were sent to the patient via MyChart unless otherwise noted below.    The patient was advised to call back or seek an in-person evaluation if the symptoms worsen or if the condition fails to improve as anticipated.  Time:  I spent 10 minutes with the patient via telehealth technology discussing the above problems/concerns.    Apolonio Schneiders, FNP

## 2022-06-14 ENCOUNTER — Ambulatory Visit: Payer: Medicaid Other | Admitting: Obstetrics

## 2022-06-14 ENCOUNTER — Encounter: Payer: Self-pay | Admitting: Medical-Surgical

## 2022-06-14 ENCOUNTER — Telehealth (INDEPENDENT_AMBULATORY_CARE_PROVIDER_SITE_OTHER): Payer: Medicaid Other | Admitting: Medical-Surgical

## 2022-06-14 DIAGNOSIS — M545 Low back pain, unspecified: Secondary | ICD-10-CM | POA: Diagnosis not present

## 2022-06-14 DIAGNOSIS — R300 Dysuria: Secondary | ICD-10-CM

## 2022-06-14 LAB — POCT URINALYSIS DIP (CLINITEK)
Bilirubin, UA: NEGATIVE
Glucose, UA: NEGATIVE mg/dL
Ketones, POC UA: NEGATIVE mg/dL
Leukocytes, UA: NEGATIVE
Nitrite, UA: NEGATIVE
POC PROTEIN,UA: 30 — AB
Spec Grav, UA: >=1.03 — AB (ref 1.010–1.025)
Urobilinogen, UA: 1 E.U./dL
pH, UA: 7 (ref 5.0–8.0)

## 2022-06-14 NOTE — Progress Notes (Signed)
Virtual Visit via Video Note  I connected with [name] on 06/14/22 at 10:30 AM EDT by a video enabled telemedicine application and verified that I am speaking with the correct person using two identifiers.   I discussed the limitations of evaluation and management by telemedicine and the availability of in person appointments. The patient expressed understanding and agreed to proceed.  Patient location: home Provider locations: office  Subjective:    CC: Back pain, dysuria  HPI: Pleasant 42 year old female presenting today with reports of severe low back pain along with burning and frequency with urination.  She was seen virtually in February via urgent care and prescribed Macrobid.  She completed the 5-day course of this however did not see an improvement in her symptoms.  Reports that the back pain is low along the lumbar spine and pelvis.  Continues to have burning with urination.   Past medical history, Surgical history, Family history not pertinant except as noted below, Social history, Allergies, and medications have been entered into the medical record, reviewed, and corrections made.   Review of Systems: See HPI for pertinent positives and negatives.   Objective:    General: Speaking clearly in complete sentences without any shortness of breath.  Alert and oriented x3.  Normal judgment. No apparent acute distress.  Impression and Recommendations:    1. Dysuria Unable to fully evaluate virtually.  Having patient come in to complete a POCT urinalysis.  Results show a large amount of blood as well as elevated specific gravity and small amount of protein.  Negative for nitrites and leukocytes.  Sending for culture.  Wet prep stat.  Also sending urine for GC chlamydia. - C. trachomatis/N. gonorrhoeae RNA - POCT URINALYSIS DIP (CLINITEK) - WET PREP FOR TRICH, YEAST, CLUE - Urine Culture  2. Acute bilateral low back pain without sciatica Unclear etiology.  With described  location, suspect that this is more of a lumbar spine etiology rather than associated with dysuria.  If urinary workup comes back negative, we may need to look at doing an anti-inflammatory as well as a muscle relaxer to help treat this.  I discussed the assessment and treatment plan with the patient. The patient was provided an opportunity to ask questions and all were answered. The patient agreed with the plan and demonstrated an understanding of the instructions.   The patient was advised to call back or seek an in-person evaluation if the symptoms worsen or if the condition fails to improve as anticipated.  25 minutes of non-face-to-face time was provided during this encounter.  Return if symptoms worsen or fail to improve.  Clearnce Sorrel, DNP, APRN, FNP-BC [facility]

## 2022-06-15 LAB — WET PREP FOR TRICH, YEAST, CLUE
MICRO NUMBER:: 14692863
Specimen Quality: ADEQUATE

## 2022-06-15 LAB — URINE CULTURE
MICRO NUMBER:: 14693238
SPECIMEN QUALITY:: ADEQUATE

## 2022-06-15 NOTE — Telephone Encounter (Signed)
Attempted call to patient. Phone rang and then went to busy signal.

## 2022-06-16 LAB — C. TRACHOMATIS/N. GONORRHOEAE RNA
C. trachomatis RNA, TMA: NOT DETECTED
N. gonorrhoeae RNA, TMA: NOT DETECTED

## 2022-06-18 NOTE — Telephone Encounter (Signed)
Called patient and left detailed message on home # ( permission given in DPR to do this)  Requesting  a return call to schedule an in person visit with either Samuel Bouche, NP or Dr. Darene Lamer.

## 2022-06-20 ENCOUNTER — Encounter: Payer: Self-pay | Admitting: Medical-Surgical

## 2022-06-20 NOTE — Telephone Encounter (Signed)
Patient seen as virtual visit today.

## 2022-06-21 ENCOUNTER — Encounter: Payer: Self-pay | Admitting: Medical-Surgical

## 2022-06-26 ENCOUNTER — Ambulatory Visit: Payer: Medicaid Other | Admitting: Obstetrics

## 2022-06-27 ENCOUNTER — Ambulatory Visit: Payer: Medicaid Other | Admitting: Medical-Surgical

## 2022-06-27 ENCOUNTER — Encounter: Payer: Self-pay | Admitting: Medical-Surgical

## 2022-06-27 ENCOUNTER — Ambulatory Visit (INDEPENDENT_AMBULATORY_CARE_PROVIDER_SITE_OTHER): Payer: Medicaid Other

## 2022-06-27 VITALS — BP 138/76 | HR 84 | Resp 20 | Ht 63.0 in | Wt 369.8 lb

## 2022-06-27 DIAGNOSIS — R319 Hematuria, unspecified: Secondary | ICD-10-CM | POA: Diagnosis not present

## 2022-06-27 DIAGNOSIS — Z6841 Body Mass Index (BMI) 40.0 and over, adult: Secondary | ICD-10-CM

## 2022-06-27 DIAGNOSIS — G8929 Other chronic pain: Secondary | ICD-10-CM

## 2022-06-27 DIAGNOSIS — M5441 Lumbago with sciatica, right side: Secondary | ICD-10-CM | POA: Diagnosis not present

## 2022-06-27 DIAGNOSIS — R109 Unspecified abdominal pain: Secondary | ICD-10-CM | POA: Diagnosis not present

## 2022-06-27 DIAGNOSIS — R7303 Prediabetes: Secondary | ICD-10-CM | POA: Diagnosis not present

## 2022-06-27 DIAGNOSIS — N946 Dysmenorrhea, unspecified: Secondary | ICD-10-CM | POA: Diagnosis not present

## 2022-06-27 DIAGNOSIS — J329 Chronic sinusitis, unspecified: Secondary | ICD-10-CM

## 2022-06-27 DIAGNOSIS — J40 Bronchitis, not specified as acute or chronic: Secondary | ICD-10-CM

## 2022-06-27 DIAGNOSIS — M5442 Lumbago with sciatica, left side: Secondary | ICD-10-CM

## 2022-06-27 DIAGNOSIS — M545 Low back pain, unspecified: Secondary | ICD-10-CM | POA: Diagnosis not present

## 2022-06-27 DIAGNOSIS — M549 Dorsalgia, unspecified: Secondary | ICD-10-CM | POA: Diagnosis not present

## 2022-06-27 LAB — POCT URINALYSIS DIP (CLINITEK)
Bilirubin, UA: NEGATIVE
Blood, UA: NEGATIVE
Glucose, UA: NEGATIVE mg/dL
Ketones, POC UA: NEGATIVE mg/dL
Nitrite, UA: NEGATIVE
POC PROTEIN,UA: NEGATIVE
Spec Grav, UA: >=1.03 — AB (ref 1.010–1.025)
Urobilinogen, UA: 0.2 E.U./dL
pH, UA: 5.5 (ref 5.0–8.0)

## 2022-06-27 LAB — POCT GLYCOSYLATED HEMOGLOBIN (HGB A1C)
HbA1c, POC (prediabetic range): 5.7 % (ref 5.7–6.4)
Hemoglobin A1C: 5.7 % — AB (ref 4.0–5.6)

## 2022-06-27 MED ORDER — AZITHROMYCIN 250 MG PO TABS: ORAL_TABLET | ORAL | 0 refills | Status: AC

## 2022-06-27 MED ORDER — ALBUTEROL SULFATE HFA 108 (90 BASE) MCG/ACT IN AERS: 2.0000 | INHALATION_SPRAY | Freq: Four times a day (QID) | RESPIRATORY_TRACT | 1 refills | Status: DC | PRN

## 2022-06-27 MED ORDER — DEXAMETHASONE SODIUM PHOSPHATE 10 MG/ML IJ SOLN
10.0000 mg | Freq: Once | INTRAMUSCULAR | Status: AC
  Administered 2022-06-27: 10 mg via INTRAMUSCULAR

## 2022-06-27 MED ORDER — IBUPROFEN 800 MG PO TABS: 800.0000 mg | ORAL_TABLET | Freq: Three times a day (TID) | ORAL | 5 refills | Status: DC | PRN

## 2022-06-27 MED ORDER — PREDNISONE 50 MG PO TABS: 50.0000 mg | ORAL_TABLET | Freq: Every day | ORAL | 0 refills | Status: DC

## 2022-06-27 NOTE — Assessment & Plan Note (Signed)
Previous urinalysis positive for moderate blood.  In the setting of urinary burning and back pain, it was determined that she needed an in person evaluation for further investigation.  Repeat POCT UA negative for blood however specific gravity remains high and there are small amount of leukocytes.  We have sent this for culture for further investigation.  She continues to have burning however feel this may be related to dehydration.  Advised to increase water intake throughout the day to help flush her system and see if that benefits her urinary symptoms.

## 2022-06-27 NOTE — Assessment & Plan Note (Signed)
Unfortunately, her weight is certainly a factor in her back pain issues and overall health.  She is interested in weight loss medications that may help however her insurance coverage is a problem.  Her blood pressure is a little bit on the upper side today so would like to avoid adding phentermine at this point.  Recommend she contact her insurance regarding what medications may be covered for weight loss since there have been formulary changes this year.  After discussion of options, feel that she would be a good candidate for healthy weight and wellness.  Patient is agreeable so referral placed.

## 2022-06-27 NOTE — Assessment & Plan Note (Signed)
Last A1c 6.0% 2 years ago. Today, down to 5.7%, still prediabetic. Discussed recommendations for weight management, regular intentional exercise, and a low carb heart healthy diet.

## 2022-06-27 NOTE — Assessment & Plan Note (Addendum)
Prior history of low back pain, now severe and interfering with ADLs. Pain is bilateral, left worse than right, radiating up to the lower left ribs. No tenderness to palpation or edema noted. + for bilateral sciatica but no red flag symptoms. Limited exam due to body habitus. No current exercise. Meloxicam and Ibuprofen unhelpful. Bending over/leaning helps. Getting lumbar spine x-rays today. Decadron 10mg  IM x 1 today. Start Prednisone 50mg  daily x 5 days tomorrow then resume anti-inflammatories. Discussed other conservative measures for pain management.

## 2022-06-27 NOTE — Progress Notes (Signed)
        Established patient visit  History, exam, impression, and plan:  Prediabetes Last A1c 6.0% 2 years ago. Today, down to 5.7%, still prediabetic. Discussed recommendations for weight management, regular intentional exercise, and a low carb heart healthy diet.   Morbid obesity with body mass index of 60.0-69.9 in adult ([place]) BMI 65.51 with weight related co-morbidities. Interested in weight loss medications. Avoiding phentermine due to BP. Advised to contact insurance to find out what's covered. Referring to healthy weight and wellness.   Hematuria Moderate blood on prior UA resolved today but burning continues. Specific gravity still elevated and + for small leukocytes. Sending for culture. Prior hematuria with back pain concerning for potential calculi. Getting KUB.  Suspect dehydration causing burning. Recommend increasing water intake to no less than 64 ounces daily.   Chronic bilateral low back pain with right-sided sciatica Prior history of low back pain, now severe and interfering with ADLs. Pain is bilateral, left worse than right, radiating up to the lower left ribs. No tenderness to palpation or edema noted. + for bilateral sciatica but no red flag symptoms. Limited exam due to body habitus. No current exercise. Meloxicam and Ibuprofen unhelpful. Bending over/leaning helps. Getting lumbar spine x-rays today. Decadron 10mg  IM x 1 today. Start Prednisone 50mg  daily x 5 days tomorrow then resume anti-inflammatories. Discussed other conservative measures for pain management.    Sinobronchitis Last week, noted increased sinus congestion for several days. After several days, developed significant chest congestion. Cough productive of thick green mucus. Has been short of breath and wheezing. On exam, respirations normal, unlabored. Diffuse inspiratory and expiratory wheezes noted in all lung fields. Adding Azithromycin and an Albuterol inhaler.     Procedures performed this  visit: None.  Return if symptoms worsen or fail to improve.  __________________________________ Clearnce Sorrel, DNP, APRN, FNP-BC [facility] [place]

## 2022-06-28 DIAGNOSIS — J329 Chronic sinusitis, unspecified: Secondary | ICD-10-CM | POA: Insufficient documentation

## 2022-06-28 LAB — URINE CULTURE
MICRO NUMBER:: 14749272
SPECIMEN QUALITY:: ADEQUATE

## 2022-06-28 NOTE — Assessment & Plan Note (Signed)
Last week, noted increased sinus congestion for several days. After several days, developed significant chest congestion. Cough productive of thick green mucus. Has been short of breath and wheezing. On exam, respirations normal, unlabored. Diffuse inspiratory and expiratory wheezes noted in all lung fields. Adding Azithromycin and an Albuterol inhaler.

## 2022-11-02 ENCOUNTER — Encounter (HOSPITAL_COMMUNITY): Payer: Self-pay

## 2022-11-02 ENCOUNTER — Ambulatory Visit (INDEPENDENT_AMBULATORY_CARE_PROVIDER_SITE_OTHER): Payer: Medicaid Other

## 2022-11-02 ENCOUNTER — Ambulatory Visit (HOSPITAL_COMMUNITY)
Admission: EM | Admit: 2022-11-02 | Discharge: 2022-11-02 | Disposition: A | Discharge status: Home / Self Care | Payer: Medicaid Other | Attending: Emergency Medicine | Admitting: Emergency Medicine

## 2022-11-02 DIAGNOSIS — J069 Acute upper respiratory infection, unspecified: Secondary | ICD-10-CM

## 2022-11-02 DIAGNOSIS — B9689 Other specified bacterial agents as the cause of diseases classified elsewhere: Secondary | ICD-10-CM

## 2022-11-02 DIAGNOSIS — R0602 Shortness of breath: Secondary | ICD-10-CM | POA: Diagnosis not present

## 2022-11-02 DIAGNOSIS — R059 Cough, unspecified: Secondary | ICD-10-CM | POA: Diagnosis not present

## 2022-11-02 MED ORDER — PROMETHAZINE-DM 6.25-15 MG/5ML PO SYRP: 5.0000 mL | ORAL_SOLUTION | Freq: Four times a day (QID) | ORAL | 0 refills | Status: DC | PRN

## 2022-11-02 MED ORDER — PREDNISONE 20 MG PO TABS: 40.0000 mg | ORAL_TABLET | Freq: Every day | ORAL | 0 refills | Status: AC

## 2022-11-02 MED ORDER — AZITHROMYCIN 250 MG PO TABS: 250.0000 mg | ORAL_TABLET | Freq: Every day | ORAL | 0 refills | Status: DC

## 2022-11-02 NOTE — ED Provider Notes (Signed)
[facility]    CSN: [id number] Arrival date & time: 11/02/22  1735      History   Chief Complaint Chief Complaint  Patient presents with   Sore Throat    HPI [name] is a 42 y.o. female.   Patient presents to clinic with complaints of sore throat, productive cough, headache, wheezing, shortness of breath, body aches and chest congestion that have been evolving over the past week.  Initially symptoms started with nasal congestion, then moved down into her chest.    She did have a fever of 101 yesterday, has been taking Tylenol and ibuprofen.  Has also been taking Mucinex, TheraFlu and Sudafed.  Had pneumonia a few months back and had similar symptoms.  She does not smoke and has no history of asthma or chronic lung disease.    The history is provided by the patient and medical records.  Sore Throat Associated symptoms include headaches and shortness of breath.    Past Medical History:  Diagnosis Date   [redacted] weeks gestation of pregnancy    Advanced maternal age in multigravida 01/17/2015   Arthritis    "back; right foot; left ankle" (07/22/2014)   Cellulitis of leg, right 07/2014   Chronic lower back pain    History of COVID-19 06/21/2020   Morbid obesity with BMI of 50.0-59.9, adult ([facility])    Prediabetes    S/P cesarean section 07/20/2015   Vaginal delivery 1998    Patient Active Problem List   Diagnosis Date Noted   Sinobronchitis 06/28/2022   Hematuria 06/27/2022   Chronic bilateral low back pain with right-sided sciatica 06/21/2020   Gastroenteritis 06/16/2015   Anemia 07/24/2014   Morbid obesity with body mass index of 60.0-69.9 in adult [facility]) 07/22/2014   Prediabetes 07/22/2014   Fissure in skin of foot 07/22/2014   GERD without esophagitis 08/28/2012    Past Surgical History:  Procedure Laterality Date   CESAREAN SECTION  1994; 2010   CESAREAN SECTION N/A 07/20/2015   Procedure: REPEAT CESAREAN SECTION;  Surgeon: Brock Bad, MD;   Location: [facility];  Service: Obstetrics;  Laterality: N/A;   DILATION AND CURETTAGE OF UTERUS  ~ 2008   TUBAL LIGATION      OB History     Gravida  5   Para  4   Term  4   Preterm      AB  1   Living  4      SAB  1   IAB      Ectopic      Multiple  0   Live Births  4            Home Medications    Prior to Admission medications   Medication Sig Start Date End Date Taking? Authorizing Provider  azithromycin (ZITHROMAX) 250 MG tablet Take 1 tablet (250 mg total) by mouth daily. Take first 2 tablets together, then 1 every day until finished. 11/02/22  Yes Rinaldo Ratel, [place] N, FNP  predniSONE (DELTASONE) 20 MG tablet Take 2 tablets (40 mg total) by mouth daily for 5 days. 11/02/22 11/07/22 Yes Rinaldo Ratel, [place] N, FNP  promethazine-dextromethorphan (PROMETHAZINE-DM) 6.25-15 MG/5ML syrup Take 5 mLs by mouth 4 (four) times daily as needed for cough. 11/02/22  Yes Rinaldo Ratel, [place] N, FNP  albuterol (VENTOLIN HFA) 108 (90 Base) MCG/ACT inhaler Inhale 2 puffs into the lungs every 6 (six) hours as needed for wheezing. 06/27/22   Christen Butter, NP  diclofenac Sodium (VOLTAREN)  1 % GEL Apply 4 g topically 4 (four) times daily. Apply to affected areas 4 times daily as needed for pain. 11/28/21   Theadora Rama Scales, PA-C  ferrous sulfate 325 (65 FE) MG tablet Take 2 tablets (650 mg total) by mouth every other day. 09/07/21   Brock Bad, MD  ibuprofen (ADVIL) 800 MG tablet Take 1 tablet (800 mg total) by mouth every 8 (eight) hours as needed. 06/27/22   Christen Butter, NP  meloxicam (MOBIC) 15 MG tablet Take 1 tablet (15 mg total) by mouth daily. 02/01/22   Hyatt, Max T, DPM    Family History Family History  Problem Relation Age of Onset   Hypertension Mother    Diabetes Father    Prostate cancer Father    Diabetes Maternal Grandfather    Heart attack Other     Social History Social History   Tobacco Use   Smoking status: Never   Smokeless tobacco: Never  Vaping Use    Vaping status: Never Used  Substance Use Topics   Alcohol use: Not Currently    Alcohol/week: 0.0 standard drinks of alcohol   Drug use: No     Allergies   Penicillin g, Penicillins, and Tramadol hcl   Review of Systems Review of Systems  Constitutional:  Positive for fatigue and fever.  HENT:  Positive for congestion, postnasal drip, rhinorrhea and sore throat.   Respiratory:  Positive for cough and shortness of breath.   Neurological:  Positive for headaches.     Physical Exam Triage Vital Signs ED Triage Vitals  Encounter Vitals Group     BP 11/02/22 1847 (!) 142/89     Systolic BP Percentile --      Diastolic BP Percentile --      Pulse Rate 11/02/22 1847 83     Resp 11/02/22 1847 18     Temp 11/02/22 1847 99.3 F (37.4 C)     Temp Source 11/02/22 1847 Oral     SpO2 11/02/22 1847 99 %     Weight 11/02/22 1845 (!) 340 lb (154.2 kg)     Height 11/02/22 1845 5\' 3"  (1.6 m)     Head Circumference --      Peak Flow --      Pain Score 11/02/22 1845 6     Pain Loc --      Pain Education --      Exclude from Growth Chart --    No data found.  Updated Vital Signs BP (!) 142/89 (BP Location: Left Arm)   Pulse 83   Temp 99.3 F (37.4 C) (Oral)   Resp 18   Ht 5\' 3"  (1.6 m)   Wt (!) 340 lb (154.2 kg)   LMP 10/08/2022 (Exact Date)   SpO2 99%   BMI 60.23 kg/m   Visual Acuity Right Eye Distance:   Left Eye Distance:   Bilateral Distance:    Right Eye Near:   Left Eye Near:    Bilateral Near:     Physical Exam Vitals and nursing note reviewed.  Constitutional:      Appearance: Normal appearance. She is well-developed.  HENT:     Head: Normocephalic and atraumatic.     Right Ear: External ear normal.     Left Ear: External ear normal.     Nose: Congestion and rhinorrhea present.     Mouth/Throat:     Mouth: Mucous membranes are moist.     Pharynx: Posterior oropharyngeal erythema present.  Tonsils: No tonsillar exudate or tonsillar abscesses. 2+ on  the right. 2+ on the left.  Eyes:     Conjunctiva/sclera: Conjunctivae normal.  Cardiovascular:     Rate and Rhythm: Normal rate and regular rhythm.     Heart sounds: Normal heart sounds. No murmur heard. Pulmonary:     Effort: Pulmonary effort is normal. No respiratory distress.     Breath sounds: Normal breath sounds.  Musculoskeletal:        General: Normal range of motion.     Cervical back: Normal range of motion.  Skin:    General: Skin is warm and dry.  Neurological:     General: No focal deficit present.     Mental Status: She is alert and oriented to person, place, and time.  Psychiatric:        Mood and Affect: Mood normal.        Behavior: Behavior normal.      UC Treatments / Results  Labs (all labs ordered are listed, but only abnormal results are displayed) Labs Reviewed - No data to display  EKG   Radiology DG Chest 2 View  Result Date: 11/02/2022 CLINICAL DATA:  SOB, cough EXAM: CHEST - 2 VIEW COMPARISON:  Chest x-ray 02/11/2021 FINDINGS: The heart and mediastinal contours are within normal limits. No focal consolidation. No pulmonary edema. No pleural effusion. No pneumothorax. No acute osseous abnormality. IMPRESSION: No active cardiopulmonary disease. Electronically Signed   By: Tish Frederickson M.D.   On: 11/02/2022 19:27    Procedures Procedures (including critical care time)  Medications Ordered in UC Medications - No data to display  Initial Impression / Assessment and Plan / UC Course  I have reviewed the triage vital signs and the nursing notes.  Pertinent labs & imaging results that were available during my care of the patient were reviewed by me and considered in my medical decision making (see chart for details).  Vitals and triage reviewed, patient is hemodynamically stable.  Lungs are vesicular posteriorly, with somewhat diminished air movement in lower lobes, could be due to body habitus.  Does have a low-grade temp of 99.3.  Imaging  negative for pneumonia or acute infiltrate.  Due to duration and symptoms, will cover with azithromycin for bacterial upper respiratory infection.  Steroids given for cough, shortness of breath and inflammation.  Symptomatic management discussed.  Plan of care, follow-up care and return precautions given, no questions at this time.     Final Clinical Impressions(s) / UC Diagnoses   Final diagnoses:  Bacterial upper respiratory infection     Discharge Instructions      Your x-ray did not show any evidence of pneumonia.  I believe you do have a bacterial infection, I am starting you on azithromycin, please take all antibiotics as prescribed and until finished.  I am also starting you on steroids, start these tomorrow with breakfast.  You can take the cough syrup as needed, do not drink or drive on this medication as it may cause drowsiness.  Please return to clinic or seek immediate care if you develop high fever despite medication, worsening cough or shortness of breath despite 72 hours on antibiotics, or any new concerning symptoms.      ED Prescriptions     Medication Sig Dispense Auth. Provider   azithromycin (ZITHROMAX) 250 MG tablet Take 1 tablet (250 mg total) by mouth daily. Take first 2 tablets together, then 1 every day until finished. 6 tablet , [place] N, [place]  predniSONE (DELTASONE) 20 MG tablet Take 2 tablets (40 mg total) by mouth daily for 5 days. 10 tablet Rinaldo Ratel, [place] N, [place]   promethazine-dextromethorphan (PROMETHAZINE-DM) 6.25-15 MG/5ML syrup Take 5 mLs by mouth 4 (four) times daily as needed for cough. 118 mL , [place] N, [place]      PDMP not reviewed this encounter.   , [place] N, [place] 11/02/22 [phone]

## 2022-11-02 NOTE — ED Triage Notes (Signed)
Patient here today with c/o ST, productive cough, headache, wheeze, SOB, sinus pressure, and chest congestion X 1 week. Patient had Pneumonia a few months ago. She has been taking Mucinex and Sudafed with no relief. She works at a preschool. No recent travel.

## 2022-11-02 NOTE — Discharge Instructions (Addendum)
Your x-ray did not show any evidence of pneumonia.  I believe you do have a bacterial infection, I am starting you on azithromycin, please take all antibiotics as prescribed and until finished.  I am also starting you on steroids, start these tomorrow with breakfast.  You can take the cough syrup as needed, do not drink or drive on this medication as it may cause drowsiness.  Please return to clinic or seek immediate care if you develop high fever despite medication, worsening cough or shortness of breath despite 72 hours on antibiotics, or any new concerning symptoms.

## 2022-12-18 ENCOUNTER — Encounter (HOSPITAL_COMMUNITY): Payer: Self-pay | Admitting: Emergency Medicine

## 2022-12-18 ENCOUNTER — Other Ambulatory Visit: Payer: Self-pay

## 2022-12-18 ENCOUNTER — Ambulatory Visit (HOSPITAL_COMMUNITY)
Admission: EM | Admit: 2022-12-18 | Discharge: 2022-12-18 | Disposition: A | Discharge status: Home / Self Care | Payer: Medicaid Other | Attending: Emergency Medicine | Admitting: Emergency Medicine

## 2022-12-18 DIAGNOSIS — J029 Acute pharyngitis, unspecified: Secondary | ICD-10-CM | POA: Diagnosis not present

## 2022-12-18 DIAGNOSIS — J0110 Acute frontal sinusitis, unspecified: Secondary | ICD-10-CM | POA: Diagnosis not present

## 2022-12-18 MED ORDER — LIDOCAINE VISCOUS HCL 2 % MT SOLN: 15.0000 mL | OROMUCOSAL | 0 refills | Status: DC | PRN

## 2022-12-18 MED ORDER — DOXYCYCLINE HYCLATE 100 MG PO CAPS: 100.0000 mg | ORAL_CAPSULE | Freq: Two times a day (BID) | ORAL | 0 refills | Status: DC

## 2022-12-18 NOTE — Discharge Instructions (Signed)
Start taking doxycycline as prescribed for sinus infection. You can use lidocaine as needed for sore throat. Otherwise you can alternate between Tylenol and Ibuprofen for pain and fever. You can also use Flonase as needed for congestion.

## 2022-12-18 NOTE — ED Triage Notes (Signed)
Patient states she is been having sore throat and HA since yesterday.

## 2022-12-18 NOTE — ED Provider Notes (Signed)
[facility]    CSN: [id number] Arrival date & time: 12/18/22  0272      History   Chief Complaint Chief Complaint  Patient presents with   Headache   Sore Throat    HPI [name] is a 42 y.o. female.   Patient presents with sore throat, congestion, mild cough, and headache x 3 days.  Endorses pain with swallowing.  Denies shortness of breath, fever, abdominal pain, nausea, vomiting, and diarrhea.  Denies taking anything over-the-counter for symptoms.  Reports noticing thick yellow-green mucus.   Headache Associated symptoms: congestion, cough, sinus pressure and sore throat   Associated symptoms: no abdominal pain, no diarrhea, no dizziness, no ear pain, no fatigue, no fever, no nausea, no vomiting and no weakness   Sore Throat Associated symptoms include headaches. Pertinent negatives include no chest pain, no abdominal pain and no shortness of breath.    Past Medical History:  Diagnosis Date   [redacted] weeks gestation of pregnancy    Advanced maternal age in multigravida 01/17/2015   Arthritis    "back; right foot; left ankle" (07/22/2014)   Cellulitis of leg, right 07/2014   Chronic lower back pain    History of COVID-19 06/21/2020   Morbid obesity with BMI of 50.0-59.9, adult (HCC)    Prediabetes    S/P cesarean section 07/20/2015   Vaginal delivery 1998    Patient Active Problem List   Diagnosis Date Noted   Sinobronchitis 06/28/2022   Hematuria 06/27/2022   Chronic bilateral low back pain with right-sided sciatica 06/21/2020   Gastroenteritis 06/16/2015   Anemia 07/24/2014   Morbid obesity with body mass index of 60.0-69.9 in adult [facility] 07/22/2014   Prediabetes 07/22/2014   Fissure in skin of foot 07/22/2014   GERD without esophagitis 08/28/2012    Past Surgical History:  Procedure Laterality Date   CESAREAN SECTION  1994; 2010   CESAREAN SECTION N/A 07/20/2015   Procedure: REPEAT CESAREAN SECTION;  Surgeon: Brock Bad, MD;  Location: [facility];  Service: Obstetrics;  Laterality: N/A;   DILATION AND CURETTAGE OF UTERUS  ~ 2008   TUBAL LIGATION      OB History     Gravida  5   Para  4   Term  4   Preterm      AB  1   Living  4      SAB  1   IAB      Ectopic      Multiple  0   Live Births  4            Home Medications    Prior to Admission medications   Medication Sig Start Date End Date Taking? Authorizing Provider  doxycycline (VIBRAMYCIN) 100 MG capsule Take 1 capsule (100 mg total) by mouth 2 (two) times daily. 12/18/22  Yes Susann Givens, Debroh Sieloff A, NP  lidocaine (XYLOCAINE) 2 % solution Use as directed 15 mLs in the mouth or throat as needed for mouth pain. 12/18/22  Yes Susann Givens, Allayna Erlich A, NP  albuterol (VENTOLIN HFA) 108 (90 Base) MCG/ACT inhaler Inhale 2 puffs into the lungs every 6 (six) hours as needed for wheezing. 06/27/22   Christen Butter, NP  diclofenac Sodium (VOLTAREN) 1 % GEL Apply 4 g topically 4 (four) times daily. Apply to affected areas 4 times daily as needed for pain. 11/28/21   Theadora Rama Scales, PA-C  ferrous sulfate 325 (65 FE) MG tablet Take 2 tablets (650 mg total) by  mouth every other day. 09/07/21   Brock Bad, MD  ibuprofen (ADVIL) 800 MG tablet Take 1 tablet (800 mg total) by mouth every 8 (eight) hours as needed. 06/27/22   Christen Butter, NP  meloxicam (MOBIC) 15 MG tablet Take 1 tablet (15 mg total) by mouth daily. 02/01/22   Hyatt, Max T, DPM  promethazine-dextromethorphan (PROMETHAZINE-DM) 6.25-15 MG/5ML syrup Take 5 mLs by mouth 4 (four) times daily as needed for cough. 11/02/22   Garrison, [place] N, FNP    Family History Family History  Problem Relation Age of Onset   Hypertension Mother    Diabetes Father    Prostate cancer Father    Diabetes Maternal Grandfather    Heart attack Other     Social History Social History   Tobacco Use   Smoking status: Never   Smokeless tobacco: Never  Vaping Use   Vaping status: Never Used  Substance Use Topics   Alcohol  use: Not Currently    Alcohol/week: 0.0 standard drinks of alcohol   Drug use: No     Allergies   Penicillin g, Penicillins, and Tramadol hcl   Review of Systems Review of Systems  Constitutional:  Negative for chills, fatigue and fever.  HENT:  Positive for congestion, rhinorrhea, sinus pressure, sinus pain, sore throat and trouble swallowing. Negative for ear pain.   Respiratory:  Positive for cough. Negative for chest tightness, shortness of breath and wheezing.   Cardiovascular:  Negative for chest pain.  Gastrointestinal:  Negative for abdominal pain, diarrhea, nausea and vomiting.  Neurological:  Positive for headaches. Negative for dizziness, weakness and light-headedness.     Physical Exam Triage Vital Signs ED Triage Vitals  Encounter Vitals Group     BP 12/18/22 0847 135/60     Systolic BP Percentile --      Diastolic BP Percentile --      Pulse Rate 12/18/22 0847 72     Resp 12/18/22 0847 16     Temp 12/18/22 0847 98.2 F (36.8 C)     Temp Source 12/18/22 0847 Oral     SpO2 12/18/22 0847 98 %     Weight --      Height --      Head Circumference --      Peak Flow --      Pain Score 12/18/22 0848 7     Pain Loc --      Pain Education --      Exclude from Growth Chart --    No data found.  Updated Vital Signs BP 135/60 (BP Location: Right Arm)   Pulse 72   Temp 98.2 F (36.8 C) (Oral)   Resp 16   LMP 12/02/2022   SpO2 98%   Visual Acuity Right Eye Distance:   Left Eye Distance:   Bilateral Distance:    Right Eye Near:   Left Eye Near:    Bilateral Near:     Physical Exam Vitals and nursing note reviewed.  Constitutional:      General: She is awake. She is not in acute distress.    Appearance: Normal appearance. She is well-developed and well-groomed. She is not ill-appearing, toxic-appearing or diaphoretic.  HENT:     Head: Normocephalic.     Right Ear: Tympanic membrane and ear canal normal.     Left Ear: Tympanic membrane and ear canal  normal.     Nose: Congestion and rhinorrhea present.     Right Sinus: Maxillary sinus tenderness and  frontal sinus tenderness present.     Left Sinus: Maxillary sinus tenderness and frontal sinus tenderness present.     Mouth/Throat:     Mouth: Mucous membranes are moist.     Pharynx: Posterior oropharyngeal erythema and postnasal drip present. No pharyngeal swelling or oropharyngeal exudate.     Tonsils: No tonsillar exudate.  Eyes:     Extraocular Movements: Extraocular movements intact.     Pupils: Pupils are equal, round, and reactive to light.  Cardiovascular:     Rate and Rhythm: Normal rate.     Heart sounds: Normal heart sounds.  Pulmonary:     Effort: Pulmonary effort is normal. No respiratory distress.     Breath sounds: Normal breath sounds. No decreased breath sounds or wheezing.  Musculoskeletal:     Cervical back: Normal range of motion.  Skin:    General: Skin is warm and dry.  Neurological:     Mental Status: She is alert and oriented to person, place, and time.     GCS: GCS eye subscore is 4. GCS verbal subscore is 5. GCS motor subscore is 6.     Cranial Nerves: Cranial nerves 2-12 are intact.     Sensory: Sensation is intact.     Motor: Motor function is intact.     Coordination: Coordination is intact.     Gait: Gait is intact.  Psychiatric:        Attention and Perception: Attention and perception normal.        Mood and Affect: Mood and affect normal.        Speech: Speech normal.        Behavior: Behavior normal. Behavior is cooperative.        Thought Content: Thought content normal.        Cognition and Memory: Cognition normal.        Judgment: Judgment normal.      UC Treatments / Results  Labs (all labs ordered are listed, but only abnormal results are displayed) Labs Reviewed - No data to display  EKG   Radiology No results found.  Procedures Procedures (including critical care time)  Medications Ordered in UC Medications - No data  to display  Initial Impression / Assessment and Plan / UC Course  I have reviewed the triage vital signs and the nursing notes.  Pertinent labs & imaging results that were available during my care of the patient were reviewed by me and considered in my medical decision making (see chart for details).     Patient presented with 3-day history of sore throat, congestion, mild cough, and headache. Endorses some pain with swallowing. Denies shortness of breath, fever, abdominal pain, nausea, vomiting, and diarrhea. Denies taking anything over-the-counter for symptoms. Reports noticing thick yellow-green mucus. Upon assessment patient has mild erythema to throat, congestion and rhinorrhea, and tenderness to frontal and maxillary sinuses. Lung sounds clear bilaterally on auscultation. Patient has documented allergy to penicillin.  Prescribe doxycycline for acute sinusitis. Prescribed lidocaine as needed for sore throat. Discussed over-the-counter medications for symptoms. Discussed follow-up and return precautions. Final Clinical Impressions(s) / UC Diagnoses   Final diagnoses:  Acute non-recurrent frontal sinusitis  Sore throat     Discharge Instructions      Start taking doxycycline as prescribed for sinus infection. You can use lidocaine as needed for sore throat. Otherwise you can alternate between Tylenol and Ibuprofen for pain and fever. You can also use Flonase as needed for congestion.  ED Prescriptions     Medication Sig Dispense Auth. Provider   doxycycline (VIBRAMYCIN) 100 MG capsule Take 1 capsule (100 mg total) by mouth 2 (two) times daily. 20 capsule Susann Givens, Adonna Horsley A, NP   lidocaine (XYLOCAINE) 2 % solution Use as directed 15 mLs in the mouth or throat as needed for mouth pain. 100 mL Wynonia Lawman A, NP      PDMP not reviewed this encounter.   Wynonia Lawman A, NP 12/18/22 [phone]

## 2023-08-27 ENCOUNTER — Ambulatory Visit: Admitting: Medical-Surgical

## 2023-08-28 ENCOUNTER — Ambulatory Visit: Admitting: Medical-Surgical

## 2023-08-28 NOTE — Progress Notes (Deleted)
   Established patient visit  History, exam, impression, and plan:  No problem-specific Assessment & Plan notes found for this encounter.   ROS  Physical Exam  Procedures performed this visit: None.  No follow-ups on file.  __________________________________ Thayer Ohm, DNP, APRN, FNP-BC Primary Care and Sports Medicine [facility] [place]

## 2023-08-29 ENCOUNTER — Ambulatory Visit

## 2023-08-29 ENCOUNTER — Ambulatory Visit: Admitting: Medical-Surgical

## 2023-08-29 ENCOUNTER — Encounter: Payer: Self-pay | Admitting: Medical-Surgical

## 2023-08-29 ENCOUNTER — Ambulatory Visit: Payer: Self-pay | Admitting: Medical-Surgical

## 2023-08-29 VITALS — BP 114/72 | HR 75 | Resp 20 | Ht 63.0 in | Wt 349.7 lb

## 2023-08-29 DIAGNOSIS — R109 Unspecified abdominal pain: Secondary | ICD-10-CM | POA: Diagnosis not present

## 2023-08-29 DIAGNOSIS — R1032 Left lower quadrant pain: Secondary | ICD-10-CM

## 2023-08-29 DIAGNOSIS — Z6841 Body Mass Index (BMI) 40.0 and over, adult: Secondary | ICD-10-CM

## 2023-08-29 DIAGNOSIS — R7303 Prediabetes: Secondary | ICD-10-CM | POA: Diagnosis not present

## 2023-08-29 DIAGNOSIS — K429 Umbilical hernia without obstruction or gangrene: Secondary | ICD-10-CM | POA: Diagnosis not present

## 2023-08-29 DIAGNOSIS — R351 Nocturia: Secondary | ICD-10-CM

## 2023-08-29 DIAGNOSIS — Z87442 Personal history of urinary calculi: Secondary | ICD-10-CM

## 2023-08-29 DIAGNOSIS — F5089 Other specified eating disorder: Secondary | ICD-10-CM

## 2023-08-29 DIAGNOSIS — N946 Dysmenorrhea, unspecified: Secondary | ICD-10-CM

## 2023-08-29 DIAGNOSIS — N920 Excessive and frequent menstruation with regular cycle: Secondary | ICD-10-CM | POA: Diagnosis not present

## 2023-08-29 DIAGNOSIS — R102 Pelvic and perineal pain: Secondary | ICD-10-CM | POA: Diagnosis not present

## 2023-08-29 LAB — POCT URINALYSIS DIP (CLINITEK)
Bilirubin, UA: NEGATIVE
Blood, UA: NEGATIVE
Glucose, UA: NEGATIVE mg/dL
Ketones, POC UA: NEGATIVE mg/dL
Nitrite, UA: NEGATIVE
POC PROTEIN,UA: NEGATIVE
Spec Grav, UA: 1.025 (ref 1.010–1.025)
Urobilinogen, UA: 0.2 U/dL
pH, UA: 5.5 (ref 5.0–8.0)

## 2023-08-29 LAB — POCT GLYCOSYLATED HEMOGLOBIN (HGB A1C): Hemoglobin A1C: 6 % — AB (ref 4.0–5.6)

## 2023-08-29 MED ORDER — IBUPROFEN 800 MG PO TABS
800.0000 mg | ORAL_TABLET | Freq: Three times a day (TID) | ORAL | 5 refills | Status: AC | PRN
Start: 2023-08-29 — End: ?

## 2023-08-29 NOTE — Progress Notes (Addendum)
 Established patient visit  History, exam, impression, and plan:  1. LLQ abdominal pain (Primary) Pleasant 43 year old female presenting today with reports of approximately 3 weeks of lower abdominal pain that is mostly in the middle and the left. Notes that is radiates to her back and is worse when urinating. Complains of nocturia with an average of 8 times per night. See below for ROS and exam. Plan to check labs today. STAT renal stone study due to history of kidney stones.  - CBC with Differential/Platelet - CMP14+EGFR - Amylase - Lipase - CT RENAL STONE STUDY; Future  2. Nocturia 3. History of kidney stones POCT UA with trace leukocytes but otherwise normal. Sending for culture. STAT CT renal stone study.  - CT RENAL STONE STUDY; Future - POCT URINALYSIS DIP (CLINITEK) - Urine Culture  4. Morbid obesity with body mass index of 60.0-69.9 in adult (HCC) Checking TSH.  - TSH  5. Prediabetes Prior A1c about 1 year ago showing 5.7% but has not been rechecked since. Recheck today at 6.0% indicating worsening but still in the prediabetic category. Recommend working on diet, exercise, and weight loss for management and to prevent progression to type 2 diabetes.  - POCT HgB A1C  6. Menorrhagia with regular cycle History of heavy periods with regular cycle. Reports 2-3 days per month of debilitating cramps. Requesting refill on Ibuprofen , sent to pharmacy. Discussed possible treatments for this including surgical options. She is interested in further discussion. Referring to OB/GYN per patient request.  - Ambulatory referral to Obstetrics / Gynecology - ibuprofen  (ADVIL ) 800 MG tablet; Take 1 tablet (800 mg total) by mouth every 8 (eight) hours as needed.  Dispense: 30 tablet; Refill: 5  7. Pica Reports she has been craving and eating ice. Is feeling overall "horrible". With her menorrhagia, suspect iron deficiency. Not currently on an iron supplement. Checking iron today.  -  Iron, TIBC and Ferritin Panel  Review of Systems  Constitutional:  Positive for malaise/fatigue. Negative for chills and fever.  Respiratory:  Negative for cough, shortness of breath and wheezing.   Cardiovascular:  Negative for chest pain and palpitations.  Gastrointestinal:  Positive for abdominal pain (LLQ and Suprapubic; worse with urinating) and nausea. Negative for blood in stool, constipation, diarrhea, heartburn, melena and vomiting.  Genitourinary:  Positive for dysuria, flank pain and frequency. Negative for hematuria and urgency.       Menorrhagia  Musculoskeletal:  Positive for back pain.   Physical Exam Vitals reviewed.  Constitutional:      General: She is not in acute distress.    Appearance: Normal appearance. She is well-developed. She is obese. She is not ill-appearing.  HENT:     Head: Normocephalic and atraumatic.  Cardiovascular:     Rate and Rhythm: Normal rate and regular rhythm.     Pulses: Normal pulses.     Heart sounds: Normal heart sounds. No murmur heard.    No friction rub. No gallop.  Pulmonary:     Effort: Pulmonary effort is normal. No respiratory distress.     Breath sounds: Normal breath sounds. No wheezing.  Abdominal:     General: Bowel sounds are normal. There is no distension or abdominal bruit. There are no signs of injury.     Palpations: Abdomen is soft.     Tenderness: There is abdominal tenderness (Exquisitely tender with light palpation) in the left lower quadrant. There is guarding (LLQ) and rebound. Negative signs include Murphy's sign,  Rovsing's sign, McBurney's sign, psoas sign and obturator sign.     Hernia: No hernia is present.  Skin:    General: Skin is warm and dry.  Neurological:     Mental Status: She is alert and oriented to person, place, and time.  Psychiatric:        Mood and Affect: Mood normal.        Behavior: Behavior normal.        Thought Content: Thought content normal.        Judgment: Judgment normal.    Procedures performed this visit: None.  Return if symptoms worsen or fail to improve. Further follow up depending on results of labs/imaging.  __________________________________ Christina Snook, DNP, APRN, FNP-BC Primary Care and Sports Medicine [facility] [place]

## 2023-08-30 LAB — CBC WITH DIFFERENTIAL/PLATELET
Basophils Absolute: 0 10*3/uL (ref 0.0–0.2)
Basos: 0 %
EOS (ABSOLUTE): 0.1 10*3/uL (ref 0.0–0.4)
Eos: 2 %
Hematocrit: 35.8 % (ref 34.0–46.6)
Hemoglobin: 10.3 g/dL — ABNORMAL LOW (ref 11.1–15.9)
Immature Grans (Abs): 0 10*3/uL (ref 0.0–0.1)
Immature Granulocytes: 0 %
Lymphocytes Absolute: 2.1 10*3/uL (ref 0.7–3.1)
Lymphs: 27 %
MCH: 21.5 pg — ABNORMAL LOW (ref 26.6–33.0)
MCHC: 28.8 g/dL — ABNORMAL LOW (ref 31.5–35.7)
MCV: 75 fL — ABNORMAL LOW (ref 79–97)
Monocytes Absolute: 0.5 10*3/uL (ref 0.1–0.9)
Monocytes: 6 %
Neutrophils Absolute: 5 10*3/uL (ref 1.4–7.0)
Neutrophils: 65 %
Platelets: 289 10*3/uL (ref 150–450)
RBC: 4.8 x10E6/uL (ref 3.77–5.28)
RDW: 15.6 % — ABNORMAL HIGH (ref 11.7–15.4)
WBC: 7.8 10*3/uL (ref 3.4–10.8)

## 2023-08-30 LAB — CMP14+EGFR
ALT: 10 IU/L (ref 0–32)
AST: 15 IU/L (ref 0–40)
Albumin: 4.3 g/dL (ref 3.9–4.9)
Alkaline Phosphatase: 52 IU/L (ref 44–121)
BUN/Creatinine Ratio: 17 (ref 9–23)
BUN: 13 mg/dL (ref 6–24)
Bilirubin Total: 0.5 mg/dL (ref 0.0–1.2)
CO2: 21 mmol/L (ref 20–29)
Calcium: 9.4 mg/dL (ref 8.7–10.2)
Chloride: 102 mmol/L (ref 96–106)
Creatinine, Ser: 0.75 mg/dL (ref 0.57–1.00)
Globulin, Total: 3.4 g/dL (ref 1.5–4.5)
Glucose: 86 mg/dL (ref 70–99)
Potassium: 4.4 mmol/L (ref 3.5–5.2)
Sodium: 138 mmol/L (ref 134–144)
Total Protein: 7.7 g/dL (ref 6.0–8.5)
eGFR: 101 mL/min/{1.73_m2} (ref >59)

## 2023-08-30 LAB — IRON,TIBC AND FERRITIN PANEL
Ferritin: 9 ng/mL — ABNORMAL LOW (ref 15–150)
Iron Saturation: 9 % — CL (ref 15–55)
Iron: 37 ug/dL (ref 27–159)
Total Iron Binding Capacity: 419 ug/dL (ref 250–450)
UIBC: 382 ug/dL (ref 131–425)

## 2023-08-30 LAB — AMYLASE: Amylase: 123 U/L — ABNORMAL HIGH (ref 31–110)

## 2023-08-30 LAB — LIPASE: Lipase: 25 U/L (ref 14–72)

## 2023-08-30 LAB — TSH: TSH: 2.11 u[IU]/mL (ref 0.450–4.500)

## 2023-08-31 DIAGNOSIS — F5089 Other specified eating disorder: Secondary | ICD-10-CM | POA: Insufficient documentation

## 2023-08-31 DIAGNOSIS — N920 Excessive and frequent menstruation with regular cycle: Secondary | ICD-10-CM | POA: Insufficient documentation

## 2023-08-31 LAB — URINE CULTURE

## 2023-09-03 MED ORDER — METFORMIN HCL ER 500 MG PO TB24: 500.0000 mg | ORAL_TABLET | Freq: Every day | ORAL | 1 refills | Status: DC

## 2023-09-03 MED ORDER — FERROUS SULFATE 325 (65 FE) MG PO TBEC: 325.0000 mg | DELAYED_RELEASE_TABLET | Freq: Every day | ORAL | 3 refills | Status: DC

## 2023-09-03 NOTE — Addendum Note (Signed)
 Addended byCherre Cornish on: 09/03/2023 05:48 PM   Modules accepted: Orders

## 2023-09-09 ENCOUNTER — Other Ambulatory Visit

## 2023-11-12 ENCOUNTER — Ambulatory Visit: Admitting: Obstetrics and Gynecology

## 2023-11-12 ENCOUNTER — Encounter: Payer: Self-pay | Admitting: Obstetrics and Gynecology

## 2023-11-12 VITALS — BP 139/81 | HR 67 | Ht 63.0 in | Wt 352.0 lb

## 2023-11-12 DIAGNOSIS — Z1331 Encounter for screening for depression: Secondary | ICD-10-CM

## 2023-11-12 DIAGNOSIS — N920 Excessive and frequent menstruation with regular cycle: Secondary | ICD-10-CM | POA: Diagnosis not present

## 2023-11-12 DIAGNOSIS — Z8742 Personal history of other diseases of the female genital tract: Secondary | ICD-10-CM

## 2023-11-12 MED ORDER — HYDROCODONE-ACETAMINOPHEN 5-325 MG PO TABS: 1.0000 | ORAL_TABLET | Freq: Four times a day (QID) | ORAL | 0 refills | Status: AC | PRN

## 2023-11-12 MED ORDER — NAPROXEN 500 MG PO TABS: 500.0000 mg | ORAL_TABLET | Freq: Two times a day (BID) | ORAL | 1 refills | Status: DC

## 2023-11-12 MED ORDER — TRANEXAMIC ACID 650 MG PO TABS: 1300.0000 mg | ORAL_TABLET | Freq: Three times a day (TID) | ORAL | 0 refills | Status: AC

## 2023-11-12 NOTE — Progress Notes (Signed)
 GYNECOLOGY ENCOUNTER NOTE  History:     [name] is a 43 y.o. [id number] female here for Concerns regarding heavy menstrual bleeding. She reports regular monthly periods. Not currently on hormones or on anything that would alter her periods. She reports missing work due to painful/ heavy periods. She is interested in discussing all options, but also interested in discussing ablation.   Hx of BTL  Gynecologic History Patient's last menstrual period was 10/16/2023. Contraception: tubal ligation Last Pap: 2023. Result was normal with negative HPV Last Mammogram: NA will order today.   Obstetric History OB History  Gravida Para Term Preterm AB Living  5 4 4  1 4   SAB IAB Ectopic Multiple Live Births  1   0 4    # Outcome Date GA Lbr Len/2nd Weight Sex Type Anes PTL Lv  5 Term 07/20/15 [redacted]w[redacted]d  8 lb 8.5 oz (3.87 kg) M CS-LTranv Spinal  LIV  4 Term 12/24/08    M CS-LTranv   LIV  3 SAB 04/02/06          2 Term 02/11/97    F Vag-Spont   LIV  1 Term 12/29/92    [name]   LIV    Past Medical History:  Diagnosis Date   [redacted] weeks gestation of pregnancy    Advanced maternal age in multigravida 01/17/2015   Arthritis    back; right foot; left ankle (07/22/2014)   Cellulitis of leg, right 07/2014   Chronic lower back pain    History of COVID-19 06/21/2020   Morbid obesity with BMI of 50.0-59.9, adult (HCC)    Prediabetes    S/P cesarean section 07/20/2015   Vaginal delivery 1998    Past Surgical History:  Procedure Laterality Date   CESAREAN SECTION  1994; 2010   CESAREAN SECTION N/A 07/20/2015   Procedure: REPEAT CESAREAN SECTION;  Surgeon: Carlin DELENA Centers, MD;  Location: [facility];  Service: Obstetrics;  Laterality: N/A;   DILATION AND CURETTAGE OF UTERUS  ~ 2008   TUBAL LIGATION      Current Outpatient Medications on File Prior to Visit  Medication Sig Dispense Refill   ferrous sulfate  325 (65 FE) MG EC tablet Take 1 tablet (325 mg total) by mouth daily with breakfast.  90 tablet 3   ibuprofen  (ADVIL ) 800 MG tablet Take 1 tablet (800 mg total) by mouth every 8 (eight) hours as needed. 30 tablet 5   metFORMIN  (GLUCOPHAGE -XR) 500 MG 24 hr tablet Take 1 tablet (500 mg total) by mouth daily with breakfast. 90 tablet 1   No current facility-administered medications on file prior to visit.    Allergies  Allergen Reactions   Penicillin G Rash   Penicillins Hives   Tramadol  Hcl Other (See Comments)    hallucinations    Social History:  reports that she has never smoked. She has never used smokeless tobacco. She reports that she does not currently use alcohol. She reports that she does not use drugs.  Family History  Problem Relation Age of Onset   Diabetes Maternal Grandfather    Diabetes Father    Prostate cancer Father    Hypertension Mother    Breast cancer Other     The following portions of the patient's history were reviewed and updated as appropriate: allergies, current medications, past family history, past medical history, past social history, past surgical history and problem list.  Review of Systems Pertinent items noted in HPI and remainder of comprehensive  ROS otherwise negative.  Physical Exam:  BP 139/81   Pulse 67   Ht 5' 3 (1.6 m)   Wt (!) 352 lb (159.7 kg)   LMP 10/16/2023   BMI 62.35 kg/m  CONSTITUTIONAL: Well-developed, well-nourished female in no acute distress.  HENT:  Normocephalic SKIN: Skin is warm and dry. No rash noted. Not diaphoretic. No erythema. No pallor. MUSCULOSKELETAL: Normal range of motion.  ABDOMEN: Soft, no distention noted.  No tenderness, rebound or guarding.  PELVIC: Normal appearing external genitalia and urethral meatus. Enlarged uterine size, no other palpable masses, no uterine or adnexal tenderness.  Performed in the presence of a chaperone.   Assessment and Plan:  1. History of heavy vaginal bleeding (Primary)  Discussed management options for abnormal uterine bleeding including NSAIDs  (Naproxen ), tranexamic acid  (Lysteda ), oral progesterone, Depo Provera, Levonogestrel IUD, endometrial ablation or hysterectomy as definitive surgical management.  Discussed risks and benefits of each method.   Patient desires IUD insertion, however also interested in discussing ablation procedure. Bleeding precautions reviewed.   -She is due to start her period this week. Will start with Lysteda , Naproxen , and a short course of Vicodin. She will schedule a f/u visit for IUD insertion. She would also like to ask questions about Ablation in case that is what she decides to do in the future.  - US  PELVIC COMPLETE WITH TRANSVAGINAL; Future - MM Digital Screening; Future     Tighe Gitto, Delon FERNS, NP [facility] [employer], [facility]

## 2023-11-18 ENCOUNTER — Other Ambulatory Visit

## 2023-11-19 ENCOUNTER — Other Ambulatory Visit

## 2023-11-19 DIAGNOSIS — Z8742 Personal history of other diseases of the female genital tract: Secondary | ICD-10-CM

## 2023-11-19 DIAGNOSIS — N920 Excessive and frequent menstruation with regular cycle: Secondary | ICD-10-CM | POA: Diagnosis not present

## 2023-11-19 DIAGNOSIS — D252 Subserosal leiomyoma of uterus: Secondary | ICD-10-CM | POA: Diagnosis not present

## 2023-11-19 DIAGNOSIS — N939 Abnormal uterine and vaginal bleeding, unspecified: Secondary | ICD-10-CM | POA: Diagnosis not present

## 2023-11-19 DIAGNOSIS — N888 Other specified noninflammatory disorders of cervix uteri: Secondary | ICD-10-CM | POA: Diagnosis not present

## 2023-11-28 ENCOUNTER — Other Ambulatory Visit (HOSPITAL_BASED_OUTPATIENT_CLINIC_OR_DEPARTMENT_OTHER): Payer: Self-pay

## 2023-11-28 ENCOUNTER — Emergency Department (HOSPITAL_BASED_OUTPATIENT_CLINIC_OR_DEPARTMENT_OTHER)
Admission: EM | Admit: 2023-11-28 | Discharge: 2023-11-28 | Disposition: A | Discharge status: Home / Self Care | Attending: Emergency Medicine | Admitting: Emergency Medicine

## 2023-11-28 ENCOUNTER — Emergency Department (HOSPITAL_BASED_OUTPATIENT_CLINIC_OR_DEPARTMENT_OTHER): Admitting: Radiology

## 2023-11-28 ENCOUNTER — Emergency Department (HOSPITAL_BASED_OUTPATIENT_CLINIC_OR_DEPARTMENT_OTHER)

## 2023-11-28 ENCOUNTER — Other Ambulatory Visit: Payer: Self-pay

## 2023-11-28 ENCOUNTER — Encounter (HOSPITAL_BASED_OUTPATIENT_CLINIC_OR_DEPARTMENT_OTHER): Payer: Self-pay | Admitting: Emergency Medicine

## 2023-11-28 DIAGNOSIS — M47812 Spondylosis without myelopathy or radiculopathy, cervical region: Secondary | ICD-10-CM | POA: Diagnosis not present

## 2023-11-28 DIAGNOSIS — R519 Headache, unspecified: Secondary | ICD-10-CM | POA: Diagnosis not present

## 2023-11-28 DIAGNOSIS — S199XXA Unspecified injury of neck, initial encounter: Secondary | ICD-10-CM | POA: Diagnosis not present

## 2023-11-28 DIAGNOSIS — Y92410 Unspecified street and highway as the place of occurrence of the external cause: Secondary | ICD-10-CM | POA: Diagnosis not present

## 2023-11-28 DIAGNOSIS — S0990XA Unspecified injury of head, initial encounter: Secondary | ICD-10-CM | POA: Diagnosis not present

## 2023-11-28 DIAGNOSIS — M542 Cervicalgia: Secondary | ICD-10-CM | POA: Diagnosis not present

## 2023-11-28 DIAGNOSIS — M25512 Pain in left shoulder: Secondary | ICD-10-CM | POA: Diagnosis not present

## 2023-11-28 DIAGNOSIS — R0789 Other chest pain: Secondary | ICD-10-CM | POA: Insufficient documentation

## 2023-11-28 DIAGNOSIS — M25511 Pain in right shoulder: Secondary | ICD-10-CM | POA: Diagnosis not present

## 2023-11-28 MED ORDER — METHOCARBAMOL 500 MG PO TABS: 500.0000 mg | ORAL_TABLET | Freq: Two times a day (BID) | ORAL | 0 refills | Status: DC

## 2023-11-28 MED ORDER — METHOCARBAMOL 500 MG PO TABS
500.0000 mg | ORAL_TABLET | Freq: Two times a day (BID) | ORAL | 0 refills | Status: DC
  Filled 2023-11-28: qty 20, 10d supply, fill #0

## 2023-11-28 MED ORDER — KETOROLAC TROMETHAMINE 15 MG/ML IJ SOLN
15.0000 mg | Freq: Once | INTRAMUSCULAR | Status: AC
  Administered 2023-11-28: 15 mg via INTRAMUSCULAR
  Filled 2023-11-28: qty 1

## 2023-11-28 NOTE — ED Notes (Signed)
 Reviewed AVS/discharge instruction with patient. Time allotted for and all questions answered. Patient is agreeable for d/c and escorted to ed exit by staff.
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
## 2023-11-28 NOTE — Discharge Instructions (Signed)
 As we discussed, you will be sore starting tomorrow.  I would alternate ibuprofen  and Tylenol .  I have also prescribed you some Robaxin  which is a muscle relaxer.  Please do not mix this with alcohol.  Please be careful while operating heavy machinery while taking this medication.  You can follow-up with your primary care doctor in 1 week or so.  You can return to the emergency department for any worsening symptoms.
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
## 2023-11-29 ENCOUNTER — Other Ambulatory Visit (HOSPITAL_BASED_OUTPATIENT_CLINIC_OR_DEPARTMENT_OTHER): Payer: Self-pay

## 2023-12-08 ENCOUNTER — Encounter: Payer: Self-pay | Admitting: Obstetrics and Gynecology

## 2023-12-08 NOTE — Progress Notes (Unsigned)
 GYNECOLOGY OFFICE VISIT NOTE  History:  [name] is a 43 y.o. [id number] here today for IUD insertion but also to discuss options available for her painful and heavy periods.   She had an US  which showed multiple structures in her uterus c/w fibroids. There are two that communicate with the EL but are largely intramural otherwise. EL was 2.3 cm. Ovaries normal in appearance. She otherwise had two other fibroids that was 3 cm or less and well away from the endometrium - both intramural.      Past Medical History:  Diagnosis Date   Arthritis    back; right foot; left ankle (07/22/2014)   Cellulitis of leg, right 07/2014   Chronic lower back pain    History of COVID-19 06/21/2020   Morbid obesity with BMI of 50.0-59.9, adult (HCC)    Prediabetes     Past Surgical History:  Procedure Laterality Date   CESAREAN SECTION  1994; 2010   CESAREAN SECTION N/A 07/20/2015   Procedure: REPEAT CESAREAN SECTION;  Surgeon: Carlin DELENA Centers, MD;  Location: [facility];  Service: Obstetrics;  Laterality: N/A;   DILATION AND CURETTAGE OF UTERUS  ~ 2008   TUBAL LIGATION      The following portions of the patient's history were reviewed and updated as appropriate: allergies, current medications, past family history, past medical history, past social history, past surgical history and problem list.   Health Maintenance:   Normal pap and negative HRHPV:   Diagnosis  Date Value Ref Range Status  09/07/2021   Final   - Negative for intraepithelial lesion or malignancy (NILM)     Review of Systems:  Pertinent items noted in HPI and remainder of comprehensive ROS otherwise negative.  Physical Exam:  LMP 11/20/2023 (Approximate)  CONSTITUTIONAL: Well-developed, well-nourished female in no acute distress.  HEENT:  Normocephalic, atraumatic. External right and left ear normal. No scleral icterus.  NECK: Normal range of motion, supple, no masses noted on observation SKIN: No rash noted. Not  diaphoretic. No erythema. No pallor. MUSCULOSKELETAL: Normal range of motion. No edema noted. NEUROLOGIC: Alert and oriented to person, place, and time. Normal muscle tone coordination. No cranial nerve deficit noted. PSYCHIATRIC: Normal mood and affect. Normal behavior. Normal judgment and thought content.  PELVIC: Normal appearing external genitalia; normal urethral meatus; normal appearing vaginal mucosa and cervix.  No abnormal discharge noted.  Normal uterine size, no other palpable masses, no uterine or adnexal tenderness. Performed in the presence of a chaperone  Labs and Imaging US  PELVIC COMPLETE WITH TRANSVAGINAL Result Date: 11/28/2023 CLINICAL DATA:  Heavy vaginal bleeding and menstrual cycles. Heavy periods with pain for years, getting worse. LMP was 10/18/2023. EXAM: TRANSABDOMINAL AND TRANSVAGINAL ULTRASOUND OF PELVIS TECHNIQUE: Both transabdominal and transvaginal ultrasound examinations of the pelvis were performed. Transabdominal technique was performed for global imaging of the pelvis including uterus, ovaries, adnexal regions, and pelvic cul-de-sac. It was necessary to proceed with endovaginal exam following the transabdominal exam to visualize the ovaries and endometrium. COMPARISON:  CT abdomen and pelvis 08/29/2023 FINDINGS: Uterus Measurements: 13.5 x 5.8 x 8.5 cm = volume: 351 mL. Uterus is retroflexed. Several somewhat poorly defined hypoechoic nodules are demonstrated in the endometrium consistent with uterine fibroids. A submucosal fibroid in the left posterior uterus measures 2.9 cm diameter. A central submucosal fibroid measures 2.6 cm diameter. A subserosal fibroid at the uterine fundus measures 3.1 cm diameter. Small nabothian cysts in the cervix. Endometrium Thickness: 23.8 mm. No endometrial fluid or focal  abnormality is seen. Right ovary Measurements: 3.5 x 1.7 x 2.6 cm = volume: 8 mL. Normal appearance/no adnexal mass. Left ovary The left ovary is not visualized. No  abnormal adnexal masses are seen. Other findings No abnormal free fluid. IMPRESSION: 1. Multiple uterine fibroids. 2. Homogeneous but increased endometrial stripe thickness measuring 2.4 cm. If bleeding remains unresponsive to hormonal or medical therapy, focal lesion work-up with sonohysterogram should be considered. Endometrial biopsy should also be considered in pre-menopausal patients at high risk for endometrial carcinoma. (Ref: Radiological Reasoning: Algorithmic Workup of Abnormal Vaginal Bleeding with Endovaginal Sonography and Sonohysterography. AJR 2008; 808:D31-26) 3. Normal appearance of the right ovary. Left ovary is not visualized. Electronically Signed   By: Elsie Gravely M.D.   On: 11/28/2023 18:29    GYNECOLOGY OFFICE PROCEDURE NOTE  [name] is a 43 y.o. [id number] here for IUD insertion and EMB  IUD Insertion Procedure Note Procedure: IUD insertion with {Blank single:19197::Mirena ,Skyla ,Liletta ,Kyleena ,Paragard} UPT: {Blank single:19197::Negative} GC/CT testing: {Blank single:19197::Up to date,Offered and declines,Offered and accepts}  Patient identified.  Risks, benefits and alternatives of procedure were discussed including irregular bleeding, cramping, infection, malpositioning or misplacement of the IUD outside the uterus which may require further procedure such as laparoscopy. Also discussed >99% contraception efficacy, increased risk of ectopic pregnancy with failure of method.   Emphasized that this did not protect against STIs, condoms recommended during all sexual encounters. Consent signed. Time out performed.   Speculum inserted and cervix visualized, prepped with 3 swabs of betadine. Intracervical block was done with 1% lidocaine  at 12/3/6/9 for a total of *** cc.   EMB done ***. The 3 mm pipelle was easily introduced into the endometrial cavity without difficulty to a depth of *** cm, and a {Blank single:19197::Scant,Moderate} amount of  tissue was obtained after two passes and sent to pathology. The instruments were removed from the patient's vagina. Minimal bleeding from the cervix was noted. The patient tolerated the procedure well.  {Blank multiple:19196::Grasped with a single tooth tenaculum ,Uterus sounded to *** cm ,Cervical dilators used,IUD then inserted without difficulty per manufacturer's instructions and strings cut to 3 cm below cervical os and all instruments removed. Pt tolerated well with minimal pain and bleeding.}     Assessment and Plan:  1. Encounter for IUD insertion (Primary) Discussed concerning signs/symptoms and to call if heavy bleeding, severe abdominal pain, or fever in the following 3 weeks. Manufacturer pamphlet/patient information given. Reviewed timing of efficacy for contraception and to use an alternative form of birth control until that time.  2. Uterine leiomyoma, unspecified location - EMB done today. *** - Discussed for her heavy bleeding, I would not recommend endometrial ablation. It makes sampling the uterus more challenging in the future and this is important with BMI >60. Additionally, she has had 3 prior c-sections.  - Uterine fibroids: The patient's fibroids are symptomatic and treatment options of expectant management, medical therapy, and surgical therapy were discussed. - Expectant management - The patient's fibroids were discussed and expectant management was offered with strict precautions. - TXA - this medication was discussed as a means to control vaginal bleeding. Discussed it would not impact growth of the fibroids either way. Its main advantage is avoiding hormonal or surgical therapy and gives an option for therapy besides expectant management.  - We discussed progesterone only options including - POP, Depo Provera and Lng-IUD.  We reviewed risks and benefits and proper use. Progesterone Only Birth Control Pills (POP)- The use of progesterone only birth control  pills was  discussed with the patient. The control of fibroid symptoms was discussed and the risks/benefits of therapy were discussed. The fact that this type of therapy does not contain estrogen was discussed with the patient.  Proper use was also discussed. We reviewed possible induction of amenorrhea with each options as well as the possibility of break through bleeding.  - We discussed the GnRH-agonists and antagonists. Reviewed both short term and long term impact of these medications. Reviewed short term impact of Depo Lupron (agonist) on bleeding.  - We discussed surgical/procedural options available: RFA (I.e. Sonata), myomectomy and hysterectomy. We discussed the risks and benefits for each of these specific procedures. For the sonata, reviewed that we would need to sign a special consent form for this procedure. We discussed the types and sizes of fibroids that are candidates for hysteroscopic resection of fibroids as well. Hers would not be possible to remove in this fashion. If she desired hysterectomy, will refer to Dr. Jeralyn for robotic option.  - Following counseling, the patient would like to {Blank multiple:19196::***,Expectant management,TXA,POPs,Depo Provera,LnIUD,GnRH blocking medications,UAE,Sonata}   Diagnoses and all orders for this visit:  Encounter for IUD insertion  Uterine leiomyoma, unspecified location     No orders of the defined types were placed in this encounter.    Routine preventative health maintenance measures emphasized. Please refer to After Visit Summary for other counseling recommendations.   No follow-ups on file.  Vina Solian, MD, FACOG Obstetrician & Gynecologist, [facility] for [facility], [facility]

## 2023-12-11 ENCOUNTER — Encounter: Payer: Self-pay | Admitting: Obstetrics and Gynecology

## 2023-12-11 ENCOUNTER — Ambulatory Visit: Admitting: Obstetrics and Gynecology

## 2023-12-11 ENCOUNTER — Other Ambulatory Visit (HOSPITAL_COMMUNITY)
Admission: RE | Admit: 2023-12-11 | Discharge: 2023-12-11 | Disposition: A | Discharge status: Home / Self Care | Source: Ambulatory Visit | Attending: Obstetrics and Gynecology | Admitting: Obstetrics and Gynecology

## 2023-12-11 VITALS — BP 101/69 | HR 72 | Ht 63.0 in | Wt 346.0 lb

## 2023-12-11 DIAGNOSIS — Z30430 Encounter for insertion of intrauterine contraceptive device: Secondary | ICD-10-CM | POA: Diagnosis not present

## 2023-12-11 DIAGNOSIS — N920 Excessive and frequent menstruation with regular cycle: Secondary | ICD-10-CM

## 2023-12-11 DIAGNOSIS — D251 Intramural leiomyoma of uterus: Secondary | ICD-10-CM | POA: Diagnosis not present

## 2023-12-11 DIAGNOSIS — D259 Leiomyoma of uterus, unspecified: Secondary | ICD-10-CM | POA: Diagnosis not present

## 2023-12-11 DIAGNOSIS — Z3202 Encounter for pregnancy test, result negative: Secondary | ICD-10-CM | POA: Diagnosis not present

## 2023-12-11 DIAGNOSIS — N939 Abnormal uterine and vaginal bleeding, unspecified: Secondary | ICD-10-CM | POA: Diagnosis not present

## 2023-12-11 LAB — POCT URINE PREGNANCY: Preg Test, Ur: NEGATIVE

## 2023-12-11 MED ORDER — LEVONORGESTREL 20 MCG/DAY IU IUD
1.0000 | INTRAUTERINE_SYSTEM | Freq: Once | INTRAUTERINE | Status: AC
Start: 2023-12-11 — End: 2023-12-11
  Administered 2023-12-11: 1 via INTRAUTERINE

## 2023-12-11 MED ORDER — KETOROLAC TROMETHAMINE 60 MG/2ML IM SOLN
60.0000 mg | Freq: Once | INTRAMUSCULAR | Status: AC
Start: 2023-12-11 — End: 2023-12-11
  Administered 2023-12-11: 60 mg via INTRAMUSCULAR

## 2023-12-11 MED ORDER — SLYND 4 MG PO TABS: 1.0000 | ORAL_TABLET | Freq: Every day | ORAL | 3 refills | Status: DC

## 2023-12-11 NOTE — Addendum Note (Signed)
 Addended by: ELODIE NEST T on: 12/11/2023 12:05 PM   Modules accepted: Orders

## 2023-12-13 LAB — SURGICAL PATHOLOGY

## 2023-12-16 ENCOUNTER — Ambulatory Visit: Payer: Self-pay | Admitting: Obstetrics and Gynecology

## 2023-12-19 ENCOUNTER — Ambulatory Visit

## 2023-12-19 DIAGNOSIS — Z1231 Encounter for screening mammogram for malignant neoplasm of breast: Secondary | ICD-10-CM | POA: Diagnosis not present

## 2023-12-19 DIAGNOSIS — Z8742 Personal history of other diseases of the female genital tract: Secondary | ICD-10-CM

## 2023-12-24 ENCOUNTER — Other Ambulatory Visit: Payer: Self-pay | Admitting: Obstetrics and Gynecology

## 2023-12-24 ENCOUNTER — Ambulatory Visit: Payer: Self-pay | Admitting: Obstetrics and Gynecology

## 2023-12-24 DIAGNOSIS — R928 Other abnormal and inconclusive findings on diagnostic imaging of breast: Secondary | ICD-10-CM

## 2024-01-01 ENCOUNTER — Ambulatory Visit
Admission: RE | Admit: 2024-01-01 | Discharge: 2024-01-01 | Disposition: A | Source: Ambulatory Visit | Attending: Obstetrics and Gynecology | Admitting: Obstetrics and Gynecology

## 2024-01-01 ENCOUNTER — Other Ambulatory Visit: Payer: Self-pay | Admitting: Obstetrics and Gynecology

## 2024-01-01 DIAGNOSIS — R928 Other abnormal and inconclusive findings on diagnostic imaging of breast: Secondary | ICD-10-CM

## 2024-01-01 DIAGNOSIS — N6325 Unspecified lump in the left breast, overlapping quadrants: Secondary | ICD-10-CM

## 2024-03-17 ENCOUNTER — Ambulatory Visit (HOSPITAL_COMMUNITY)
Admission: EM | Admit: 2024-03-17 | Discharge: 2024-03-17 | Disposition: A | Discharge status: Home / Self Care | Attending: Internal Medicine | Admitting: Internal Medicine

## 2024-03-17 ENCOUNTER — Encounter (HOSPITAL_COMMUNITY): Payer: Self-pay | Admitting: Internal Medicine

## 2024-03-17 ENCOUNTER — Other Ambulatory Visit: Payer: Self-pay

## 2024-03-17 DIAGNOSIS — T7840XA Allergy, unspecified, initial encounter: Secondary | ICD-10-CM | POA: Diagnosis not present

## 2024-03-17 DIAGNOSIS — L235 Allergic contact dermatitis due to other chemical products: Secondary | ICD-10-CM

## 2024-03-17 DIAGNOSIS — L509 Urticaria, unspecified: Secondary | ICD-10-CM | POA: Diagnosis not present

## 2024-03-17 MED ORDER — METHYLPREDNISOLONE SODIUM SUCC 125 MG IJ SOLR
80.0000 mg | Freq: Once | INTRAMUSCULAR | Status: AC
  Administered 2024-03-17: 20:00:00 80 mg via INTRAMUSCULAR

## 2024-03-17 MED ORDER — HYDROXYZINE HCL 25 MG PO TABS: 25.0000 mg | ORAL_TABLET | Freq: Four times a day (QID) | ORAL | 0 refills | Status: DC

## 2024-03-17 MED ORDER — METHYLPREDNISOLONE SODIUM SUCC 125 MG IJ SOLR
INTRAMUSCULAR | Status: AC
  Filled 2024-03-17: qty 2

## 2024-03-17 MED ORDER — TRIAMCINOLONE ACETONIDE 0.1 % EX CREA: 1.0000 | TOPICAL_CREAM | Freq: Two times a day (BID) | CUTANEOUS | 0 refills | Status: DC

## 2024-03-17 NOTE — Discharge Instructions (Addendum)
 Symptoms and physical exam findings are consistent with an allergic reaction causing allergic dermatitis. Due to the progressive worsening and diffuseness of the symptoms we will treat with a steroid injection as well as topical steroids.  We will treat with the following: Medrol  injection given today. This is a steroid to help with swelling and allergic reaction.  Triamcinolone  cream twice daily to the affected area as needed for itching/rash.  Do not apply this to the neck or face.  Hydroxyzine  25 mg every 6 hours as needed for itching.  Use caution as this medication can cause some drowsiness. Use mild soaps and dye free products to help reduce the risk of further reaction Return to urgent care or PCP if symptoms worsen or fail to resolve.

## 2024-03-17 NOTE — ED Triage Notes (Signed)
 C/O very pruritic sporadic spotty rash to BUE and bilat ankle areas x 3 days. Has tried triamcinolone  and Benadryl .

## 2024-03-17 NOTE — ED Provider Notes (Signed)
 [facility]    CSN: [id number] Arrival date & time: 03/17/24  1756      History   Chief Complaint Chief Complaint  Patient presents with   Rash    HPI DIARRA CEJA is a 43 y.o. female.   43 year old female presents urgent care with complaints of a diffuse, itchy rash.  This started about 3 days ago but has progressively gotten worse.  She does not have any rash on the bottoms of her feet or palms of her hands.  She does not have anything in her mouth.  She reports that the areas have progressively getting worse and worse.  She did have some new soaps that she had used recently but was unsure if this could have caused a reaction.  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   Rash Associated symptoms: no abdominal pain, no fever, no joint pain, no shortness of breath, no sore throat and not vomiting     Past Medical History:  Diagnosis Date   Arthritis    back; right foot; left ankle (07/22/2014)   Cellulitis of leg, right 07/2014   Chronic lower back pain    History of COVID-19 06/21/2020   Morbid obesity with BMI of 50.0-59.9, adult [facility])    Prediabetes     Patient Active Problem List   Diagnosis Date Noted   Menorrhagia with regular cycle 08/31/2023   Pica 08/31/2023   Sinobronchitis 06/28/2022   Hematuria 06/27/2022   Chronic bilateral low back pain with right-sided sciatica 06/21/2020   Gastroenteritis 06/16/2015   Anemia 07/24/2014   Morbid obesity with body mass index of 60.0-69.9 in adult [facility]) 07/22/2014   Prediabetes 07/22/2014   Fissure in skin of foot 07/22/2014   GERD without esophagitis 08/28/2012    Past Surgical History:  Procedure Laterality Date   CESAREAN SECTION  1994; 2010   CESAREAN SECTION N/A 07/20/2015   Procedure: REPEAT CESAREAN SECTION;  Surgeon: Carlin DELENA Centers, MD;  Location: [facility];  Service: Obstetrics;  Laterality: N/A;   DILATION AND CURETTAGE OF UTERUS  ~ 2008   TUBAL  LIGATION      OB History     Gravida  5   Para  4   Term  4   Preterm      AB  1   Living  3      SAB  1   IAB      Ectopic      Multiple  0   Live Births  4            Home Medications    Prior to Admission medications  Medication Sig Start Date End Date Taking? Authorizing Provider  Drospirenone  (SLYND ) 4 MG TABS Take 1 tablet (4 mg total) by mouth daily. 12/11/23  Yes Cleatus Moccasin, MD  ferrous sulfate  325 (65 FE) MG EC tablet Take 1 tablet (325 mg total) by mouth daily with breakfast. 09/03/23  Yes Willo Mini, NP  metFORMIN  (GLUCOPHAGE -XR) 500 MG 24 hr tablet Take 1 tablet (500 mg total) by mouth daily with breakfast. 09/03/23  Yes Willo Mini, NP  ibuprofen  (ADVIL ) 800 MG tablet Take 1 tablet (800 mg total) by mouth every 8 (eight) hours as needed. 08/29/23   Willo Mini, NP  methocarbamol  (ROBAXIN ) 500 MG tablet Take 1 tablet (500 mg total) by mouth 2 (two) times daily. Patient not taking: Reported on 12/11/2023 11/28/23   Daralene Lonni BIRCH, PA-C  naproxen  (NAPROSYN ) 500 MG tablet Take 1 tablet (500 mg total) by mouth 2 (two) times daily with a meal. Patient not taking: No sig reported 11/12/23   Rasch, Delon FERNS, NP    Family History Family History  Problem Relation Age of Onset   Diabetes Maternal Grandfather    Diabetes Father    Prostate cancer Father    Hypertension Mother    Breast cancer Other     Social History Social History[1]   Allergies   Penicillin g, Penicillins, and Tramadol  hcl   Review of Systems Review of Systems  Constitutional:  Negative for chills and fever.  HENT:  Negative for ear pain and sore throat.   Eyes:  Negative for pain and visual disturbance.  Respiratory:  Negative for cough and shortness of breath.   Cardiovascular:  Negative for chest pain and palpitations.  Gastrointestinal:  Negative for abdominal pain and vomiting.  Genitourinary:  Negative for dysuria and hematuria.  Musculoskeletal:  Negative for  arthralgias and back pain.  Skin:  Positive for rash (With itching). Negative for color change.  Neurological:  Negative for seizures and syncope.  All other systems reviewed and are negative.    Physical Exam Triage Vital Signs ED Triage Vitals  Encounter Vitals Group     BP 03/17/24 1938 (!) 152/79     Girls Systolic BP Percentile --      Girls Diastolic BP Percentile --      Boys Systolic BP Percentile --      Boys Diastolic BP Percentile --      Pulse Rate 03/17/24 1938 75     Resp 03/17/24 1938 16     Temp 03/17/24 1938 98.1 F (36.7 C)     Temp Source 03/17/24 1938 Oral     SpO2 03/17/24 1938 98 %     Weight --      Height --      Head Circumference --      Peak Flow --      Pain Score 03/17/24 1940 0     Pain Loc --      Pain Education --      Exclude from Growth Chart --    No data found.  Updated Vital Signs BP (!) 152/79   Pulse 75   Temp 98.1 F (36.7 C) (Oral)   Resp 16   LMP 02/20/2024 (Approximate)   SpO2 98%   Visual Acuity Right Eye Distance:   Left Eye Distance:   Bilateral Distance:    Right Eye Near:   Left Eye Near:    Bilateral Near:     Physical Exam Vitals and nursing note reviewed.  Constitutional:      General: She is not in acute distress.    Appearance: She is well-developed.  HENT:     Head: Normocephalic and atraumatic.  Eyes:     Conjunctiva/sclera: Conjunctivae normal.  Cardiovascular:     Rate and Rhythm: Normal rate and regular rhythm.     Heart sounds: No murmur heard. Pulmonary:     Effort: Pulmonary effort is normal. No respiratory distress.     Breath sounds: Normal breath sounds.  Abdominal:     Palpations: Abdomen is soft.     Tenderness: There is no abdominal tenderness.  Musculoskeletal:        General: No swelling.     Cervical back: Neck supple.  Skin:    General: Skin is warm and dry.     Capillary Refill:  Capillary refill takes less than 2 seconds.     Findings: Rash present. Rash is papular.      Comments: Arms, legs, back with papular rash with urticarial type changes  Neurological:     Mental Status: She is alert.  Psychiatric:        Mood and Affect: Mood normal.      UC Treatments / Results  Labs (all labs ordered are listed, but only abnormal results are displayed) Labs Reviewed - No data to display  EKG   Radiology No results found.  Procedures Procedures (including critical care time)  Medications Ordered in UC Medications  methylPREDNISolone  sodium succinate (SOLU-MEDROL ) 125 mg/2 mL injection 80 mg (has no administration in time range)    Initial Impression / Assessment and Plan / UC Course  I have reviewed the triage vital signs and the nursing notes.  Pertinent labs & imaging results that were available during my care of the patient were reviewed by me and considered in my medical decision making (see chart for details).     Allergic reaction, initial encounter  Urticaria  Allergic dermatitis due to other chemical product   Symptoms and physical exam findings are consistent with an allergic reaction causing allergic dermatitis. Due to the progressive worsening and diffuseness of the symptoms we will treat with a steroid injection as well as topical steroids.  We will treat with the following: Medrol  injection given today. This is a steroid to help with swelling and allergic reaction.  Triamcinolone  cream twice daily to the affected area as needed for itching/rash.  Do not apply this to the neck or face.  Hydroxyzine  25 mg every 6 hours as needed for itching.  Use caution as this medication can cause some drowsiness. Use mild soaps and dye free products to help reduce the risk of further reaction Return to urgent care or PCP if symptoms worsen or fail to resolve.    Final Clinical Impressions(s) / UC Diagnoses   Final diagnoses:  Allergic reaction, initial encounter  Urticaria  Allergic dermatitis due to other chemical product     Discharge  Instructions      Symptoms and physical exam findings are consistent with an allergic reaction causing allergic dermatitis. Due to the progressive worsening and diffuseness of the symptoms we will treat with a steroid injection as well as topical steroids.  We will treat with the following: Medrol  injection given today. This is a steroid to help with swelling and allergic reaction.  Triamcinolone  cream twice daily to the affected area as needed for itching/rash.  Do not apply this to the neck or face.  Hydroxyzine  25 mg every 6 hours as needed for itching.  Use caution as this medication can cause some drowsiness. Use mild soaps and dye free products to help reduce the risk of further reaction Return to urgent care or PCP if symptoms worsen or fail to resolve.      ED Prescriptions   None    PDMP not reviewed this encounter.    [1]  Social History Tobacco Use   Smoking status: Never   Smokeless tobacco: Never  Vaping Use   Vaping status: Never Used  Substance Use Topics   Alcohol use: Not Currently    Alcohol/week: 0.0 standard drinks of alcohol   Drug use: No     Teresa Almarie LABOR, DEVONNA 03/17/24 2004

## 2024-04-13 ENCOUNTER — Encounter: Admitting: Medical-Surgical

## 2024-04-13 DIAGNOSIS — E611 Iron deficiency: Secondary | ICD-10-CM | POA: Insufficient documentation

## 2024-04-13 NOTE — Patient Instructions (Incomplete)
 Preventive Care 45-45 Years Old, Female  Preventive care refers to lifestyle choices and visits with your health care provider that can promote health and wellness. Preventive care visits are also called wellness exams.  What can I expect for my preventive care visit?  Counseling  Your health care provider may ask you questions about your:  Medical history, including:  Past medical problems.  Family medical history.  Pregnancy history.  Current health, including:  Menstrual cycle.  Method of birth control.  Emotional well-being.  Home life and relationship well-being.  Sexual activity and sexual health.  Lifestyle, including:  Alcohol, nicotine or tobacco, and drug use.  Access to firearms.  Diet, exercise, and sleep habits.  Work and work Astronomer.  Sunscreen use.  Safety issues such as seatbelt and bike helmet use.  Physical exam  Your health care provider will check your:  Height and weight. These may be used to calculate your BMI (body mass index). BMI is a measurement that tells if you are at a healthy weight.  Waist circumference. This measures the distance around your waistline. This measurement also tells if you are at a healthy weight and may help predict your risk of certain diseases, such as type 2 diabetes and high blood pressure.  Heart rate and blood pressure.  Body temperature.  Skin for abnormal spots.  What immunizations do I need?    Vaccines are usually given at various ages, according to a schedule. Your health care provider will recommend vaccines for you based on your age, medical history, and lifestyle or other factors, such as travel or where you work.  What tests do I need?  Screening  Your health care provider may recommend screening tests for certain conditions. This may include:  Lipid and cholesterol levels.  Diabetes screening. This is done by checking your blood sugar (glucose) after you have not eaten for a while (fasting).  Pelvic exam and Pap test.  Hepatitis B test.  Hepatitis C  test.  HIV (human immunodeficiency virus) test.  STI (sexually transmitted infection) testing, if you are at risk.  Lung cancer screening.  Colorectal cancer screening.  Mammogram. Talk with your health care provider about when you should start having regular mammograms. This may depend on whether you have a family history of breast cancer.  BRCA-related cancer screening. This may be done if you have a family history of breast, ovarian, tubal, or peritoneal cancers.  Bone density scan. This is done to screen for osteoporosis.  Talk with your health care provider about your test results, treatment options, and if necessary, the need for more tests.  Follow these instructions at home:  Eating and drinking    Eat a diet that includes fresh fruits and vegetables, whole grains, lean protein, and low-fat dairy products.  Take vitamin and mineral supplements as recommended by your health care provider.  Do not drink alcohol if:  Your health care provider tells you not to drink.  You are pregnant, may be pregnant, or are planning to become pregnant.  If you drink alcohol:  Limit how much you have to 0-1 drink a day.  Know how much alcohol is in your drink. In the [place] one drink equals one 12 oz bottle of beer (355 mL), one 5 oz glass of wine (148 mL), or one 1 oz glass of hard liquor (44 mL).  Lifestyle  Brush your teeth every morning and night with fluoride toothpaste. Floss one time each day.  Exercise for at least  30 minutes 5 or more days each week.  Do not use any products that contain nicotine or tobacco. These products include cigarettes, chewing tobacco, and vaping devices, such as e-cigarettes. If you need help quitting, ask your health care provider.  Do not use drugs.  If you are sexually active, practice safe sex. Use a condom or other form of protection to prevent STIs.  If you do not wish to become pregnant, use a form of birth control. If you plan to become pregnant, see your health care provider for a  prepregnancy visit.  Take aspirin only as told by your health care provider. Make sure that you understand how much to take and what form to take. Work with your health care provider to find out whether it is safe and beneficial for you to take aspirin daily.  Find healthy ways to manage stress, such as:  Meditation, yoga, or listening to music.  Journaling.  Talking to a trusted person.  Spending time with friends and family.  Minimize exposure to UV radiation to reduce your risk of skin cancer.  Safety  Always wear your seat belt while driving or riding in a vehicle.  Do not drive:  If you have been drinking alcohol. Do not ride with someone who has been drinking.  When you are tired or distracted.  While texting.  If you have been using any mind-altering substances or drugs.  Wear a helmet and other protective equipment during sports activities.  If you have firearms in your house, make sure you follow all gun safety procedures.  Seek help if you have been physically or sexually abused.  What's next?  Visit your health care provider once a year for an annual wellness visit.  Ask your health care provider how often you should have your eyes and teeth checked.  Stay up to date on all vaccines.  This information is not intended to replace advice given to you by your health care provider. Make sure you discuss any questions you have with your health care provider.  Document Revised: 09/14/2020 Document Reviewed: 09/14/2020  Elsevier Patient Education  2024 [employer].

## 2024-04-14 ENCOUNTER — Ambulatory Visit: Admitting: Medical-Surgical

## 2024-04-14 ENCOUNTER — Encounter: Payer: Self-pay | Admitting: Medical-Surgical

## 2024-04-14 VITALS — BP 129/76 | HR 68 | Temp 99.5°F | Resp 18 | Ht 63.0 in | Wt 364.0 lb

## 2024-04-14 DIAGNOSIS — Z6841 Body Mass Index (BMI) 40.0 and over, adult: Secondary | ICD-10-CM

## 2024-04-14 DIAGNOSIS — Z0000 Encounter for general adult medical examination without abnormal findings: Secondary | ICD-10-CM | POA: Diagnosis not present

## 2024-04-14 DIAGNOSIS — R7303 Prediabetes: Secondary | ICD-10-CM

## 2024-04-14 DIAGNOSIS — E611 Iron deficiency: Secondary | ICD-10-CM

## 2024-04-14 DIAGNOSIS — M542 Cervicalgia: Secondary | ICD-10-CM

## 2024-04-14 LAB — POCT GLYCOSYLATED HEMOGLOBIN (HGB A1C): Hemoglobin A1C: 5.7 % — AB (ref 4.0–5.6)

## 2024-04-14 MED ORDER — CYCLOBENZAPRINE HCL 10 MG PO TABS: 5.0000 mg | ORAL_TABLET | Freq: Three times a day (TID) | ORAL | 0 refills | Status: AC | PRN

## 2024-04-14 MED ORDER — PREDNISONE 20 MG PO TABS: ORAL_TABLET | ORAL | 0 refills | Status: AC
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
## 2024-04-15 ENCOUNTER — Ambulatory Visit: Payer: Self-pay | Admitting: Medical-Surgical

## 2024-04-15 DIAGNOSIS — E611 Iron deficiency: Secondary | ICD-10-CM

## 2024-04-15 LAB — COMPREHENSIVE METABOLIC PANEL WITH GFR
ALT: 12 IU/L (ref 0–32)
AST: 15 IU/L (ref 0–40)
Albumin: 4.2 g/dL (ref 3.9–4.9)
Alkaline Phosphatase: 45 IU/L (ref 41–116)
BUN/Creatinine Ratio: 18 (ref 9–23)
BUN: 14 mg/dL (ref 6–24)
Bilirubin Total: 0.3 mg/dL (ref 0.0–1.2)
CO2: 21 mmol/L (ref 20–29)
Calcium: 8.9 mg/dL (ref 8.7–10.2)
Chloride: 103 mmol/L (ref 96–106)
Creatinine, Ser: 0.76 mg/dL (ref 0.57–1.00)
Globulin, Total: 2.6 g/dL (ref 1.5–4.5)
Glucose: 90 mg/dL (ref 70–99)
Potassium: 4.1 mmol/L (ref 3.5–5.2)
Sodium: 139 mmol/L (ref 134–144)
Total Protein: 6.8 g/dL (ref 6.0–8.5)
eGFR: 99 mL/min/1.73

## 2024-04-15 LAB — CBC WITH DIFFERENTIAL/PLATELET
Basophils Absolute: 0 x10E3/uL (ref 0.0–0.2)
Basos: 0 %
EOS (ABSOLUTE): 0.2 x10E3/uL (ref 0.0–0.4)
Eos: 3 %
Hematocrit: 38 % (ref 34.0–46.6)
Hemoglobin: 11.4 g/dL (ref 11.1–15.9)
Immature Grans (Abs): 0 x10E3/uL (ref 0.0–0.1)
Immature Granulocytes: 0 %
Lymphocytes Absolute: 2 x10E3/uL (ref 0.7–3.1)
Lymphs: 26 %
MCH: 23.9 pg — ABNORMAL LOW (ref 26.6–33.0)
MCHC: 30 g/dL — ABNORMAL LOW (ref 31.5–35.7)
MCV: 80 fL (ref 79–97)
Monocytes Absolute: 0.4 x10E3/uL (ref 0.1–0.9)
Monocytes: 5 %
Neutrophils Absolute: 5.1 x10E3/uL (ref 1.4–7.0)
Neutrophils: 66 %
Platelets: 252 x10E3/uL (ref 150–450)
RBC: 4.77 x10E6/uL (ref 3.77–5.28)
RDW: 17.7 % — ABNORMAL HIGH (ref 11.7–15.4)
WBC: 7.8 x10E3/uL (ref 3.4–10.8)

## 2024-04-15 LAB — LIPID PANEL
Chol/HDL Ratio: 3.1 ratio (ref 0.0–4.4)
Cholesterol, Total: 185 mg/dL (ref 100–199)
HDL: 59 mg/dL
LDL Chol Calc (NIH): 108 mg/dL — ABNORMAL HIGH (ref 0–99)
Triglycerides: 98 mg/dL (ref 0–149)
VLDL Cholesterol Cal: 18 mg/dL (ref 5–40)

## 2024-04-15 LAB — IRON,TIBC AND FERRITIN PANEL
Ferritin: 24 ng/mL (ref 15–150)
Iron Saturation: 8 % — CL (ref 15–55)
Iron: 28 ug/dL (ref 27–159)
Total Iron Binding Capacity: 351 ug/dL (ref 250–450)
UIBC: 323 ug/dL (ref 131–425)

## 2024-04-16 ENCOUNTER — Ambulatory Visit: Admitting: Physical Therapy

## 2024-04-20 ENCOUNTER — Encounter: Payer: Self-pay | Admitting: Rehabilitative and Restorative Service Providers"

## 2024-04-20 ENCOUNTER — Ambulatory Visit: Attending: Medical-Surgical | Admitting: Rehabilitative and Restorative Service Providers"

## 2024-04-20 ENCOUNTER — Other Ambulatory Visit: Payer: Self-pay

## 2024-04-20 DIAGNOSIS — M6281 Muscle weakness (generalized): Secondary | ICD-10-CM | POA: Diagnosis present

## 2024-04-20 DIAGNOSIS — M542 Cervicalgia: Secondary | ICD-10-CM | POA: Diagnosis present

## 2024-04-20 DIAGNOSIS — R293 Abnormal posture: Secondary | ICD-10-CM | POA: Insufficient documentation

## 2024-04-20 DIAGNOSIS — M62838 Other muscle spasm: Secondary | ICD-10-CM | POA: Diagnosis present
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
## 2024-04-22 ENCOUNTER — Ambulatory Visit: Admitting: Physical Therapy
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
## 2024-04-27 ENCOUNTER — Ambulatory Visit: Admitting: Physical Therapy

## 2024-05-04 ENCOUNTER — Ambulatory Visit

## 2024-05-06 ENCOUNTER — Ambulatory Visit

## 2024-05-06 ENCOUNTER — Telehealth: Payer: Self-pay

## 2024-05-06 NOTE — Telephone Encounter (Signed)
PT called pt due to no-show appt today.  Left voicemail.

## 2024-05-07 ENCOUNTER — Telehealth: Payer: Self-pay

## 2024-05-07 DIAGNOSIS — K219 Gastro-esophageal reflux disease without esophagitis: Secondary | ICD-10-CM

## 2024-05-07 NOTE — Progress Notes (Signed)
 Complex Care Management Note  Care Guide Note 05/07/2024 Name: UNDRA TREMBATH MRN: [id number] DOB: 05-18-1980  Glennis LOISE Sharps is a 44 y.o. year old female who sees Willo Mini, NP for primary care. I reached out to Glennis LOISE Sharps by phone today to offer complex care management services.  Ms. Wawrzyniak was given information about Complex Care Management services today including:   The Complex Care Management services include support from the care team which includes your Nurse Care Manager, Clinical Social Worker, or Pharmacist.  The Complex Care Management team is here to help remove barriers to the health concerns and goals most important to you. Complex Care Management services are voluntary, and the patient may decline or stop services at any time by request to their care team member.   Complex Care Management Consent Status: Patient agreed to services and verbal consent obtained.   Follow up plan:  Telephone appointment with complex care management team member scheduled for:  05/26/2024  Encounter Outcome:  Patient Scheduled   Jon Colt [facility]  [facility] Guide, Phone: [phone] Fax: [phone] Website: [email or web address]

## 2024-05-11 ENCOUNTER — Inpatient Hospital Stay

## 2024-05-11 ENCOUNTER — Inpatient Hospital Stay: Admitting: Family

## 2024-05-11 ENCOUNTER — Ambulatory Visit: Admitting: Physical Therapy

## 2024-05-13 ENCOUNTER — Ambulatory Visit

## 2024-05-18 ENCOUNTER — Ambulatory Visit: Admitting: Physical Therapy

## 2024-05-20 ENCOUNTER — Ambulatory Visit: Admitting: Physical Therapy

## 2024-05-25 ENCOUNTER — Ambulatory Visit

## 2024-05-26 ENCOUNTER — Telehealth: Admitting: Licensed Clinical Social Worker

## 2024-05-27 ENCOUNTER — Ambulatory Visit: Admitting: Physical Therapy

## 2024-06-01 ENCOUNTER — Ambulatory Visit: Admitting: Physical Therapy

## 2024-06-03 ENCOUNTER — Ambulatory Visit: Admitting: Physical Therapy

## 2024-06-08 ENCOUNTER — Ambulatory Visit: Admitting: Physical Therapy

## 2024-06-10 ENCOUNTER — Ambulatory Visit: Admitting: Physical Therapy

## 2024-06-15 ENCOUNTER — Ambulatory Visit: Admitting: Physical Therapy

## 2024-07-10 ENCOUNTER — Other Ambulatory Visit

## 2024-10-13 ENCOUNTER — Ambulatory Visit: Admitting: Medical-Surgical
